# Patient Record
Sex: Female | Born: 1970 | Race: White | Hispanic: No | Marital: Married | Smoking: Never smoker
Health system: Southern US, Community
[De-identification: ages and names within clinical notes are randomized; demographics above are authoritative.]

## PROBLEM LIST (undated history)

## (undated) DIAGNOSIS — J329 Chronic sinusitis, unspecified: Secondary | ICD-10-CM

## (undated) DIAGNOSIS — J342 Deviated nasal septum: Secondary | ICD-10-CM

## (undated) DIAGNOSIS — R51 Headache: Secondary | ICD-10-CM

## (undated) DIAGNOSIS — K219 Gastro-esophageal reflux disease without esophagitis: Secondary | ICD-10-CM

## (undated) DIAGNOSIS — E785 Hyperlipidemia, unspecified: Secondary | ICD-10-CM

## (undated) DIAGNOSIS — T753XXA Motion sickness, initial encounter: Secondary | ICD-10-CM

## (undated) DIAGNOSIS — R519 Headache, unspecified: Secondary | ICD-10-CM

## (undated) DIAGNOSIS — I10 Essential (primary) hypertension: Secondary | ICD-10-CM

## (undated) DIAGNOSIS — T7840XA Allergy, unspecified, initial encounter: Secondary | ICD-10-CM

## (undated) DIAGNOSIS — E669 Obesity, unspecified: Secondary | ICD-10-CM

## (undated) DIAGNOSIS — J343 Hypertrophy of nasal turbinates: Secondary | ICD-10-CM

## (undated) DIAGNOSIS — G8929 Other chronic pain: Secondary | ICD-10-CM

## (undated) DIAGNOSIS — IMO0001 Reserved for inherently not codable concepts without codable children: Secondary | ICD-10-CM

## (undated) HISTORY — DX: Other chronic pain: G89.29

## (undated) HISTORY — PX: WISDOM TOOTH EXTRACTION: SHX21

## (undated) HISTORY — PX: COLONOSCOPY: SHX174

## (undated) HISTORY — DX: Headache: R51

## (undated) HISTORY — DX: Allergy, unspecified, initial encounter: T78.40XA

## (undated) HISTORY — DX: Gastro-esophageal reflux disease without esophagitis: K21.9

## (undated) HISTORY — DX: Headache, unspecified: R51.9

## (undated) HISTORY — DX: Hyperlipidemia, unspecified: E78.5

---

## 1998-02-13 ENCOUNTER — Other Ambulatory Visit: Admission: RE | Admit: 1998-02-13 | Discharge: 1998-02-13 | Payer: Self-pay | Admitting: Obstetrics and Gynecology

## 1998-04-15 ENCOUNTER — Encounter: Admission: RE | Admit: 1998-04-15 | Discharge: 1998-07-14 | Payer: Self-pay | Admitting: Internal Medicine

## 1999-02-23 ENCOUNTER — Other Ambulatory Visit: Admission: RE | Admit: 1999-02-23 | Discharge: 1999-02-23 | Payer: Self-pay | Admitting: Obstetrics and Gynecology

## 2000-02-01 ENCOUNTER — Other Ambulatory Visit: Admission: RE | Admit: 2000-02-01 | Discharge: 2000-02-01 | Payer: Self-pay | Admitting: Obstetrics and Gynecology

## 2000-08-18 ENCOUNTER — Inpatient Hospital Stay (HOSPITAL_COMMUNITY): Admission: AD | Admit: 2000-08-18 | Discharge: 2000-08-18 | Payer: Self-pay | Admitting: Obstetrics and Gynecology

## 2000-08-23 ENCOUNTER — Inpatient Hospital Stay (HOSPITAL_COMMUNITY): Admission: AD | Admit: 2000-08-23 | Discharge: 2000-08-26 | Payer: Self-pay | Admitting: Obstetrics and Gynecology

## 2000-09-25 ENCOUNTER — Other Ambulatory Visit: Admission: RE | Admit: 2000-09-25 | Discharge: 2000-09-25 | Payer: Self-pay | Admitting: Obstetrics and Gynecology

## 2001-10-29 ENCOUNTER — Other Ambulatory Visit: Admission: RE | Admit: 2001-10-29 | Discharge: 2001-10-29 | Payer: Self-pay | Admitting: Obstetrics and Gynecology

## 2002-11-06 ENCOUNTER — Other Ambulatory Visit: Admission: RE | Admit: 2002-11-06 | Discharge: 2002-11-06 | Payer: Self-pay | Admitting: Obstetrics and Gynecology

## 2003-11-25 ENCOUNTER — Other Ambulatory Visit: Admission: RE | Admit: 2003-11-25 | Discharge: 2003-11-25 | Payer: Self-pay | Admitting: Obstetrics and Gynecology

## 2004-10-26 ENCOUNTER — Ambulatory Visit: Payer: Self-pay | Admitting: Internal Medicine

## 2005-02-23 ENCOUNTER — Other Ambulatory Visit: Admission: RE | Admit: 2005-02-23 | Discharge: 2005-02-23 | Payer: Self-pay | Admitting: Obstetrics and Gynecology

## 2005-11-03 ENCOUNTER — Ambulatory Visit: Payer: Self-pay | Admitting: Family Medicine

## 2005-11-18 ENCOUNTER — Ambulatory Visit: Payer: Self-pay | Admitting: Family Medicine

## 2005-12-20 ENCOUNTER — Ambulatory Visit: Payer: Self-pay | Admitting: Internal Medicine

## 2006-05-12 LAB — CONVERTED CEMR LAB: Pap Smear: NORMAL

## 2006-08-15 ENCOUNTER — Ambulatory Visit: Payer: Self-pay | Admitting: Family Medicine

## 2006-08-15 DIAGNOSIS — F320 Major depressive disorder, single episode, mild: Secondary | ICD-10-CM | POA: Insufficient documentation

## 2006-08-15 DIAGNOSIS — R5381 Other malaise: Secondary | ICD-10-CM | POA: Insufficient documentation

## 2006-08-15 DIAGNOSIS — L659 Nonscarring hair loss, unspecified: Secondary | ICD-10-CM | POA: Insufficient documentation

## 2006-08-15 DIAGNOSIS — R5383 Other fatigue: Secondary | ICD-10-CM | POA: Insufficient documentation

## 2006-08-16 ENCOUNTER — Encounter: Payer: Self-pay | Admitting: Family Medicine

## 2006-08-16 LAB — CONVERTED CEMR LAB: Anti Nuclear Antibody(ANA): NEGATIVE

## 2006-08-22 ENCOUNTER — Ambulatory Visit: Payer: Self-pay | Admitting: Family Medicine

## 2007-05-03 ENCOUNTER — Encounter: Payer: Self-pay | Admitting: Family Medicine

## 2007-05-03 DIAGNOSIS — G43009 Migraine without aura, not intractable, without status migrainosus: Secondary | ICD-10-CM | POA: Insufficient documentation

## 2007-05-03 DIAGNOSIS — J309 Allergic rhinitis, unspecified: Secondary | ICD-10-CM | POA: Insufficient documentation

## 2007-05-11 ENCOUNTER — Ambulatory Visit: Payer: Self-pay | Admitting: Family Medicine

## 2007-05-11 DIAGNOSIS — Z6839 Body mass index (BMI) 39.0-39.9, adult: Secondary | ICD-10-CM | POA: Insufficient documentation

## 2007-05-11 DIAGNOSIS — Z6837 Body mass index (BMI) 37.0-37.9, adult: Secondary | ICD-10-CM | POA: Insufficient documentation

## 2007-05-11 DIAGNOSIS — J069 Acute upper respiratory infection, unspecified: Secondary | ICD-10-CM | POA: Insufficient documentation

## 2007-05-11 DIAGNOSIS — E66812 Obesity, class 2: Secondary | ICD-10-CM | POA: Insufficient documentation

## 2007-05-16 ENCOUNTER — Ambulatory Visit: Payer: Self-pay | Admitting: Family Medicine

## 2007-05-24 LAB — CONVERTED CEMR LAB
AST: 24 units/L (ref 0–37)
Bilirubin, Direct: 0.1 mg/dL (ref 0.0–0.3)
Cholesterol: 252 mg/dL (ref 0–200)
Direct LDL: 172.2 mg/dL
GFR calc Af Amer: 90 mL/min
GFR calc non Af Amer: 74 mL/min
Glucose, Bld: 94 mg/dL (ref 70–99)
HDL: 51.7 mg/dL (ref >39.0)
Sodium: 139 meq/L (ref 135–145)
Total CHOL/HDL Ratio: 4.9
Triglycerides: 114 mg/dL (ref 0–149)

## 2007-07-04 ENCOUNTER — Telehealth: Payer: Self-pay | Admitting: Family Medicine

## 2007-11-27 ENCOUNTER — Telehealth: Payer: Self-pay | Admitting: Family Medicine

## 2008-01-22 ENCOUNTER — Encounter: Payer: Self-pay | Admitting: Family Medicine

## 2008-02-18 ENCOUNTER — Telehealth: Payer: Self-pay | Admitting: Family Medicine

## 2008-04-02 ENCOUNTER — Ambulatory Visit: Payer: Self-pay | Admitting: Family Medicine

## 2008-04-02 DIAGNOSIS — R071 Chest pain on breathing: Secondary | ICD-10-CM | POA: Insufficient documentation

## 2008-07-14 ENCOUNTER — Telehealth: Payer: Self-pay | Admitting: Family Medicine

## 2009-05-12 LAB — CONVERTED CEMR LAB: Pap Smear: NORMAL

## 2009-07-30 ENCOUNTER — Ambulatory Visit: Payer: Self-pay | Admitting: Family Medicine

## 2009-07-30 DIAGNOSIS — K219 Gastro-esophageal reflux disease without esophagitis: Secondary | ICD-10-CM | POA: Insufficient documentation

## 2009-11-23 ENCOUNTER — Ambulatory Visit: Payer: Self-pay | Admitting: Family Medicine

## 2010-02-22 ENCOUNTER — Telehealth: Payer: Self-pay | Admitting: Family Medicine

## 2010-02-23 ENCOUNTER — Encounter: Payer: Self-pay | Admitting: Family Medicine

## 2010-03-09 ENCOUNTER — Ambulatory Visit: Payer: Self-pay | Admitting: Family Medicine

## 2010-03-09 ENCOUNTER — Telehealth: Payer: Self-pay | Admitting: Family Medicine

## 2010-05-13 NOTE — Assessment & Plan Note (Signed)
Summary: acid reflux/dlo 15 min   Vital Signs:  Patient profile:   40 year old female Height:      66.5 inches Weight:      221.8 pounds BMI:     35.39 Temp:     98.6 degrees F oral Pulse rate:   76 / minute Pulse rhythm:   regular BP sitting:   118 / 80  (left arm) Cuff size:   large  Vitals Entered By: Benny Lennert CMA Duncan Dull) (July 30, 2009 9:03 AM)  History of Present Illness: Chief complaint acid reflux    Heartburn      This is a 40 year old woman who presents with Heartburn.  The patient complains of acid reflux, sour taste in mouth, and chest pain, but denies epigastric pain.  The patient denies the following alarm features: melena, dysphagia, hematemesis, and vomiting.  Symptoms are worse with lying down.     ROS: GEN: No acute illnesses, no fevers, chills, sweats, fatigue, weight loss, or URI sx. Pulm: No SOB, cough, wheezing has occ B arm tingling Interactive and getting along well at home.  Otherwise, ROS is as per the HPI.   GEN: Well-developed,well-nourished,in no acute distress; alert,appropriate and cooperative throughout examination HEENT: Normocephalic and atraumatic without obvious abnormalities. No apparent alopecia or balding. Ears, externally no deformities ABD: s, nt, nd, +bs, no rebound PULM: Breathing comfortably in no respiratory distress EXT: No clubbing, cyanosis, or edema PSYCH: Normally interactive. Cooperative during the interview. Pleasant. Friendly and conversant. Not anxious or depressed appearing. Normal, full affect.   Allergies (verified): No Known Drug Allergies   Impression & Recommendations:  Problem # 1:  GERD (ICD-530.81) Zantac first if not working, then prilosec or prevacid reviewed diet and mechanical changes  Diagnostics Reviewed:  Discussed lifestyle modifications, diet, antacids/medications, and preventive measures. Handout provided.   Complete Medication List: 1)  Multivitamins Tabs (Multiple vitamin) ....  (sometimes) 2)  Nasonex 50 Mcg/act Susp (Mometasone furoate) .... Daily 3)  Lo/ovral 0.3-30 Mg-mcg Tabs (Norgestrel-ethinyl estradiol) .... Once daily 4)  Claritin 10 Mg Tabs (Loratadine) .... Daily  Current Allergies (reviewed today): No known allergies

## 2010-05-13 NOTE — Medication Information (Signed)
Summary: Request for Prior Authorization-Omeprazole  Request for Prior Authorization-Omeprazole   Imported By: Maryln Gottron 04/15/2010 12:28:55  _____________________________________________________________________  External Attachment:    Type:   Image     Comment:   External Document

## 2010-05-13 NOTE — Assessment & Plan Note (Signed)
Summary: F/U [facility] ER ON 11/02/09/[facility]   Vital Signs:  Patient profile:   40 year old female Height:      66.5 inches Weight:      226.0 pounds BMI:     36.06 Temp:     98.8 degrees F oral Pulse rate:   76 / minute Pulse rhythm:   regular BP sitting:   120 / 90  (left arm) Cuff size:   large  Vitals Entered By: Benny Lennert CMA Duncan Dull) (November 23, 2009 12:05 PM)  History of Present Illness: Chief complaint follow up [facility] hospital er on 11-02-2009 (chest pain)  Seen in ER on 7/25 for chest pain.  Labs, EKG, CXR nml. LFTs nml for pt. Poor  control on ranitidine and carafate. Significant improvement with GI cocktail.  Continuing to have central chest pain...worst at night. Worse when lying down at night. Woke her up at 3:30 last night. Bloated, epigastric ttp. No exertional chest pain.   Sour taste in throat, burping acid.  No clear trigger..but last night did have [place] cooking.Marland Kitchengreasy.  Lasting 20 min at a time. No clear RUQ pain.   Pepcid AC not helping.   Problems Prior to Update: 1)  Gerd  (ICD-530.81) 2)  Chest Wall Pain, Anterior  (ICD-786.52) 3)  Screening For Lipoid Disorders  (ICD-V77.91) 4)  Uri  (ICD-465.9) 5)  Overweight  (ICD-278.02) 6)  Iron Deficiency, Childhood  (ICD-280.9) 7)  Migraine Headache  (ICD-346.90) 8)  Allergic Rhinitis  (ICD-477.9) 9)  Fatigue, Chronic  (ICD-780.79) 10)  Depressive [place]  (ICD-296.21) 11)  Hair Loss  (ICD-704.00)  Current Medications (verified): 1)  Multivitamins   Tabs (Multiple Vitamin) .... (Sometimes) 2)  Nasonex 50 Mcg/act Susp (Mometasone Furoate) .... Daily 3)  Lo/ovral 0.3-30 Mg-Mcg Tabs (Norgestrel-Ethinyl Estradiol) .... Once Daily 4)  Claritin 10 Mg Tabs (Loratadine) .... Daily 5)  Carafate 1 Gm Tabs (Sucralfate) .... Take One Tab Before Meals 6)  Omeprazole 40 Mg Cpdr (Omeprazole) .Marland Kitchen.. 1 Tab By Mouth Daily .Marland Kitchen.for 4-6 Weeks Then Taper Off.  Allergies (verified): No  Known Drug Allergies  Past History:  Past medical, surgical, family and social histories (including risk factors) reviewed, and no changes noted (except as noted below).  Past Medical History: Reviewed history from 05/03/2007 and no changes required. Allergic rhinitis  Past Surgical History: Reviewed history from 05/11/2007 and no changes required. 2002        C-Section 10/2004     Head CT (-) plantar fascia accidentally torn   Family History: Reviewed history from 05/03/2007 and no changes required. Father: Alive 77, arthritis, Elev. thyroid Mother: Alive 22's, manic depression, bipolar Siblings: 1 sister, 55 years old, ? bipolar DM:  Paternal GM Arthritis:  Father Breast CA:  Maternal GM Depression:  Bipolar (maternal family)  Social History: Reviewed history from 05/03/2007 and no changes required. Former Smoker, teen years Alcohol use-yes, occasionally Drug use-no Regular exercise-yes, occasionally Marital Status: Married x 14-1/2 years, no abuse Children: 75 year old, 38 year old Occupation: Runner, broadcasting/film/video, Kindergarten, Sports coach, BA in Education Diet:  3 meals daily, varied, no FF, social ETOH, no tob. abuse, (+) hx/complex maj.  Review of Systems       No SOB, no cough,  General:  Denies fatigue and fever. ENT:  Denies hoarseness. CV:  Denies swelling of feet. Resp:  Denies shortness of breath, sputum productive, and wheezing. GU:  Denies dysuria.  Physical Exam  General:  overweight appearing female in NAD  Mouth:  Oral mucosa and oropharynx without lesions or exudates.  Teeth in good repair. Neck:  no carotid bruit or thyromegaly no cervical or supraclavicular lymphadenopathy  Lungs:  Normal respiratory effort, chest expands symmetrically. Lungs are clear to auscultation, no crackles or wheezes. Heart:  Normal rate and regular rhythm. S1 and S2 normal without gallop, murmur, click, rub or other extra sounds. Abdomen:  ttp over epigastrum and  substernallyno distention, no masses, no guarding, no rigidity, no rebound tenderness, no abdominal hernia, no inguinal hernia, no hepatomegaly, and no splenomegaly.   Pulses:  R and L posterior tibial pulses are full and equal bilaterally  Extremities:  no edema  Skin:  Intact without suspicious lesions or rashes Psych:  Cognition and judgment appear intact. Alert and cooperative with normal attention span and concentration. No apparent delusions, illusions, hallucinations   Impression & Recommendations:  Problem # 1:  CHEST WALL PAIN, ANTERIOR (ICD-786.52) Most consistent with GERD. But if not improving with below treatment , consider gallbladder source. \Reviewed ER records in detail.Marland Kitchenno clear cardiac or pulm source.   Problem # 2:  GERD (ICD-530.81) Avoid acidic foods..caffeine, chocolate, tomato, citris, peppermint. Decrease greasy foods, fatty foods. Start omeprozole 40 mg daily  for 4-6 weeks then taper off.  If no improvement in 1-2 week make appt for [place] of gallbladder and change in medicine.  Her updated medication list for this problem includes:    Carafate 1 Gm Tabs (Sucralfate) .Marland Kitchen... Take one tab before meals    Omeprazole 40 Mg Cpdr (Omeprazole) .Marland Kitchen... 1 tab by mouth daily .Marland Kitchen.for 4-6 weeks then taper off.  Complete Medication List: 1)  Multivitamins Tabs (Multiple vitamin) .... (sometimes) 2)  Nasonex 50 Mcg/act Susp (Mometasone furoate) .... Daily 3)  Lo/ovral 0.3-30 Mg-mcg Tabs (Norgestrel-ethinyl estradiol) .... Once daily 4)  Claritin 10 Mg Tabs (Loratadine) .... Daily 5)  Carafate 1 Gm Tabs (Sucralfate) .... Take one tab before meals 6)  Omeprazole 40 Mg Cpdr (Omeprazole) .Marland Kitchen.. 1 tab by mouth daily .Marland Kitchen.for 4-6 weeks then taper off.  Patient Instructions: 1)  Avoiding acidic foods..caffeine, chocolate, tomato, citris, peppermint. 2)  Decrease greasy foods, fatty foods. 3)  Start omeprozole 40 mg daily  for 4-6 weeks then taper off.  4)  If no improvement in 1-2 week for  [place] of gallbladder and change in medicine.  5)   Fasting lipids, CMET Dx v77.91  Prescriptions: OMEPRAZOLE 40 MG CPDR (OMEPRAZOLE) 1 tab by mouth daily .Marland Kitchen.for 4-6 weeks then taper off.  #30 x 3   Entered and Authorized by:   Kerby Nora MD   Signed by:   Kerby Nora MD on 11/23/2009   Method used:   Electronically to        [id number]       [address]       [place], [place]  [address]       Ph: [phone]       Fax: [phone]   RxID:   [id number]   Current Allergies (reviewed today): No known allergies   Last PAP:  Normal (05/12/2006 3:26:48 PM) PAP Result Date:  05/12/2009 PAP Result:  normal PAP Next Due:  1 yr Last Mammogram:  Normal (04/12/2003 2:32:17 PM) Mammogram Result Date:  05/12/2009 Mammogram Result:  normal Mammogram Next Due:  1 yr

## 2010-05-13 NOTE — Progress Notes (Signed)
Summary: prior auth needed for omeprazole  Phone Note From Pharmacy   [facility]  [address] [id number]  [facility] Summary of Call: Prior Berkley Harvey is needed for omeprazole, form is on your desk. Initial call taken by: Lowella Petties CMA, AAMA,  February 22, 2010 3:44 PM     Appended Document: prior auth needed for omeprazole Spoke with [facility] rep today, prior Berkley Harvey was given for omeprazole, good through 02/23/11.

## 2010-05-13 NOTE — Progress Notes (Signed)
Summary: Stepped on pin (pt is here now)  Phone Note Call from Patient   Caller: Patient Call For: Kerby Nora MD Summary of Call: At 7am this morning pt put on her boot and an old [employer] uniform pin was in the boot. Pt stepped on pin which was 3/4" long. It went all the way into pt's left foot. Pt said the pin is old but she is not sure if had rust on it or not.Pt said it is sore to bear weight on her foot and is tender if she pushes around where pin went in. Pt is not sure if she needs to be seen and is not sure when her last tetanus shot was. Pt is here now. Please advise.  Initial call taken by: Lewanda Rife LPN,  March 09, 2010 4:46 PM  Follow-up for Phone Call        Tetanus given today. No DM. No prophylactic antibiotics needed.  Have pt wash area warm soapy water daily , apply bandaid/neoporin daily.  Follow up in 3 days for re-eval.  Follow up sooner if any redness or discharge at site.  Follow-up by: Kerby Nora MD,  March 09, 2010 4:54 PM  Additional Follow-up for Phone Call Additional follow up Details #1::        Patient advised as instructed while here in the office.  Tetanus shot given while here today.  Appt made for Friday with Dr. Sharen Hones at 4:15. Additional Follow-up by: Linde Gillis CMA Duncan Dull),  March 09, 2010 5:03 PM      Immunizations Administered:  Tetanus Vaccine:    Vaccine Type: Tdap    Site: left deltoid    Mfr: GlaxoSmithKline    Dose: 0.5 ml    Route: IM    Given by: Linde Gillis CMA (AAMA)    Exp. Date: 01/29/2012    Lot #: [id number]    VIS given: 02/27/08 version given March 09, 2010.

## 2010-08-27 NOTE — Discharge Summary (Signed)
[facility]  Patient:    Jane Li, Jane Li                      MRN: [id number] Adm. Date:  [id number] Disc. Date: [id number] Attending:  Rhina Brackett Dictator:   Danie Chandler, R.N.                           Discharge Summary  ADMITTING DIAGNOSES:            1. Intrauterine pregnancy at term.                                 2. Fetal macrosomia.                                 3. Declined trial of labor.  DISCHARGE DIAGNOSES:            1. Intrauterine pregnancy at term.                                 2. Fetal macrosomia.                                 3. Declined trial of labor.  PROCEDURE:                      On Aug 23, 2000, primary low transverse cesarean section.  REASON FOR ADMISSION:           Please see dictated H&P.  HOSPITAL COURSE:                The patient was taken to the operating room and underwent the above named procedure without complication.  This was productive of a viable female infant with Apgars of 8 at one minute and 9 at five minutes.  Postoperatively on day #1, the patients hemoglobin was 10.1, hematocrit 29.1 and white blood cell count 11.0.  The patient had good return of bowel function on this day and on postoperative day #2 she was tolerating a regular diet and had good pain control and was ambulating well without difficulty.  She was discharged home no postoperative day #3.  DISCHARGE CONDITION:            Good.  DIET:                           Regular as tolerated.  ACTIVITY:                       No heavy lifting, no driving, no vaginal entry.  FOLLOW-UP:                      She is to follow up in the office in one to two weeks for incision check, and she is to call for temperature greater than 100 degrees, persistent nausea and vomiting, heavy vaginal bleeding, and/or redness and drainage from the incision site.  DISCHARGE MEDICATIONS:          1. Prenatal vitamin one p.o. q.d.  2. Tylox as directed by M.D. DD:  09/07/00 TD:  09/07/00 Job: [id number]

## 2010-08-27 NOTE — Assessment & Plan Note (Signed)
[facility]                             [facility] OFFICE NOTE   Jane Li, Jane Li                      MRN:          [id number]  DATE:11/03/2005                            DOB:          [birth year]    CHIEF COMPLAINT:  A 40 year old white female here to establish a new doctor.   HISTORY OF PRESENT ILLNESS:  Jane Li had previously seen Dr. Arlana Pouch in  [place], but was unhappy with his care and would like to transfer care to me.  She states that she has multiple medical issues that she would like to  discuss that are listed below:  1.  Frequent headaches associated with occasional nausea and photophobia.      She takes ibuprofen to help this which it does some.  She states she is      having these headaches every 3 to 4 days.  She has had them for many      years, but they are bothering her significantly now.  She has never had      any diagnosis of migraine or previous migraine treatment.  See review of      systems for further details.  2.  Allergic rhinitis.  3.  Decreased energy:  Jane Li states that for over a year she has had      consistent fatigue.  She states she does not have any desire to do      things that used to be pleasurable for her.  She denies anxiety or      depression.  She does have a family history of bipolar disorder.  She      would like to discuss this in detail.  4.  Irregular bowel habits:  Jane Li stated that for the past few months      she has had alternating diarrhea with constipation.  She denies any      blood in her stool or abdominal pain or fever.   PAST MEDICAL HISTORY:  1.  Childhood iron deficiency.  2.  Allergic rhinitis.   HOSPITALIZATIONS AND SURGERIES:  1.  In 1997, a normal spontaneous vaginal delivery.  No complications.  2.  In 2002, C-section.   ALLERGIES:  NO KNOWN DRUG ALLERGIES.   MEDICATIONS:  1.  Flonase two sprays per nostril daily.  2.  Fexofenadine 180 mg daily.  3.   Lo/Ovral 21 one daily.  4.  Nature's Diet capsules one in the morning and one at lunch plus herbs.  5.  Multivitamin daily.  6.  Vitamin B complex daily.   FAMILY HISTORY:  Father alive at age 16 with arthritis and possible  hyperthyroidism.  Mother alive at age 78 with bipolar disorder.  Parental  grandmother with diabetes.  Maternal grandmother with breast cancer.  No  first-degree family members with breast cancer.  Depression and bipolar  disorder in several people in the maternal side of her family.  She has one  sister who is 64 years old with possible bipolar disorder.   SOCIAL HISTORY:  She works as a Midwife at [employer].  She has a B.A. in education.  She has been married for 14-1/2 years without  any sexual or domestic abuse.  She has two children, one 8 years old and one  78 years old.  She states that it is very stressful for her to work all day  and then come home and have to take care of her children.  Her diet is very  varied and she states that it is healthy with three meals per day.  She has  a history of social alcohol use.  No tobacco use.  Remote history of  marijuana use.  No IV drug use.   REVIEW OF SYSTEMS:  Frequent sinus and tension headaches.  She does not wear  glasses.  No change in hearing.  Occasional dyspnea.  Occasional brief chest  pain.  No cough.  Occasional nausea.  Irregular bowel habits alternating  with diarrhea and constipation.  She is G2 P2.  Dry skin.  Decreased libido.  Flooded temperature.  Joint pain diffusely and neck ache.  Rhinitis.   PHYSICAL EXAMINATION:  VITAL SIGNS:  Weight 199, blood pressure 142/100,  pulse 68, temperature 98.4, height 66-1/2, putting BMI at 32.  GENERAL:  Overweight-appearing female in no apparent distress.  Appropriate  affect.  HEENT:  PERRLA.  No papilledema.  Extraocular muscles intact.  Tympanic  membranes clear.  Oropharynx clear.  Nares patent and clear.  No  lymphadenopathy.  No  thyromegaly.  PULMONARY:  Clear to auscultation bilaterally.  No wheezes, rales or  rhonchi.  CARDIOVASCULAR:  Regular rate and rhythm.  No murmurs, rubs or gallops.  GASTROINTESTINAL:  Soft, nontender, normoactive bowel sounds.  No  hepatosplenomegaly.  MUSCULOSKELETAL:  Strength 5/5 in upper and lower extremities.  Range of  motion within normal limits.  Non-antalgic gait.  NEUROLOGIC:  Cranial nerves II-XII grossly intact.  Sensation intact in  upper and lower extremities.  Reflexes 2+ bicipital and patellar.  Pulses  2+.   ASSESSMENT:  1.  Fatigue and anhedonia:  Possible anxiety and depression.  2.  Frequent headaches:  Likely migraine.  3.  Irregular bowel habits:  Possible irritable bowel syndrome.  4.  Allergic rhinitis.  5.  Elevated blood pressure.   PLAN:  I will obtain Jane Li records from her gynecologist as well as  her previous doctor.  She will be given information on migraines and a  headache diary.  She will continue ibuprofen p.r.n. headache.  I feel that a  lot of her symptoms may be secondary to stress.  At our next visit once I  have reviewed her old records, we will consider a workup for fatigue unless  labs have been done recently.  She does have a family history of bipolar  disorder as well as thyroid  disorder, and these both have to be considered.  She will return in 2 to 3  weeks or earlier depending upon the arrival of her records.                                   Kerby Nora, MD   AB/MedQ  DD:  11/03/2005  DT:  11/03/2005  Job #:  [id number]

## 2010-08-27 NOTE — H&P (Signed)
[facility]  Patient:    Jane Li, Jane Li                        MRN: [id number] Adm. Date:  08/23/00 Attending:  Duke Salvia. Marcelle Overlie, M.D.                         History and Physical  CHIEF COMPLAINT:              Breech/transverse presentation at term, fetal macrosomia.  HISTORY OF PRESENT ILLNESS:   A 40 year old G2, P88.  EDD May 19.  This patient had a difficult first vaginal delivery of pushing three and a half to four hours with epidural, delivering a 9 pound 3 ounce female.  Was seen recently Aug 15, 2000.  Ultrasound showed AFI 95th percentile with EFW 10.2 pounds. Most of the measurements were off the chart.  Noted to be breech/transverse lie.  She declines ECV due to the suspected macrosomia and presents now for primary cesarean section.  This procedure including risks of bleeding, infection, transfusion, adjacent organ injury all reviewed with her and cesarean remains her preference.  PRENATAL LABORATORIES:        Group B strep screen was negative.  Blood type A+.  Rubella titer positive.  One hour GTT 135.  ALLERGIES:                    None.  PAST SURGICAL HISTORY:        None.  PAST OBSTETRICAL HISTORY:     One vaginal delivery in 1997 of a 9 pound 3 ounce female.  History of chlamydia treated in the past.  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  PHYSICAL EXAMINATION  VITAL SIGNS:                  Temperature 98.1, blood pressure 120/80.  HEENT:                        Unremarkable.  NECK:                         Supple without masses.  LUNGS:                        Clear.  CARDIOVASCULAR:               Regular rate and rhythm without murmurs, rubs, or gallops.  BREASTS:                      Not examined.  ABDOMEN:                      A 42 cm fundal height.  Fetal heart rate 140s.  PELVIC:                       Cervix was closed, was breech by ______.  EXTREMITIES:                  Unremarkable.  NEUROLOGIC:                    Unremarkable.  IMPRESSION:                   1. Term intrauterine pregnancy.  2. Breech presentation/transverse lie with fetal                                  macrosomia.  PLAN:                         Primary cesarean section.  Procedure and risks discussed as above. DD:  08/21/00 TD:  08/21/00 Job: [id number]

## 2010-08-27 NOTE — Op Note (Signed)
[facility]  Patient:    Jane Li, Jane Li                      MRN: [id number] Proc. Date: 08/23/00 Adm. Date:  [id number] Attending:  Rhina Brackett                           Operative Report  PREOPERATIVE DIAGNOSIS:       Term intrauterine pregnancy, fetal macrosomia,                               declined trial of labor.  POSTOPERATIVE DIAGNOSIS:      Term intrauterine pregnancy, fetal macrosomia,                               declined trial of labor.  OPERATION:                    Primary low transverse cesarean section.  SURGEON:                      Duke Salvia. Marcelle Overlie, M.D.  ANESTHESIA:                   Spinal.  COMPLICATIONS:                None.  DRAINS:                       Foley catheter.  ESTIMATED BLOOD LOSS:         800 cc.  DESCRIPTION OF PROCEDURE AND FINDINGS:                     The patient had been breech to transverse presentation with an estimated fetal weight of 10.5 pounds.  She had a long second stage with her first vaginal delivery, and was scheduled for primary cesarean section, both for concerns about macrosomia and malpresentation. Ultrasound immediately preoperative demonstrated a vertex presentation, but she again declined a trial of labor due to the estimated fetal weight.  The patient was prepped and draped in the left tilt position.  Foley catheter positioned after spinal anesthetic was obtained.  A transverse Pfannenstiel incision was made two fingerbreadths above the symphysis, and carried down to the fascia which was incised and extended transversely.  The rectus muscles were divided in the midline.  The peritoneum entered superiorly without incident and extended in a vertical manner.  The vesicouterine serosa was then incised, and the bladder was bluntly and sharply dissected off of the lower uterine segment.  The bladder blade was positioned.  A transverse incision was made in the lower segment  and extended with blunt dissection.  Clear fluid was noted.  The patient delivered of a 9 pound and 10 ounces female.  Apgars were 8 and 9.  The infant was suctioned, cord clamped, and passed to the pediatric team for further care.  The placenta delivered manually intact.  The uterus exteriorized and cavity wiped clean with laparotomy packs.  Closure obtained with first layer of 0 chromic in a locked fashion following an imbricating layer of 0 chromic.  This was hemostatic.  The bladder flap was intact and hemostatic.  Tubes and ovaries were normal.  Prior to closure, sponge,  needle, and instrument counts were reported as correct x 2.  The rectus muscles were reapproximated with a 3-0 Dexon interrupted suture.  The fascia was closed laterally to midline on either side with a 0 Dexon running suture.  The subcutaneous fat was hemostatic.  Clips and Steri-Strips were used on the skin.  She tolerated this well and went to the recovery room in good condition. Clear urine noted at the end of the case.  She received Ancef 1 g IV after the cord was clamped and Pitocin IV. DD:  08/23/00 TD:  08/23/00 Job: [id number]

## 2010-10-23 ENCOUNTER — Other Ambulatory Visit: Payer: Self-pay | Admitting: Family Medicine

## 2010-11-01 ENCOUNTER — Encounter: Payer: Self-pay | Admitting: Family Medicine

## 2010-11-02 ENCOUNTER — Ambulatory Visit (INDEPENDENT_AMBULATORY_CARE_PROVIDER_SITE_OTHER): Payer: BC Managed Care – PPO | Admitting: Family Medicine

## 2010-11-02 ENCOUNTER — Telehealth: Payer: Self-pay | Admitting: *Deleted

## 2010-11-02 ENCOUNTER — Encounter: Payer: Self-pay | Admitting: Family Medicine

## 2010-11-02 DIAGNOSIS — R071 Chest pain on breathing: Secondary | ICD-10-CM

## 2010-11-02 DIAGNOSIS — R51 Headache: Secondary | ICD-10-CM

## 2010-11-02 DIAGNOSIS — R519 Headache, unspecified: Secondary | ICD-10-CM | POA: Insufficient documentation

## 2010-11-02 DIAGNOSIS — K219 Gastro-esophageal reflux disease without esophagitis: Secondary | ICD-10-CM

## 2010-11-02 DIAGNOSIS — G43909 Migraine, unspecified, not intractable, without status migrainosus: Secondary | ICD-10-CM

## 2010-11-02 DIAGNOSIS — R42 Dizziness and giddiness: Secondary | ICD-10-CM

## 2010-11-02 MED ORDER — ALPRAZOLAM 0.5 MG PO TABS: ORAL_TABLET | ORAL | Status: DC

## 2010-11-02 MED ORDER — ESOMEPRAZOLE MAGNESIUM 40 MG PO CPDR: 40.0000 mg | DELAYED_RELEASE_CAPSULE | Freq: Every day | ORAL | Status: DC

## 2010-11-02 NOTE — Assessment & Plan Note (Signed)
Possible recurrence of migraine , but given previously migraine free for years on no medication, with gradual onset of worsening headache daily, no severe as well as dizziness, and skin sensitivity, nausea....will eval with MRI brain to rule out secondary cause of headache. Neuro exam today is normal. Also pt overusing OTC med.. Stop these to avoid rebound headache. Pt does have morning HA and snores.. If MRI nml and treatment of migraines not helping.. Consider sleep study eval for sleep apnea.

## 2010-11-02 NOTE — Progress Notes (Signed)
  Subjective:    Patient ID: Jane Li, female    DOB: 09-Nov-1970, 40 y.o.   MRN: [id number]  HPI    Review of Systems  Constitutional: Negative for fever and fatigue.  HENT: Negative for ear pain.   Eyes: Negative for pain.  Respiratory: Negative for chest tightness and shortness of breath.   Cardiovascular: Negative for chest pain, palpitations and leg swelling.  Gastrointestinal: Negative for abdominal pain.  Genitourinary: Negative for dysuria.  Neurological: Negative for dizziness, tremors, syncope, weakness, light-headedness and numbness.       Objective:   Physical Exam  Constitutional: Vital signs are normal. She appears well-developed and well-nourished. She is cooperative.  Non-toxic appearance. She does not appear ill. No distress.  HENT:  Head: Normocephalic.  Right Ear: Hearing, tympanic membrane, external ear and ear canal normal. Tympanic membrane is not erythematous, not retracted and not bulging.  Left Ear: Hearing, tympanic membrane, external ear and ear canal normal. Tympanic membrane is not erythematous, not retracted and not bulging.  Nose: No mucosal edema or rhinorrhea. Right sinus exhibits no maxillary sinus tenderness and no frontal sinus tenderness. Left sinus exhibits no maxillary sinus tenderness and no frontal sinus tenderness.  Mouth/Throat: Uvula is midline, oropharynx is clear and moist and mucous membranes are normal.  Eyes: Conjunctivae, EOM and lids are normal. Pupils are equal, round, and reactive to light. No foreign bodies found.  Neck: Trachea normal and normal range of motion. Neck supple. Carotid bruit is not present. No mass and no thyromegaly present.  Cardiovascular: Normal rate, regular rhythm, S1 normal, S2 normal, normal heart sounds, intact distal pulses and normal pulses.  Exam reveals no gallop and no friction rub.   No murmur heard. Pulmonary/Chest: Effort normal and breath sounds normal. Not tachypneic. No respiratory distress.  She has no decreased breath sounds. She has no wheezes. She has no rhonchi. She has no rales.  Abdominal: Soft. Normal appearance and bowel sounds are normal. There is no tenderness.  Neurological: She is alert. She has normal strength and normal reflexes. She displays no atrophy and no tremor. No cranial nerve deficit or sensory deficit. She exhibits normal muscle tone. She displays a negative Romberg sign. She displays no seizure activity. Coordination and gait normal. GCS eye subscore is 4. GCS verbal subscore is 5. GCS motor subscore is 6.  Skin: Skin is warm, dry and intact. No rash noted.  Psychiatric: Her speech is normal and behavior is normal. Judgment and thought content normal. Her mood appears not anxious. Cognition and memory are normal. She does not exhibit a depressed mood.          Assessment & Plan:

## 2010-11-02 NOTE — Telephone Encounter (Signed)
Patient advised and rx called to pharmacy  

## 2010-11-02 NOTE — Patient Instructions (Signed)
Stop ibuprofen. Use tylenol for headache only. Stop omeprazole, change to nexium 40 mg daily. Work on low acid diet, avoid caffeine, alcohol, citris fruit. Stop at front desk to get referral for MRI brain to evaluate headaches.  We will call you with further recommendations for headaches following results of this study.

## 2010-11-02 NOTE — Assessment & Plan Note (Signed)
Poor control. Likely due to daily ibuprofen she is taking due to headache.  Stop ibuprofen.   Stop omeprazole.. Change to pantoprazole daily. Call if symptoms not improved in 2 weeks.

## 2010-11-02 NOTE — Progress Notes (Signed)
  Subjective:    Patient ID: Jane Li, female    DOB: September 28, 1970, 40 y.o.   MRN: [id number]  HPI  40 year old presents with multiple issues.  She reports that  chect pain from GERD... intially better with PPI.  Past cardiac work up.. EKG, CXR labs.  She cannot taper off omeprazole 40 mg daily despite lifestyle changes. Symtpoms are even worse now.. Burning in chest, pain in RUQ intermitantly after meals.  Migraine headaches are back in past 4 months (hormaonal migraine had gone away previously with continuous OCPs)... More frequent, occuring daily. Using ibuprofen almost daily. Pain in head in different areas each time. Occ nausea, photo,phonophobia.  Wakes up with headache in AM. She does snore at night.. Has never had sleep study, no BP issues Neck stiffness.. Feels golf ball size swelling at base of neck... X-rays nml. Seeing chiropractor, which helps some.  Occ dizziness, no balance issues. She does feel like memory has gotten worse recently. No numbness, no weakness. She does feel like her skin is extra sensitive.  Recent nml eye exam.  Recent ENT visit shows septum deviation, no other issues.  Review of Systems     Objective:   Physical Exam        Assessment & Plan:

## 2010-11-02 NOTE — Telephone Encounter (Signed)
Patient is going for mri of the brain and is very claustrophobic and would like something called to pharmacy for this

## 2010-11-04 ENCOUNTER — Ambulatory Visit
Admission: RE | Admit: 2010-11-04 | Discharge: 2010-11-04 | Disposition: A | Discharge status: Home / Self Care | Payer: BC Managed Care – PPO | Source: Ambulatory Visit | Attending: Family Medicine | Admitting: Family Medicine

## 2010-11-04 ENCOUNTER — Telehealth: Payer: Self-pay | Admitting: Family Medicine

## 2010-11-04 DIAGNOSIS — R519 Headache, unspecified: Secondary | ICD-10-CM

## 2010-11-04 DIAGNOSIS — R42 Dizziness and giddiness: Secondary | ICD-10-CM

## 2010-11-04 MED ORDER — SUMATRIPTAN SUCCINATE 100 MG PO TABS: ORAL_TABLET | ORAL | Status: DC

## 2010-11-04 MED ORDER — TOPIRAMATE 25 MG PO TABS: ORAL_TABLET | ORAL | Status: DC

## 2010-11-04 NOTE — Telephone Encounter (Signed)
Spoke with Pt regarding MRI results.. Nml.  She will proceed with topamax for migraine prevention. Imitrex for serve acute migraine treatment . She has stopped ibuprofen, She is okay with 1 month follow up... Please call her to arrange follow up in 1 month.  If not better at that time, we can consider sleep study.

## 2010-11-05 NOTE — Telephone Encounter (Signed)
Spoke with patient and she would like to call back when she is near her calande

## 2010-11-14 ENCOUNTER — Other Ambulatory Visit: Payer: Self-pay | Admitting: Family Medicine

## 2010-12-03 ENCOUNTER — Encounter: Payer: Self-pay | Admitting: Family Medicine

## 2010-12-03 ENCOUNTER — Ambulatory Visit (INDEPENDENT_AMBULATORY_CARE_PROVIDER_SITE_OTHER): Payer: BC Managed Care – PPO | Admitting: Family Medicine

## 2010-12-03 VITALS — BP 120/72 | HR 76 | Temp 98.4°F | Ht 67.0 in | Wt 226.1 lb

## 2010-12-03 DIAGNOSIS — R51 Headache: Secondary | ICD-10-CM

## 2010-12-03 DIAGNOSIS — G43909 Migraine, unspecified, not intractable, without status migrainosus: Secondary | ICD-10-CM

## 2010-12-03 DIAGNOSIS — R1013 Epigastric pain: Secondary | ICD-10-CM

## 2010-12-03 DIAGNOSIS — R519 Headache, unspecified: Secondary | ICD-10-CM

## 2010-12-03 LAB — COMPREHENSIVE METABOLIC PANEL
BUN: 13 mg/dL (ref 6–23)
CO2: 25 mEq/L (ref 19–32)
Creatinine, Ser: 0.9 mg/dL (ref 0.4–1.2)
GFR: 70.97 mL/min (ref >60.00)
Glucose, Bld: 96 mg/dL (ref 70–99)
Total Bilirubin: 0.3 mg/dL (ref 0.3–1.2)

## 2010-12-03 LAB — LIPASE: Lipase: 31 U/L (ref 11.0–59.0)

## 2010-12-03 MED ORDER — TOPIRAMATE 50 MG PO TABS: 50.0000 mg | ORAL_TABLET | Freq: Every day | ORAL | Status: DC

## 2010-12-03 NOTE — Patient Instructions (Addendum)
Increase topamax to 50 mg at bedtime for migraine. Use imitrex for acute severe headache. Stop by lab on your way out. Okay to change back to omeprazole 40 mg daily if cheaper option and working as well as nexium.

## 2010-12-03 NOTE — Assessment & Plan Note (Signed)
Continue trigger avoidance...increase topamax to 50 mg daily. Follow up in 1 month.

## 2010-12-03 NOTE — Progress Notes (Signed)
  Subjective:    Patient ID: Jane Li, female    DOB: Dec 30, 1970, 40 y.o.   MRN: [id number]  HPI  Migraine: Nml MRI last month. We discussed holding ibuprofen as they may be causing GERD and stomach irritation as well as rebound headache. She has started back  topamax 25 mg daily about 20 days. NO SE associated, taking at night. She reports headaches since then occuring less frequently. Having breaks of a week between headaches. Using imitrex for acute headache, made headache more manageable, second dose helped it resolve. HAs increased exercsie.  GERD.Marland Kitchen She has not noted any  improved symptoms off ibuprofen and on Nexium x 1 month. Omeprazole 40 did not help either.  Nexium was costly.. Would like different alternative. Continued burning in chest, sour taste in mouth, occ feels like food moving difficulty through chest. Has been trying to avoid triggers.  Still having some epigastric pain...constantly present.      Review of Systems  Constitutional: Negative for fever and fatigue.  HENT: Negative for ear pain.   Eyes: Negative for pain.  Respiratory: Negative for chest tightness and shortness of breath.   Cardiovascular: Negative for chest pain, palpitations and leg swelling.  Gastrointestinal: Positive for nausea. Negative for abdominal pain, diarrhea, constipation, blood in stool and abdominal distention.  Genitourinary: Negative for dysuria.  Neurological: Positive for headaches. Negative for tremors, seizures, syncope, speech difficulty and weakness.       Objective:   Physical Exam  Constitutional: Vital signs are normal. She appears well-developed and well-nourished. She is cooperative.  Non-toxic appearance. She does not appear ill. No distress.  HENT:  Head: Normocephalic.  Right Ear: Hearing, tympanic membrane, external ear and ear canal normal. Tympanic membrane is not erythematous, not retracted and not bulging.  Left Ear: Hearing, tympanic membrane, external  ear and ear canal normal. Tympanic membrane is not erythematous, not retracted and not bulging.  Nose: No mucosal edema or rhinorrhea. Right sinus exhibits no maxillary sinus tenderness and no frontal sinus tenderness. Left sinus exhibits no maxillary sinus tenderness and no frontal sinus tenderness.  Mouth/Throat: Uvula is midline, oropharynx is clear and moist and mucous membranes are normal.  Eyes: Conjunctivae, EOM and lids are normal. Pupils are equal, round, and reactive to light. No foreign bodies found.  Neck: Trachea normal and normal range of motion. Neck supple. Carotid bruit is not present. No mass and no thyromegaly present.  Cardiovascular: Normal rate, regular rhythm, S1 normal, S2 normal, normal heart sounds, intact distal pulses and normal pulses.  Exam reveals no gallop and no friction rub.   No murmur heard. Pulmonary/Chest: Effort normal and breath sounds normal. Not tachypneic. No respiratory distress. She has no decreased breath sounds. She has no wheezes. She has no rhonchi. She has no rales.  Abdominal: Soft. Normal appearance and bowel sounds are normal. There is tenderness in the epigastric area.       No tenderness over McBurney's point.  Neurological: She is alert. She has normal strength and normal reflexes. No cranial nerve deficit or sensory deficit.  Skin: Skin is warm, dry and intact. No rash noted.  Psychiatric: Her speech is normal and behavior is normal. Judgment and thought content normal. Her mood appears not anxious. Cognition and memory are normal. She does not exhibit a depressed mood.          Assessment & Plan:

## 2010-12-03 NOTE — Assessment & Plan Note (Addendum)
Will eval for liver issues, pancreas issue.. Although gastritis, esophagitits or PUD more likely. Will check stool for Hpylori. Given no improvement with two separate trials of omeprazole and nexium... If tests neg will refer to GI for likely ENDO. Does not seem typical of gallbladder disease.

## 2010-12-03 NOTE — Assessment & Plan Note (Signed)
IMproved see notes in migraine headache.

## 2010-12-08 ENCOUNTER — Other Ambulatory Visit: Payer: Self-pay | Admitting: Family Medicine

## 2010-12-08 DIAGNOSIS — R1013 Epigastric pain: Secondary | ICD-10-CM

## 2010-12-09 LAB — HELICOBACTER PYLORI  SPECIAL ANTIGEN: H. PYLORI Antigen: NEGATIVE

## 2010-12-10 ENCOUNTER — Telehealth: Payer: Self-pay | Admitting: *Deleted

## 2010-12-10 ENCOUNTER — Encounter: Payer: Self-pay | Admitting: Gastroenterology

## 2010-12-10 NOTE — Progress Notes (Signed)
Addended byKerby Nora E on: 12/10/2010 01:01 PM   Modules accepted: Orders

## 2010-12-10 NOTE — Telephone Encounter (Signed)
Please clarify issue... Sounds like prescriptions were call in?  If not please do so.

## 2010-12-10 NOTE — Telephone Encounter (Signed)
Spoke with pharmacy and the omeprazole is ready for pick up and the imitrex requires prior authorization and they will fax over now. Patient advised via message on machine

## 2010-12-10 NOTE — Telephone Encounter (Signed)
Patient called stating that Rx's for Imitrex and Omeprazole were suppose to be sent to [facility].  Rx for Omeprazole were sent electronically on 12/08/2010 and Imitrex was sent on 11/14/2010.  Please advise.

## 2011-01-04 ENCOUNTER — Encounter: Payer: Self-pay | Admitting: Gastroenterology

## 2011-01-04 ENCOUNTER — Ambulatory Visit (INDEPENDENT_AMBULATORY_CARE_PROVIDER_SITE_OTHER): Payer: BC Managed Care – PPO | Admitting: Gastroenterology

## 2011-01-04 ENCOUNTER — Other Ambulatory Visit (INDEPENDENT_AMBULATORY_CARE_PROVIDER_SITE_OTHER): Payer: BC Managed Care – PPO

## 2011-01-04 VITALS — BP 136/82 | HR 76 | Ht 67.0 in | Wt 223.4 lb

## 2011-01-04 DIAGNOSIS — R1310 Dysphagia, unspecified: Secondary | ICD-10-CM

## 2011-01-04 DIAGNOSIS — R1013 Epigastric pain: Secondary | ICD-10-CM

## 2011-01-04 DIAGNOSIS — K3189 Other diseases of stomach and duodenum: Secondary | ICD-10-CM

## 2011-01-04 LAB — CBC WITH DIFFERENTIAL/PLATELET
Basophils Relative: 0.6 % (ref 0.0–3.0)
Eosinophils Absolute: 0.1 10*3/uL (ref 0.0–0.7)
Eosinophils Relative: 1.4 % (ref 0.0–5.0)
Hemoglobin: 14.2 g/dL (ref 12.0–15.0)
Lymphocytes Relative: 38.1 % (ref 12.0–46.0)
MCHC: 33.6 g/dL (ref 30.0–36.0)
MCV: 95.4 fl (ref 78.0–100.0)
Monocytes Absolute: 0.3 10*3/uL (ref 0.1–1.0)
Neutro Abs: 3.3 10*3/uL (ref 1.4–7.7)
RBC: 4.42 Mil/uL (ref 3.87–5.11)

## 2011-01-04 NOTE — Patient Instructions (Signed)
You will be set up for an upper endoscopy. Samples of PPI given, take one pill once daily in place of the omeprazole.  This is best taken 20-30 minutes Prior to a meal. A copy of this information will be made available to Dr. Ermalene Searing.

## 2011-01-04 NOTE — Progress Notes (Signed)
HPI: This is a   very pleasant 40 year old woman  Epigastric pains, tightness, gurgling, bloating.  Has been going on intermittently for at least 2 years.  Had GI cocktail in ER 2-3 years ago with good relief of symptoms.  She has been on omeprazole one pill daily.  Was taking it at bedtime, but switched to AM dosing before BF (sometimes does not eat breakfast).  At first the PPI helped.    She feels bloated, gassy. She belches.  Eating may/may not help reliably.  NO nausea or vomiting.  She does have intermittent dysphagia to solids.  Overall gained wieght, 20 pounds in 2 years.  Was taking ibuprofen 3-4 pills per day for headaches. None in 3 months.   No overt GI bleeding.  Complete metabolic profile and lipase level last month were all normal      Review of systems: Pertinent positive and negative review of systems were noted in the above HPI section.  All other review of systems was otherwise negative.   Past Medical History  Diagnosis Date  . Allergy   . Chronic headaches   . GERD (gastroesophageal reflux disease)   . Hyperlipidemia     Past Surgical History  Procedure Date  . Cesarean section      reports that she has never smoked. She has never used smokeless tobacco. She reports that she does not drink alcohol or use illicit drugs.  family history includes Arthritis in her father; Breast cancer in her maternal grandmother; Depression in her mother; Diabetes in her paternal grandmother; Mental illness in her mother and sister; and Thyroid disease in her father.    Current Medications, Allergies were all reviewed with the patient via [facility] electronic medical record system.    Physical Exam: BP 136/82  Pulse 76  Ht 5\' 7"  (1.702 m)  Wt 223 lb 6.4 oz (101.334 kg)  BMI 34.99 kg/m2 Constitutional: generally well-appearing Psychiatric: alert and oriented x3 Eyes: extraocular movements intact Mouth: oral pharynx moist, no lesions Neck: supple no  lymphadenopathy Cardiovascular: heart regular rate and rhythm Lungs: clear to auscultation bilaterally Abdomen: soft, nontender, nondistended, no obvious ascites, no peritoneal signs, normal bowel sounds Extremities: no lower extremity edema bilaterally Skin: no lesions on visible extremities    Assessment and plan: 40 y.o. female with chronic dyspepsia, GERD-like  She has also had intermittent solid food dysphagia. I suspect most of her symptoms are acid related. Her obesity can contribute. She was on NSAIDs previously which could contribute but she has not really taken any in about 3 months. She is going to change her proton pump inhibitor 2 samples that we will give her. She'll take this 20-30 minutes prior to a meal. We will also proceed with EGD at her soonest convenience. She needs a CBC.

## 2011-01-18 ENCOUNTER — Other Ambulatory Visit: Payer: BC Managed Care – PPO | Admitting: Gastroenterology

## 2011-01-25 ENCOUNTER — Ambulatory Visit (INDEPENDENT_AMBULATORY_CARE_PROVIDER_SITE_OTHER): Payer: BC Managed Care – PPO | Admitting: Family Medicine

## 2011-01-25 ENCOUNTER — Encounter: Payer: Self-pay | Admitting: Family Medicine

## 2011-01-25 ENCOUNTER — Telehealth: Payer: Self-pay | Admitting: *Deleted

## 2011-01-25 VITALS — BP 120/60 | HR 82 | Temp 98.3°F | Ht 67.0 in | Wt 221.8 lb

## 2011-01-25 DIAGNOSIS — H10029 Other mucopurulent conjunctivitis, unspecified eye: Secondary | ICD-10-CM

## 2011-01-25 MED ORDER — POLYMYXIN B-TRIMETHOPRIM 10000-0.1 UNIT/ML-% OP SOLN: 1.0000 [drp] | OPHTHALMIC | Status: DC

## 2011-01-25 NOTE — Telephone Encounter (Signed)
Pt just called stating that she has what she know is pink eye.  Asks that an antibiotic be called in.  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 Offered appt tomorrow morning but she says she has an 8:00 class.  I told her the only other option would be urgent care, she said that's probably what she will have to do.

## 2011-01-25 NOTE — Telephone Encounter (Signed)
Pt called back, said she is not going to her conference and asks to be seen.  Appt scheduled with Dr. Patsy Lager for 4:15.  Advised her that if she is late he will not be able to see her.

## 2011-01-25 NOTE — Progress Notes (Signed)
  Subjective:    Patient ID: Jane Li, female    DOB: [birth year], 40 y.o.   MRN: [id number]  HPI  R > L irritated conjunctiva -- about 2 weeks or so, improved, but now gotten worse in the last 2 days. Rare material only, no trauma or known injury. No change in sight.  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  GEN: WDWN, NAD, Non-toxic, A & O x 3 HEENT: Atraumatic, Normocephalic. Neck supple. No masses, No LAD. Eyes: Full ROM, no surrounding redness. PERRLA. EOMI. Conjunctiva injected, R lateral most, R > L Ears and Nose: No external deformity. EXTR: No c/c/e NEURO Normal gait.  PSYCH: Normally interactive. Conversant. Not depressed or anxious appearing.  Calm demeanor.        Assessment & Plan:   1. Pink eye  trimethoprim-polymyxin b (POLYTRIM) ophthalmic solution    I think most likely pink eye -- if worsens in the next few days, opth eval.  Will treat as such

## 2011-01-31 ENCOUNTER — Ambulatory Visit: Payer: Self-pay | Admitting: Family Medicine

## 2011-01-31 ENCOUNTER — Telehealth: Payer: Self-pay | Admitting: *Deleted

## 2011-01-31 ENCOUNTER — Ambulatory Visit (INDEPENDENT_AMBULATORY_CARE_PROVIDER_SITE_OTHER): Payer: BC Managed Care – PPO | Admitting: Family Medicine

## 2011-01-31 ENCOUNTER — Encounter: Payer: Self-pay | Admitting: Family Medicine

## 2011-01-31 VITALS — BP 120/70 | HR 107 | Temp 98.9°F | Ht 67.0 in | Wt 220.4 lb

## 2011-01-31 DIAGNOSIS — M79604 Pain in right leg: Secondary | ICD-10-CM

## 2011-01-31 DIAGNOSIS — M79609 Pain in unspecified limb: Secondary | ICD-10-CM

## 2011-01-31 NOTE — Telephone Encounter (Signed)
Patient right leg doppler was negative for dvt patient advised okay to go home

## 2011-01-31 NOTE — Patient Instructions (Addendum)
Forward Walking: Go light 2 mins, easy about 2-3 mph Sideways Left: 2 mins, 0.6 - 0.8 mph Sideways Right: 2 mins, 0.6 - 0.8 mph Backwards, 2 mins, 1.8 - 2.2 mph Repeat, several cycles Goal is 30 minute  Straight leg raises: 1.Toes up  2. lying on side. 3. Foot turned out  3 sets of 30  REFERRAL: GO THE THE FRONT ROOM AT THE ENTRANCE OF OUR CLINIC, NEAR CHECK IN. ASK FOR MARION. SHE WILL HELP YOU SET UP YOUR REFERRAL. DATE: TIME:

## 2011-01-31 NOTE — Progress Notes (Signed)
  Subjective:    Patient ID: Jane Li, female    DOB: Mar 02, 1971, 40 y.o.   MRN: [id number]  HPI  Jane Li, a 40 y.o. female presents today in the office for the following:    Right leg and posterior calf pain.  Right leg is hurting --- for about ten days, has felt a little tight and felt a little bit pike it is pulling. Today, it is is feeling hot and faving some radiating heat. In increments of time, knee will feel like it is prickly like going to sleep. Sometimes will ge tthe circulation is off and on. Also down and radiating.   Some down on the bottom of foot.   The PMH, PSH, Social History, Family History, Medications, and allergies have been reviewed in [facility], and have been updated if relevant.   Review of Systems REVIEW OF SYSTEMS  GEN: No fevers, chills. Nontoxic. Primarily MSK c/o today. MSK: Detailed in the HPI GI: tolerating PO intake without difficulty Neuro: No numbness, parasthesias, or tingling associated. Otherwise the pertinent positives of the ROS are noted above.      Objective:   Physical Exam   Physical Exam  Blood pressure 120/70, pulse 107, temperature 98.9 F (37.2 C), temperature source Oral, height 5\' 7"  (1.702 m), weight 220 lb 6.4 oz (99.973 kg), SpO2 98.00%.  GEN: WDWN, NAD, Non-toxic, A & O x 3 HEENT: Atraumatic, Normocephalic. Neck supple. No masses, No LAD. Ears and Nose: No external deformity. EXTR: No c/c/e NEURO Normal gait.  PSYCH: Normally interactive. Conversant. Not depressed or anxious appearing.  Calm demeanor.   Right leg: Pain in the popliteal fossa. Full range of motion at the knee without any mechanical symptoms. Negative McMurray's. Stable to varus and the stress without effusion. There is pain with compression of the calf. Strength testing is 5/5 and produces no pain. Negative straight leg raise. Good hip range of motion which is normal. Nontender at the trochanteric bursa.      Assessment & Plan:   1. Leg pain,  right  US Venous Img Lower Unilateral Left, Lower Extremity Venous Reflux Right    Diet for potential DVT. At this time the ultrasound is back, and the patient does not have any evidence of venous thrombosis.  >25 minutes spent in face to face time with patient, >50% spent in counselling or coordination of care  I reviewed a rehabilitation program with the patient for calf and lower extremity strengthening.

## 2011-01-31 NOTE — Telephone Encounter (Signed)
noted 

## 2011-02-01 ENCOUNTER — Encounter: Payer: Self-pay | Admitting: Family Medicine

## 2011-03-17 ENCOUNTER — Encounter: Payer: Self-pay | Admitting: Family Medicine

## 2011-03-17 ENCOUNTER — Ambulatory Visit (INDEPENDENT_AMBULATORY_CARE_PROVIDER_SITE_OTHER): Payer: BC Managed Care – PPO | Admitting: Family Medicine

## 2011-03-17 VITALS — BP 124/82 | HR 84 | Temp 98.5°F | Ht 67.0 in | Wt 215.5 lb

## 2011-03-17 DIAGNOSIS — K219 Gastro-esophageal reflux disease without esophagitis: Secondary | ICD-10-CM

## 2011-03-17 DIAGNOSIS — R0789 Other chest pain: Secondary | ICD-10-CM | POA: Insufficient documentation

## 2011-03-17 DIAGNOSIS — J069 Acute upper respiratory infection, unspecified: Secondary | ICD-10-CM

## 2011-03-17 MED ORDER — LANSOPRAZOLE 30 MG PO CPDR: 30.0000 mg | DELAYED_RELEASE_CAPSULE | Freq: Every day | ORAL | Status: DC

## 2011-03-17 NOTE — Patient Instructions (Signed)
Continue acidic food, reflux trigger avoidance. Change prilosec to prevacid (lansoprazole).. Call if not working in next few weeks. Keep appt for GI endo eval as scheduled.   For viral infection: use mucinex, nasal saline irrigation... Call if not turning corner in 5-7 days.

## 2011-03-17 NOTE — Progress Notes (Signed)
  Subjective:    Patient ID: Jane Li, female    DOB: 09/25/1970, 40 y.o.   MRN: [id number]  HPI  40 year old female with history of GERD presents with poorly controlled heartburn.. Very painful in  Few weeks.  Better this week in last 4-5 days. HAs noted improvement with stress. Had noted food coming back into mouth, chest pain, consytant (burning, dullness, occ sharp).. Worse with bending over.  No exertional component, but she is worried about cardiac source.  Raw throat, occ cough. Occ dizziness and tingling... She has been somewhat anxious and stress.    Has been keeping food diary.. Not many changes with diet. Taking omeprazole daily 40 mg.  nexium was very expensive and did not help much more in past.  Has planned ENDO in 04/2011 with GI MD.      Review of Systems  Constitutional: Negative for fever and fatigue.  HENT: Negative for ear pain.   Eyes: Negative for pain.  Respiratory: Negative for chest tightness and shortness of breath.   Cardiovascular: Positive for chest pain. Negative for palpitations and leg swelling.  Gastrointestinal: Negative for abdominal pain.  Genitourinary: Negative for dysuria.       Objective:   Physical Exam  Constitutional: Vital signs are normal. She appears well-developed and well-nourished. She is cooperative.  Non-toxic appearance. She does not appear ill. No distress.  HENT:  Head: Normocephalic.  Right Ear: Hearing, tympanic membrane, external ear and ear canal normal. Tympanic membrane is not erythematous, not retracted and not bulging.  Left Ear: Hearing, tympanic membrane, external ear and ear canal normal. Tympanic membrane is not erythematous, not retracted and not bulging.  Nose: No mucosal edema or rhinorrhea. Right sinus exhibits no maxillary sinus tenderness and no frontal sinus tenderness. Left sinus exhibits no maxillary sinus tenderness and no frontal sinus tenderness.  Mouth/Throat: Uvula is midline, oropharynx is  clear and moist and mucous membranes are normal.  Eyes: Conjunctivae, EOM and lids are normal. Pupils are equal, round, and reactive to light. No foreign bodies found.  Neck: Trachea normal and normal range of motion. Neck supple. Carotid bruit is not present. No mass and no thyromegaly present.  Cardiovascular: Normal rate, regular rhythm, S1 normal, S2 normal, normal heart sounds, intact distal pulses and normal pulses.  Exam reveals no gallop and no friction rub.   No murmur heard. Pulmonary/Chest: Effort normal and breath sounds normal. Not tachypneic. No respiratory distress. She has no decreased breath sounds. She has no wheezes. She has no rhonchi. She has no rales.  Abdominal: Soft. Normal appearance and bowel sounds are normal. There is no tenderness.  Neurological: She is alert.  Skin: Skin is warm, dry and intact. No rash noted.  Psychiatric: Her speech is normal and behavior is normal. Judgment and thought content normal. Her mood appears not anxious. Cognition and memory are normal. She does not exhibit a depressed mood.          Assessment & Plan:

## 2011-03-17 NOTE — Assessment & Plan Note (Addendum)
EKG shows: NSR, occ PACs. Symptoms most consistent with GERD.. change to prevacid. Call if not improving as expected.

## 2011-04-18 ENCOUNTER — Ambulatory Visit (AMBULATORY_SURGERY_CENTER): Payer: BC Managed Care – PPO | Admitting: Gastroenterology

## 2011-04-18 ENCOUNTER — Encounter: Payer: Self-pay | Admitting: Gastroenterology

## 2011-04-18 DIAGNOSIS — R1310 Dysphagia, unspecified: Secondary | ICD-10-CM

## 2011-04-18 DIAGNOSIS — K3189 Other diseases of stomach and duodenum: Secondary | ICD-10-CM

## 2011-04-18 DIAGNOSIS — R1013 Epigastric pain: Secondary | ICD-10-CM

## 2011-04-18 MED ORDER — SODIUM CHLORIDE 0.9 % IV SOLN: 500.0000 mL | INTRAVENOUS | Status: DC

## 2011-04-18 NOTE — Progress Notes (Signed)
Patient did not have preoperative order for IV antibiotic SSI prophylaxis. (G8918)  Patient did not experience any of the following events: a burn prior to discharge; a fall within the facility; wrong site/side/patient/procedure/implant event; or a hospital transfer or hospital admission upon discharge from the facility. (G8907)  

## 2011-04-18 NOTE — Op Note (Signed)
[facility] [address] [place], [place]  [address]  ENDOSCOPY PROCEDURE REPORT  PATIENT:  Jane Li, Jane Li  MR#:  [id number] BIRTHDATE:  September 24, 1970, 40 yrs. old  GENDER:  female ENDOSCOPIST:  Rachael Fee, MD Referred by:  Excell Seltzer, M.D. PROCEDURE DATE:  04/18/2011 PROCEDURE:  EGD, diagnostic 43235 ASA CLASS:  Class II INDICATIONS:  dyspepsia MEDICATIONS:   Fentanyl 50 mcg IV, These medications were titrated to patient response per physician's verbal order, Versed 8 mg IV TOPICAL ANESTHETIC:  Cetacaine Spray  DESCRIPTION OF PROCEDURE:   After the risks benefits and alternatives of the procedure were thoroughly explained, informed consent was obtained.  The LB GIF-H180 [id number] endoscope was introduced through the mouth and advanced to the second portion of the duodenum, without limitations.  The instrument was slowly withdrawn as the mucosa was fully examined. <<PROCEDUREIMAGES>> The upper, middle, and distal third of the esophagus were carefully inspected and no abnormalities were noted. The z-line was well seen at the GEJ. The endoscope was pushed into the fundus which was normal including a retroflexed view. The antrum,gastric body, first and second part of the duodenum were unremarkable (see image1, image2, image3, image4, and image6).    Retroflexed views revealed no abnormalities.    The scope was then withdrawn from the patient and the procedure completed. COMPLICATIONS:  None  ENDOSCOPIC IMPRESSION: 1) Normal EGD  RECOMMENDATIONS: Please start 1-2 gas-ex pills with every meal. Call Dr. Christella Hartigan' office to report on your symptoms in 3 weeks.  ______________________________ Rachael Fee, MD  n. eSIGNED:   Rachael Fee at 04/18/2011 10:48 AM  Maretta Los, [phone]

## 2011-04-18 NOTE — Patient Instructions (Signed)
Dr. Christella Hartigan would like for you to take anti gas medicine- 2 with every meal.  Please call the office in 3 weeks with an update.   You may resume your routine medications today.  If you have any questions, please call [phone].  Thank-you.

## 2011-04-19 ENCOUNTER — Telehealth: Payer: Self-pay | Admitting: *Deleted

## 2011-04-19 NOTE — Telephone Encounter (Signed)
No answer, message left

## 2011-11-11 ENCOUNTER — Encounter: Payer: Self-pay | Admitting: Family Medicine

## 2011-11-11 ENCOUNTER — Ambulatory Visit (INDEPENDENT_AMBULATORY_CARE_PROVIDER_SITE_OTHER): Payer: BC Managed Care – PPO | Admitting: Family Medicine

## 2011-11-11 VITALS — BP 124/82 | HR 68 | Temp 97.8°F | Wt 214.0 lb

## 2011-11-11 DIAGNOSIS — R0981 Nasal congestion: Secondary | ICD-10-CM | POA: Insufficient documentation

## 2011-11-11 DIAGNOSIS — J3489 Other specified disorders of nose and nasal sinuses: Secondary | ICD-10-CM

## 2011-11-11 MED ORDER — AMOXICILLIN-POT CLAVULANATE 875-125 MG PO TABS: 1.0000 | ORAL_TABLET | Freq: Two times a day (BID) | ORAL | Status: AC

## 2011-11-11 NOTE — Progress Notes (Signed)
  Subjective:    Patient ID: Jane Li, female    DOB: 1970-10-27, 41 y.o.   MRN: [id number]  HPI CC: sinus congestion  sxs ongoing for last several months.  sinus congestion.  H/o seasonal allergies, but now even in the summer having trouble with clearing sinuses, worse in am when awakens.  Head feels clogged, pressure headahces.  Feels PNDrainage throughout day.  ST as well.  Today things got worse - bad HA, sinus congestion, feeling some dizzy described as lightheaded sensation.  + facial pressure.  Ear itching as well that is really bothersome.  Frontal and maxillary facial pain.  Not really RN, itchy eyes, sneezing  Taking nasonex but not daily, pseudophed, but nothing resolving sxs.  Not currently using claritin.  Doesn't use nasal saline regularly.  No fevers/chills, abd pain, n/v, diarrhea, ear pain, chest pain, SOB.  No cough.  Overall clear sputum when blowing nose.  No sick contacts at home.  No smokers at home.  No h/o asthma.  On topamax for migraines.  Past Medical History  Diagnosis Date  . Allergy   . Chronic headaches   . GERD (gastroesophageal reflux disease)   . Hyperlipidemia      Review of Systems Per HPI    Objective:   Physical Exam  Nursing note and vitals reviewed. Constitutional: She appears well-developed and well-nourished. No distress.  HENT:  Head: Normocephalic and atraumatic.  Right Ear: Hearing, tympanic membrane, external ear and ear canal normal.  Left Ear: Hearing, tympanic membrane, external ear and ear canal normal.  Nose: Mucosal edema present. No rhinorrhea. Right sinus exhibits maxillary sinus tenderness and frontal sinus tenderness. Left sinus exhibits maxillary sinus tenderness and frontal sinus tenderness.  Mouth/Throat: Uvula is midline, oropharynx is clear and moist and mucous membranes are normal. No oropharyngeal exudate, posterior oropharyngeal edema, posterior oropharyngeal erythema or tonsillar abscesses.       Some dry  cerumen deep near ear canal R>L Boggy turbinates  Eyes: Conjunctivae and EOM are normal. Pupils are equal, round, and reactive to light. No scleral icterus.  Neck: Normal range of motion. Neck supple.  Cardiovascular: Normal rate, regular rhythm, normal heart sounds and intact distal pulses.   No murmur heard. Pulmonary/Chest: Effort normal and breath sounds normal. No respiratory distress. She has no wheezes. She has no rales.  Lymphadenopathy:    She has no cervical adenopathy.  Skin: Skin is warm and dry. No rash noted.       Assessment & Plan:

## 2011-11-11 NOTE — Assessment & Plan Note (Signed)
Will treat any infectious component with 10 d course of augmentin given duration of sxs. Anticipate more due to perennial allergic rhinitis. rec daily INS, nasal saline irrigation ,and antihistamine (to change brand). If not improving, consider leukotriene receptor antagonist.

## 2011-11-11 NOTE — Patient Instructions (Addendum)
Possible sinus infection - take augmentin for 10 days. I wonder how much this is allergies becoming yearlong. start daily nasonex and antihistamine, daily nasal saline irrigation. If not improving, let us know, may need to see Dr. Ermalene Searing again. For ears- try dilute hydrogen peroxide as discussed.

## 2011-12-11 ENCOUNTER — Other Ambulatory Visit: Payer: Self-pay | Admitting: Family Medicine

## 2012-04-05 ENCOUNTER — Other Ambulatory Visit: Payer: Self-pay | Admitting: Family Medicine

## 2012-04-13 ENCOUNTER — Encounter: Payer: Self-pay | Admitting: Family Medicine

## 2012-04-13 ENCOUNTER — Ambulatory Visit (INDEPENDENT_AMBULATORY_CARE_PROVIDER_SITE_OTHER): Payer: BC Managed Care – PPO | Admitting: Family Medicine

## 2012-04-13 ENCOUNTER — Telehealth: Payer: Self-pay | Admitting: Family Medicine

## 2012-04-13 VITALS — BP 140/92 | HR 88 | Temp 98.2°F | Ht 67.0 in | Wt 217.0 lb

## 2012-04-13 DIAGNOSIS — I10 Essential (primary) hypertension: Secondary | ICD-10-CM | POA: Insufficient documentation

## 2012-04-13 DIAGNOSIS — R0789 Other chest pain: Secondary | ICD-10-CM

## 2012-04-13 DIAGNOSIS — R0981 Nasal congestion: Secondary | ICD-10-CM

## 2012-04-13 DIAGNOSIS — R030 Elevated blood-pressure reading, without diagnosis of hypertension: Secondary | ICD-10-CM

## 2012-04-13 DIAGNOSIS — R079 Chest pain, unspecified: Secondary | ICD-10-CM

## 2012-04-13 DIAGNOSIS — J3489 Other specified disorders of nose and nasal sinuses: Secondary | ICD-10-CM

## 2012-04-13 NOTE — Telephone Encounter (Signed)
Dr Ermalene Searing said to tell pt to come to office now and she will see pt. Pt advised.

## 2012-04-13 NOTE — Telephone Encounter (Signed)
We have no appt available what do i tell her

## 2012-04-13 NOTE — Assessment & Plan Note (Signed)
Lilkly due to pseudoephedrine.  Stop med and follow BP.  Return in 1 week for recheck.. If remains elevated will need to begin HTN eval. Call sooner if BP very elevated to start medication.  Overdue for CPX.

## 2012-04-13 NOTE — Telephone Encounter (Signed)
Patient Information:  Caller Name: Annali  Phone: [phone]  Patient: Jane Li  Gender: Female  DOB: June 12, 1970  Age: 42 Years  PCP: Kerby Nora (Family Practice)  Pregnant: No  Office Follow Up:  Does the office need to follow up with this patient?: Yes  Instructions For The Office: See Within 4 Hours; info to office for staff management of appt need krs/can  RN Note:  Per protocol, advised appt within 4 hours; no appts available in Epic.  Info to office for staff management of appt need.  May reach patient at [phone].  krs/can  Symptoms  Reason For Call & Symptoms: blood pressure check during family games, and noted BP was 196/115.  Rechecked 20 minutes later; 156/101.  States AM 04/13/12 she went to [facility] and noted it was 144/99.  Reviewed Health History In EMR: Yes  Reviewed Medications In EMR: Yes  Reviewed Allergies In EMR: Yes  Reviewed Surgeries / Procedures: Yes  Date of Onset of Symptoms: 04/12/2012 OB / GYN:  LMP: 03/30/2012  Guideline(s) Used:  High Blood Pressure  Disposition Per Guideline:   See Today in Office  Reason For Disposition Reached:   BP > 180/110  Advice Given:  N/A

## 2012-04-13 NOTE — Assessment & Plan Note (Signed)
Likely due to gas/GERD history. No sign of cardiac source. Has had heart eval in last year. If CP continues consider returning to see Dr. Demetrius Charity.

## 2012-04-13 NOTE — Assessment & Plan Note (Signed)
INcrease nasonex to 2 sprays per nostril.

## 2012-04-13 NOTE — Progress Notes (Signed)
Subjective:    Patient ID: Jane Li, female    DOB: [birth year], 42 y.o.   MRN: [id number]  HPI  42 year old female  presents with recent elevated BPs.   She has been having BPs elevated in 144-99 to 190/100s in last 24 hours. Took BP just for fun, felt okay at the time. She has been taking pseudoephedrine for nasal congestion 4-5 days a week, but has not taken it in a while.   Also having some chest pain, bubble in chest from Reflux. Has ben having this for a while. Burping up acid a lot. Not taking any reflux med. Has recently seen GI upper endoscopy that was normal. Gas X has helped.  No SOB. No new neuro changes but has had occ episodes of not remembering things. No exercise. No headache at the time of very elevated BP ODes have  sinus headache from congestion and migraine.. Did start back on topamax in last month for migraine headaches.  No past history of HTN.  Some recent stress.  Planning ablation procedure for menorrhagia.  Wt Readings from Last 3 Encounters:  04/13/12 217 lb (98.431 kg)  11/11/11 214 lb (97.07 kg)  04/18/11 215 lb (97.523 kg)   Had nuclear stress test: was low risk except one place not seen, Dr. Welton Flakes recommended possible cath. Second opinions Dr.  Darrold Junker said not to do cath. Work on lifestyle changes Which she is doing.   Review of Systems  Constitutional: Negative for fever and fatigue.  HENT: Negative for ear pain.   Eyes: Negative for pain.  Respiratory: Negative for chest tightness and shortness of breath.   Cardiovascular: Positive for chest pain. Negative for palpitations and leg swelling.  Gastrointestinal: Negative for abdominal pain.  Genitourinary: Negative for dysuria.  Psychiatric/Behavioral: Negative for dysphoric mood.       Objective:   Physical Exam  Constitutional: Vital signs are normal. She appears well-developed and well-nourished. She is cooperative.  Non-toxic appearance. She does not appear ill. No distress.   obese  HENT:  Head: Normocephalic.  Right Ear: Hearing, tympanic membrane, external ear and ear canal normal. Tympanic membrane is not erythematous, not retracted and not bulging.  Left Ear: Hearing, tympanic membrane, external ear and ear canal normal. Tympanic membrane is not erythematous, not retracted and not bulging.  Nose: Mucosal edema present. No rhinorrhea. Right sinus exhibits no maxillary sinus tenderness and no frontal sinus tenderness. Left sinus exhibits no maxillary sinus tenderness and no frontal sinus tenderness.  Mouth/Throat: Uvula is midline, oropharynx is clear and moist and mucous membranes are normal.  Eyes: Conjunctivae normal, EOM and lids are normal. Pupils are equal, round, and reactive to light. No foreign bodies found.  Neck: Trachea normal and normal range of motion. Neck supple. Carotid bruit is not present. No mass and no thyromegaly present.  Cardiovascular: Normal rate, regular rhythm, S1 normal, S2 normal, normal heart sounds, intact distal pulses and normal pulses.  Exam reveals no gallop and no friction rub.   No murmur heard. Pulmonary/Chest: Effort normal and breath sounds normal. Not tachypneic. No respiratory distress. She has no decreased breath sounds. She has no wheezes. She has no rhonchi. She has no rales.  Abdominal: Soft. Normal appearance and bowel sounds are normal. There is no tenderness.  Neurological: She is alert.  Skin: Skin is warm, dry and intact. No rash noted.  Psychiatric: Her speech is normal and behavior is normal. Judgment and thought content normal. Her mood appears not anxious.  Cognition and memory are normal. She does not exhibit a depressed mood.          Assessment & Plan:

## 2012-04-13 NOTE — Patient Instructions (Addendum)
Stop pseudoephedrine. Follow BP at home, call if they remain very high 160/100 or more. Follow up in 1-2 week.  Increase nasonex to 2 sprays per nostril daily. Nasal saline 2-3 times a  Day. Also schedule CPX  In 1-2 months with fasting labs prior.

## 2012-04-13 NOTE — Telephone Encounter (Signed)
Pt calls back; pt has migraine so has dull h/a, dizziness on and off for one week; not dizzy now. Constipation but no N or V. No chest pain or SOB but feels full in chest like a bubble is there; pt taking Gas X with some relief. Pt has to return to work on Mon and wants to be seen today.Please advise.

## 2012-05-29 ENCOUNTER — Telehealth: Payer: Self-pay | Admitting: Family Medicine

## 2012-05-29 DIAGNOSIS — Z13220 Encounter for screening for lipoid disorders: Secondary | ICD-10-CM

## 2012-05-29 NOTE — Telephone Encounter (Signed)
Message copied by Excell Seltzer on Tue May 29, 2012  1:58 PM ------      Message from: Alvina Chou      Created: Tue May 22, 2012  4:41 PM      Regarding: Lab orders for Wednesday, 2.19.14       Patient is scheduled for CPX labs, please order future labs, Thanks , Jane Li       ------

## 2012-05-31 ENCOUNTER — Other Ambulatory Visit (INDEPENDENT_AMBULATORY_CARE_PROVIDER_SITE_OTHER): Payer: BC Managed Care – PPO

## 2012-05-31 DIAGNOSIS — Z13220 Encounter for screening for lipoid disorders: Secondary | ICD-10-CM

## 2012-05-31 LAB — COMPREHENSIVE METABOLIC PANEL
ALT: 29 U/L (ref 0–35)
AST: 54 U/L — ABNORMAL HIGH (ref 0–37)
Albumin: 3.7 g/dL (ref 3.5–5.2)
Calcium: 9.2 mg/dL (ref 8.4–10.5)
Chloride: 109 mEq/L (ref 96–112)
Potassium: 4.3 mEq/L (ref 3.5–5.1)

## 2012-05-31 LAB — LIPID PANEL
HDL: 54.8 mg/dL (ref >39.00)
Total CHOL/HDL Ratio: 4

## 2012-06-07 ENCOUNTER — Ambulatory Visit (INDEPENDENT_AMBULATORY_CARE_PROVIDER_SITE_OTHER): Payer: BC Managed Care – PPO | Admitting: Family Medicine

## 2012-06-07 ENCOUNTER — Encounter: Payer: Self-pay | Admitting: Family Medicine

## 2012-06-07 VITALS — BP 120/86 | HR 72 | Temp 98.8°F | Ht 67.0 in | Wt 221.5 lb

## 2012-06-07 DIAGNOSIS — R7989 Other specified abnormal findings of blood chemistry: Secondary | ICD-10-CM

## 2012-06-07 DIAGNOSIS — R0789 Other chest pain: Secondary | ICD-10-CM

## 2012-06-07 DIAGNOSIS — R030 Elevated blood-pressure reading, without diagnosis of hypertension: Secondary | ICD-10-CM

## 2012-06-07 DIAGNOSIS — R109 Unspecified abdominal pain: Secondary | ICD-10-CM

## 2012-06-07 MED ORDER — MOMETASONE FUROATE 50 MCG/ACT NA SUSP: 2.0000 | Freq: Every day | NASAL | Status: DC

## 2012-06-07 NOTE — Patient Instructions (Addendum)
Avoid all alcohol, OTC supplements and tylenol. Return for a liver check in 1-2 weeks. Stop at front desk to set up [place].

## 2012-06-07 NOTE — Assessment & Plan Note (Signed)
No ETOH, no OTC meds regularly.? Fatty liver.  Eval with repeat labs, hepatitis panel and US liver.

## 2012-06-07 NOTE — Assessment & Plan Note (Signed)
Improved off decongestatnt, but still few days high... Follow over time. Encouraged exercise, weight loss, healthy eating habits.

## 2012-06-07 NOTE — Progress Notes (Signed)
Subjective:    Patient ID: Jane Li, female    DOB: 1971-01-12, 42 y.o.   MRN: [id number]  HPI  42 year old female presents for chronic health maintenance. She see GYN for annual physical.  She continues to have chest heaviness, bubble in chest from Reflux. Has been having this for a while.  Worse after eating. Burping up acid a lot. Not taking any reflux med. Has recently seen GI upper endoscopy that was normal. Gas X has helped. Tried prilosec and nexium but this did not help. Has had cardiology eval. Low risk nuclear stress test 1 year ago... Slight abnormality... Recommended catheterization but thought it wwas most likely a shadow. She got a second opinion from another cardiologist and he flet her symptoms may e weight related. She feels that this issue improved with exercise an weight loss in last year.    Elevated BP without dx of HTN: at goal today.  Improved after she came off psuedophedrine.  Using medication without problems or lightheadedness:  None Chest pain with exertion:None Edema:None Short of breath:None Average home BPs: Nml  117/78 except a couple of days elevation early in 05/2012 Other issues:   Labs reviewed in detail. Lab Results  Component Value Date   CHOL 199 05/31/2012   HDL 54.80 05/31/2012   LDLCALC 127* 05/31/2012   LDLDIRECT 172.2 05/16/2007   TRIG 87.0 05/31/2012   CHOLHDL 4 05/31/2012   Allergic rhinitis: symptoms recurring... nasonex helps. Needs refill.  Elevated transaminase: Limited ETOH use, once a week. No transfusion history. No meds that could cause. Rare tylenol use.  Review of Systems  Constitutional: Negative for fever, fatigue and unexpected weight change.  HENT: Negative for ear pain, congestion, sore throat, sneezing, trouble swallowing and sinus pressure.   Eyes: Negative for pain and itching.  Respiratory: Negative for cough, shortness of breath and wheezing.   Cardiovascular: Positive for chest pain. Negative for  palpitations and leg swelling.  Gastrointestinal: Positive for nausea and abdominal pain. Negative for diarrhea, constipation and blood in stool.  Genitourinary: Negative for dysuria, hematuria, vaginal discharge, difficulty urinating and menstrual problem.  Skin: Negative for rash.  Neurological: Negative for syncope, weakness, light-headedness, numbness and headaches.  Psychiatric/Behavioral: Negative for confusion and dysphoric mood. The patient is not nervous/anxious.        Objective:   Physical Exam  Constitutional: Vital signs are normal. She appears well-developed and well-nourished. She is cooperative.  Non-toxic appearance. She does not appear ill. No distress.  HENT:  Head: Normocephalic.  Right Ear: Hearing, tympanic membrane, external ear and ear canal normal.  Left Ear: Hearing, tympanic membrane, external ear and ear canal normal.  Nose: Nose normal.  Eyes: Conjunctivae, EOM and lids are normal. Pupils are equal, round, and reactive to light. No foreign bodies found.  Neck: Trachea normal and normal range of motion. Neck supple. Carotid bruit is not present. No mass and no thyromegaly present.  Cardiovascular: Normal rate, regular rhythm, S1 normal, S2 normal, normal heart sounds and intact distal pulses.  Exam reveals no gallop.   No murmur heard. Pulmonary/Chest: Effort normal and breath sounds normal. No respiratory distress. She has no wheezes. She has no rhonchi. She has no rales.  Abdominal: Soft. Normal appearance and bowel sounds are normal. She exhibits no distension, no fluid wave, no abdominal bruit and no mass. There is no hepatosplenomegaly. There is tenderness in the right upper quadrant and epigastric area. There is no rebound, no guarding and no CVA tenderness.  No hernia.    Lymphadenopathy:    She has no cervical adenopathy.    She has no axillary adenopathy.  Neurological: She is alert. She has normal strength. No cranial nerve deficit or sensory deficit.    Skin: Skin is warm, dry and intact. No rash noted.  Psychiatric: Her speech is normal and behavior is normal. Judgment normal. Her mood appears not anxious. Cognition and memory are normal. She does not exhibit a depressed mood.          Assessment & Plan:  The patient's preventative maintenance and recommended screening tests for an annual wellness exam were reviewed in full today. Brought up to date unless services declined.  Counselled on the importance of diet, exercise, and its role in overall health and mortality. The patient's FH and SH was reviewed, including their home life, tobacco status, and drug and alcohol status.   Vaccine: uptodate with Td, did not get flu vaccine in 2013 PAP/Mammo: at GYN, has been normla. No early family history of colon cancer or breast cancer. Nonsmoker

## 2012-06-07 NOTE — Assessment & Plan Note (Signed)
Neg endoscopy, neg cards work up.  related to meals.. ? Gallbladder issue.  Will send for [place] of RUQ.

## 2012-06-08 ENCOUNTER — Other Ambulatory Visit (INDEPENDENT_AMBULATORY_CARE_PROVIDER_SITE_OTHER): Payer: BC Managed Care – PPO

## 2012-06-08 LAB — HEPATIC FUNCTION PANEL
AST: 17 U/L (ref 0–37)
Alkaline Phosphatase: 49 U/L (ref 39–117)
Total Bilirubin: 0.4 mg/dL (ref 0.3–1.2)

## 2012-06-11 LAB — HEPATITIS PANEL, ACUTE
HCV Ab: NEGATIVE
Hepatitis B Surface Ag: NEGATIVE

## 2012-06-20 ENCOUNTER — Telehealth: Payer: Self-pay

## 2012-06-20 NOTE — Telephone Encounter (Signed)
Pt scheduled for lab test 06/21/12 at [facility] [place] to repeat liver test. Pt said was repeated on 06/08/12 and was told liver test was normal. Pt not sure if still needs to have lab work done 06/21/12. Pt will wait to hear if needs blood test done before coming to lab  on 06/21/12.Please advise.

## 2012-06-21 ENCOUNTER — Telehealth: Payer: Self-pay | Admitting: Family Medicine

## 2012-06-21 ENCOUNTER — Other Ambulatory Visit: Payer: BC Managed Care – PPO

## 2012-06-21 NOTE — Telephone Encounter (Signed)
No further labs need to be done. Cancel today's lab appt. Let pt know.

## 2012-06-21 NOTE — Telephone Encounter (Signed)
Patient advised.

## 2012-06-21 NOTE — Telephone Encounter (Signed)
Caller: Violeta/Patient; Phone: [phone]; Reason for Call: Patient reports she called yesterday but has not received a reply at this time concerning her lab work.  Pt states she was scheduled to have lab work done today but had physical and lab work done 06/08/12 and all was normal.  Pt would like top know if she DOES need to come today or reschedule for later date.  PLEASE F/U WITH PT ASAP TO ADVISE.  THANK YOU.

## 2012-06-22 ENCOUNTER — Ambulatory Visit
Admission: RE | Admit: 2012-06-22 | Discharge: 2012-06-22 | Disposition: A | Discharge status: Home / Self Care | Payer: BC Managed Care – PPO | Source: Ambulatory Visit | Attending: Family Medicine | Admitting: Family Medicine

## 2012-06-22 DIAGNOSIS — R109 Unspecified abdominal pain: Secondary | ICD-10-CM

## 2012-06-22 DIAGNOSIS — R7989 Other specified abnormal findings of blood chemistry: Secondary | ICD-10-CM

## 2012-10-01 ENCOUNTER — Emergency Department: Payer: Self-pay | Admitting: Emergency Medicine

## 2013-01-18 ENCOUNTER — Ambulatory Visit (INDEPENDENT_AMBULATORY_CARE_PROVIDER_SITE_OTHER): Payer: BC Managed Care – PPO | Admitting: Family Medicine

## 2013-01-18 ENCOUNTER — Encounter: Payer: Self-pay | Admitting: Family Medicine

## 2013-01-18 ENCOUNTER — Telehealth: Payer: Self-pay | Admitting: Family Medicine

## 2013-01-18 VITALS — BP 146/90 | HR 78 | Temp 98.1°F | Ht 67.0 in | Wt 235.5 lb

## 2013-01-18 DIAGNOSIS — I10 Essential (primary) hypertension: Secondary | ICD-10-CM

## 2013-01-18 LAB — POCT URINALYSIS DIPSTICK
Bilirubin, UA: NEGATIVE
Blood, UA: NEGATIVE
Glucose, UA: NEGATIVE
Leukocytes, UA: NEGATIVE
Nitrite, UA: NEGATIVE
Protein, UA: NEGATIVE
Urobilinogen, UA: 0.2

## 2013-01-18 MED ORDER — LISINOPRIL-HYDROCHLOROTHIAZIDE 20-12.5 MG PO TABS: 1.0000 | ORAL_TABLET | Freq: Every day | ORAL | Status: DC

## 2013-01-18 NOTE — Telephone Encounter (Signed)
Patient Information:  Caller Name: Kaelin  Phone: [phone]  Patient: Jane Li  Gender: Female  DOB: 04-26-1970  Age: 42 Years  PCP: Kerby Nora (Family Practice)  Pregnant: No  Office Follow Up:  Does the office need to follow up with this patient?: No  Instructions For The Office: N/A  RN Note:  Pt has had elevated BP for last 3 days, ranging from 136/92 to 156/112, w/ dull Headache.  Pt is not on any meds. Pt denies vision changes, nose bleeds, chest pain or SOB. Pt has appt at 1400 on 10-10 prior to call.  Advised Pt to monitor sxs and call back or call 911 if Chest pain, SOB, vision changes or headache worsen.  Pt verbalized understanding.  Symptoms  Reason For Call & Symptoms: Elevated BP 144/112.  Pt states BP has been elevated for last 3 days.  Reviewed Health History In EMR: Yes  Reviewed Medications In EMR: Yes  Reviewed Allergies In EMR: Yes  Reviewed Surgeries / Procedures: Yes  Date of Onset of Symptoms: 01/15/2013 OB / GYN:  LMP: Unknown  Guideline(s) Used:  High Blood Pressure  Disposition Per Guideline:   See Today in Office  Reason For Disposition Reached:   BP > 180/110  Advice Given:  N/A  Patient Will Follow Care Advice:  YES

## 2013-01-18 NOTE — Patient Instructions (Signed)
Discuss use of OCP with gynecologist at next OV.   Return for fasting labs early next week.  If kidney functio  Normal start blood pressure medicine.  Follow BP at home... Bring to next app.  Follow up in 2 weeks BP check.

## 2013-01-18 NOTE — Telephone Encounter (Signed)
Agree 

## 2013-01-18 NOTE — Assessment & Plan Note (Addendum)
New diagnosis. Likely secondary to fam hx, weight gain and OCPs.  Per pt she cannot stop OCPs, will discuss with GYN. Encouraged exercise, weight loss, healthy eating habits.  Eval for secondary cause, end organ damage and risk factors. EKG today: neg UA:no protein Send for labs. Start on lisinopril HCTZ.

## 2013-01-18 NOTE — Progress Notes (Signed)
  Subjective:    Patient ID: Jane Li, female    DOB: March 25, 1971, 42 y.o.   MRN: [id number]  HPI  42 year old female presents with elevated BP measurements at home in past few days.  130-156/92-112. She has fatigue , mild dizziness, feels flushed. No chest pain but mild ache in lungs. No SOB.  Last 2 nights she has been feeling flushed and palpitations.  She has no diagnosis of HTN.  She has had some elevated BPs in past on sudafed.   She has started essential oils. Has used an oil to try to decreased appetitie. Wt Readings from Last 3 Encounters:  01/18/13 235 lb 8 oz (106.822 kg)  06/07/12 221 lb 8 oz (100.472 kg)  04/13/12 217 lb (98.431 kg)  No ibuprofen use. She has been exercising. Trying to eat healthy, decreasing portion size.  Family hx of HTN in mother.  Cholesterol at goal in 05/2012  Lab Results  Component Value Date   CHOL 199 05/31/2012   HDL 54.80 05/31/2012   LDLCALC 127* 05/31/2012   LDLDIRECT 172.2 05/16/2007   TRIG 87.0 05/31/2012   CHOLHDL 4 05/31/2012    Unable to come off OCPs given menses issues and migraines. Started back on OCP in 06/2012   Review of Systems  Constitutional: Positive for fatigue. Negative for fever.  HENT: Negative for ear pain.   Eyes: Negative for pain.  Cardiovascular: Positive for palpitations and leg swelling. Negative for chest pain.  Gastrointestinal: Negative for abdominal pain.       Objective:   Physical Exam  Constitutional: Vital signs are normal. She appears well-developed and well-nourished. She is cooperative.  Non-toxic appearance. She does not appear ill. No distress.  HENT:  Head: Normocephalic.  Right Ear: Hearing, tympanic membrane, external ear and ear canal normal. Tympanic membrane is not erythematous, not retracted and not bulging.  Left Ear: Hearing, tympanic membrane, external ear and ear canal normal. Tympanic membrane is not erythematous, not retracted and not bulging.  Nose: No mucosal edema or  rhinorrhea. Right sinus exhibits no maxillary sinus tenderness and no frontal sinus tenderness. Left sinus exhibits no maxillary sinus tenderness and no frontal sinus tenderness.  Mouth/Throat: Uvula is midline, oropharynx is clear and moist and mucous membranes are normal.  Eyes: Conjunctivae, EOM and lids are normal. Pupils are equal, round, and reactive to light. Lids are everted and swept, no foreign bodies found.  Neck: Trachea normal and normal range of motion. Neck supple. Carotid bruit is not present. No mass and no thyromegaly present.  Cardiovascular: Normal rate, regular rhythm, S1 normal, S2 normal, normal heart sounds, intact distal pulses and normal pulses.  Exam reveals no gallop and no friction rub.   No murmur heard. 1 plus pitting edema B  Pulmonary/Chest: Effort normal and breath sounds normal. Not tachypneic. No respiratory distress. She has no decreased breath sounds. She has no wheezes. She has no rhonchi. She has no rales.  Abdominal: Soft. Normal appearance and bowel sounds are normal. There is no tenderness.  Neurological: She is alert.  Skin: Skin is warm, dry and intact. No rash noted.  Psychiatric: Her speech is normal and behavior is normal. Judgment and thought content normal. Her mood appears not anxious. Cognition and memory are normal. She does not exhibit a depressed mood.   No papilledema  Or retinal vessel changes on eye exam       Assessment & Plan:

## 2013-01-23 ENCOUNTER — Other Ambulatory Visit: Payer: BC Managed Care – PPO

## 2013-01-24 ENCOUNTER — Other Ambulatory Visit (INDEPENDENT_AMBULATORY_CARE_PROVIDER_SITE_OTHER): Payer: BC Managed Care – PPO

## 2013-01-24 DIAGNOSIS — R5381 Other malaise: Secondary | ICD-10-CM

## 2013-01-24 DIAGNOSIS — E663 Overweight: Secondary | ICD-10-CM

## 2013-01-24 DIAGNOSIS — R5383 Other fatigue: Secondary | ICD-10-CM

## 2013-01-24 DIAGNOSIS — R7989 Other specified abnormal findings of blood chemistry: Secondary | ICD-10-CM

## 2013-01-24 DIAGNOSIS — I10 Essential (primary) hypertension: Secondary | ICD-10-CM

## 2013-01-24 LAB — COMPREHENSIVE METABOLIC PANEL
ALT: 19 U/L (ref 0–35)
AST: 21 U/L (ref 0–37)
Albumin: 3.7 g/dL (ref 3.5–5.2)
Calcium: 9.2 mg/dL (ref 8.4–10.5)
Chloride: 101 mEq/L (ref 96–112)
Potassium: 4.3 mEq/L (ref 3.5–5.1)
Sodium: 137 mEq/L (ref 135–145)
Total Bilirubin: 0.4 mg/dL (ref 0.3–1.2)
Total Protein: 7.4 g/dL (ref 6.0–8.3)

## 2013-01-24 LAB — CBC WITH DIFFERENTIAL/PLATELET
Eosinophils Relative: 1.7 % (ref 0.0–5.0)
HCT: 42.6 % (ref 36.0–46.0)
Hemoglobin: 14.4 g/dL (ref 12.0–15.0)
Lymphocytes Relative: 39.1 % (ref 12.0–46.0)
Lymphs Abs: 2.5 10*3/uL (ref 0.7–4.0)
Monocytes Relative: 6.3 % (ref 3.0–12.0)
Neutro Abs: 3.3 10*3/uL (ref 1.4–7.7)
Platelets: 256 10*3/uL (ref 150.0–400.0)
WBC: 6.4 10*3/uL (ref 4.5–10.5)

## 2013-01-24 LAB — LIPID PANEL: Total CHOL/HDL Ratio: 4

## 2013-01-24 LAB — LDL CHOLESTEROL, DIRECT: Direct LDL: 148.8 mg/dL

## 2013-02-01 ENCOUNTER — Encounter: Payer: Self-pay | Admitting: Family Medicine

## 2013-02-01 ENCOUNTER — Ambulatory Visit (INDEPENDENT_AMBULATORY_CARE_PROVIDER_SITE_OTHER): Payer: BC Managed Care – PPO | Admitting: Family Medicine

## 2013-02-01 VITALS — BP 116/82 | HR 67 | Temp 98.1°F | Ht 67.0 in | Wt 229.5 lb

## 2013-02-01 DIAGNOSIS — R0789 Other chest pain: Secondary | ICD-10-CM

## 2013-02-01 DIAGNOSIS — E7800 Pure hypercholesterolemia, unspecified: Secondary | ICD-10-CM | POA: Insufficient documentation

## 2013-02-01 DIAGNOSIS — I10 Essential (primary) hypertension: Secondary | ICD-10-CM

## 2013-02-01 LAB — BASIC METABOLIC PANEL
BUN: 11 mg/dL (ref 6–23)
Calcium: 9.5 mg/dL (ref 8.4–10.5)
Creat: 0.85 mg/dL (ref 0.50–1.10)
Glucose, Bld: 78 mg/dL (ref 70–99)
Sodium: 137 mEq/L (ref 135–145)

## 2013-02-01 NOTE — Progress Notes (Signed)
  Subjective:    Patient ID: Jane Li, female    DOB: January 18, 1971, 42 y.o.   MRN: [id number]  HPI Hypertension: Improved control on new lisinopril HCTZ. No SE to medicaiton  NO sign of secondary cause or end organ damage with recent work up. Using medication without problems or lightheadedness: none Chest pain with exertion:None Edema:None Short of breath:None Average home BPs:120/70s Other issues:  New dx of high cholesterol, no exercise, poor diet, fast food. Lab Results  Component Value Date   CHOL 216* 01/24/2013   HDL 53.70 01/24/2013   LDLCALC 127* 05/31/2012   LDLDIRECT 148.8 01/24/2013   TRIG 142.0 01/24/2013   CHOLHDL 4 01/24/2013      Review of Systems  Constitutional: Negative for fever and fatigue.  HENT: Negative for ear pain.   Eyes: Negative for pain.  Respiratory: Negative for chest tightness and shortness of breath.   Cardiovascular: Negative for chest pain, palpitations and leg swelling.  Gastrointestinal: Negative for abdominal pain.  Genitourinary: Negative for dysuria.       Objective:   Physical Exam  Constitutional: Vital signs are normal. She appears well-developed and well-nourished. She is cooperative.  Non-toxic appearance. She does not appear ill. No distress.  overweight  HENT:  Head: Normocephalic.  Right Ear: Hearing, tympanic membrane, external ear and ear canal normal. Tympanic membrane is not erythematous, not retracted and not bulging.  Left Ear: Hearing, tympanic membrane, external ear and ear canal normal. Tympanic membrane is not erythematous, not retracted and not bulging.  Nose: No mucosal edema or rhinorrhea. Right sinus exhibits no maxillary sinus tenderness and no frontal sinus tenderness. Left sinus exhibits no maxillary sinus tenderness and no frontal sinus tenderness.  Mouth/Throat: Uvula is midline, oropharynx is clear and moist and mucous membranes are normal.  Eyes: Conjunctivae, EOM and lids are normal. Pupils are  equal, round, and reactive to light. Lids are everted and swept, no foreign bodies found.  Neck: Trachea normal and normal range of motion. Neck supple. Carotid bruit is not present. No mass and no thyromegaly present.  Cardiovascular: Normal rate, regular rhythm, S1 normal, S2 normal, normal heart sounds, intact distal pulses and normal pulses.  Exam reveals no gallop and no friction rub.   No murmur heard. Pulmonary/Chest: Effort normal and breath sounds normal. Not tachypneic. No respiratory distress. She has no decreased breath sounds. She has no wheezes. She has no rhonchi. She has no rales.  Abdominal: Soft. Normal appearance and bowel sounds are normal. There is no tenderness.  Neurological: She is alert.  Skin: Skin is warm, dry and intact. No rash noted.  Psychiatric: Her speech is normal and behavior is normal. Judgment and thought content normal. Her mood appears not anxious. Cognition and memory are normal. She does not exhibit a depressed mood.          Assessment & Plan:

## 2013-02-01 NOTE — Patient Instructions (Addendum)
Work on weight loss, low fat, low cholesterol diet. Start red yeast rice 600 mg 2 tabs twice daily.  Follow up in 3-4  Months for HTN, chol check with fasting labs prior.  Stop at lab on way out.

## 2013-02-04 ENCOUNTER — Encounter: Payer: Self-pay | Admitting: *Deleted

## 2013-02-04 NOTE — Assessment & Plan Note (Addendum)
Encouraged exercise, weight loss, healthy eating habits. Info given.  Start on red yeast rice.  Recheck in 3-4 months

## 2013-02-04 NOTE — Assessment & Plan Note (Signed)
Improved control on new med. Due for creatinine check after 7-10 days of ACEI. No SE. Continue work on lifestyle change.

## 2013-05-03 ENCOUNTER — Other Ambulatory Visit: Payer: BC Managed Care – PPO

## 2013-05-06 ENCOUNTER — Telehealth: Payer: Self-pay | Admitting: Family Medicine

## 2013-05-06 ENCOUNTER — Other Ambulatory Visit (INDEPENDENT_AMBULATORY_CARE_PROVIDER_SITE_OTHER): Payer: BC Managed Care – PPO

## 2013-05-06 DIAGNOSIS — E7800 Pure hypercholesterolemia, unspecified: Secondary | ICD-10-CM

## 2013-05-06 LAB — COMPREHENSIVE METABOLIC PANEL
ALBUMIN: 3.7 g/dL (ref 3.5–5.2)
ALK PHOS: 50 U/L (ref 39–117)
ALT: 20 U/L (ref 0–35)
AST: 16 U/L (ref 0–37)
BUN: 17 mg/dL (ref 6–23)
CO2: 26 mEq/L (ref 19–32)
Calcium: 9.3 mg/dL (ref 8.4–10.5)
Chloride: 107 mEq/L (ref 96–112)
Creatinine, Ser: 0.9 mg/dL (ref 0.4–1.2)
GFR: 76.88 mL/min (ref >60.00)
GLUCOSE: 86 mg/dL (ref 70–99)
POTASSIUM: 4.4 meq/L (ref 3.5–5.1)
SODIUM: 140 meq/L (ref 135–145)
TOTAL PROTEIN: 7.2 g/dL (ref 6.0–8.3)
Total Bilirubin: 0.5 mg/dL (ref 0.3–1.2)

## 2013-05-06 LAB — LIPID PANEL
CHOL/HDL RATIO: 4
Cholesterol: 212 mg/dL — ABNORMAL HIGH (ref 0–200)
HDL: 50.9 mg/dL (ref >39.00)
Triglycerides: 128 mg/dL (ref 0.0–149.0)
VLDL: 25.6 mg/dL (ref 0.0–40.0)

## 2013-05-06 LAB — LDL CHOLESTEROL, DIRECT: Direct LDL: 146.6 mg/dL

## 2013-05-06 NOTE — Telephone Encounter (Signed)
Message copied by Excell SeltzerBEDSOLE, Slater Mcmanaman E on Mon May 06, 2013  8:07 AM ------      Message from: Alvina ChouWALSH, TERRI J      Created: Mon Apr 29, 2013 12:27 PM      Regarding: Lab orders for Monday,1.26.15       Lab orders for a 3 month f/u ------

## 2013-05-10 ENCOUNTER — Encounter: Payer: Self-pay | Admitting: Family Medicine

## 2013-05-10 ENCOUNTER — Ambulatory Visit (INDEPENDENT_AMBULATORY_CARE_PROVIDER_SITE_OTHER): Payer: BC Managed Care – PPO | Admitting: Family Medicine

## 2013-05-10 VITALS — BP 112/84 | HR 70 | Temp 98.2°F | Ht 67.0 in | Wt 233.0 lb

## 2013-05-10 DIAGNOSIS — I10 Essential (primary) hypertension: Secondary | ICD-10-CM

## 2013-05-10 DIAGNOSIS — E7800 Pure hypercholesterolemia, unspecified: Secondary | ICD-10-CM

## 2013-05-10 NOTE — Assessment & Plan Note (Signed)
Well controlled. Continue current medication.  

## 2013-05-10 NOTE — Progress Notes (Signed)
HPI  3 month follow up  Hypertension: Improved control on new lisinopril HCTZ. No SE to medicaiton  NO sign of secondary cause or end organ damage with recent work up.  Using medication without problems or lightheadedness: none  Chest pain with exertion:None  Edema:None  Short of breath:None  Average home BPs:120/70s  Other issues:   BP Readings from Last 3 Encounters:  05/10/13 112/84  02/01/13 116/82  01/18/13 146/90    New dx of high cholesterol, still  no exercise, minimal improvement in diet. Has not started red yeast rice.  Lab Results  Component Value Date   CHOL 212* 05/06/2013   HDL 50.90 05/06/2013   LDLCALC 127* 05/31/2012   LDLDIRECT 146.6 05/06/2013   TRIG 128.0 05/06/2013   CHOLHDL 4 05/06/2013    Review of Systems  Constitutional: Negative for fever and fatigue.  HENT: Negative for ear pain.  Eyes: Negative for pain.  Respiratory: Negative for chest tightness and shortness of breath.  Cardiovascular: Negative for chest pain, palpitations and leg swelling.  Gastrointestinal: Negative for abdominal pain.  Genitourinary: Negative for dysuria.  Objective:   Physical Exam  Constitutional: Vital signs are normal. She appears well-developed and well-nourished. She is cooperative. Non-toxic appearance. She does not appear ill. No distress.  overweight  HENT:  Head: Normocephalic.  Right Ear: Hearing, tympanic membrane, external ear and ear canal normal. Tympanic membrane is not erythematous, not retracted and not bulging.  Left Ear: Hearing, tympanic membrane, external ear and ear canal normal. Tympanic membrane is not erythematous, not retracted and not bulging.  Nose: No mucosal edema or rhinorrhea. Right sinus exhibits no maxillary sinus tenderness and no frontal sinus tenderness. Left sinus exhibits no maxillary sinus tenderness and no frontal sinus tenderness.  Mouth/Throat: Uvula is midline, oropharynx is clear and moist and mucous membranes are normal.  Eyes:  Conjunctivae, EOM and lids are normal. Pupils are equal, round, and reactive to light. Lids are everted and swept, no foreign bodies found.  Neck: Trachea normal and normal range of motion. Neck supple. Carotid bruit is not present. No mass and no thyromegaly present.  Cardiovascular: Normal rate, regular rhythm, S1 normal, S2 normal, normal heart sounds, intact distal pulses and normal pulses. Exam reveals no gallop and no friction rub.  No murmur heard.  Pulmonary/Chest: Effort normal and breath sounds normal. Not tachypneic. No respiratory distress. She has no decreased breath sounds. She has no wheezes. She has no rhonchi. She has no rales.  Abdominal: Soft. Normal appearance and bowel sounds are normal. There is no tenderness.  Neurological: She is alert.  Skin: Skin is warm, dry and intact. No rash noted.  Psychiatric: Her speech is normal and behavior is normal. Judgment and thought content normal. Her mood appears not anxious. Cognition and memory are normal. She does not exhibit a depressed mood.

## 2013-05-10 NOTE — Progress Notes (Signed)
Pre-visit discussion using our clinic review tool. No additional management support is needed unless otherwise documented below in the visit note.  

## 2013-05-10 NOTE — Patient Instructions (Addendum)
Start regular exercise. Work on weight loss and low cholesterol low animal fat diet. Consider red yeast rice 600 mg two capsules twice daily. Return in 6 months for fasting labs to recheck lipids only. Follow up with Dr. B in 1 year.  Hypercholesterolemia High Blood Cholesterol Cholesterol is a white, waxy, fat-like protein needed by your body in small amounts. The liver makes all the cholesterol you need. It is carried from the liver by the blood through the blood vessels. Deposits (plaque) may build up on blood vessel walls. This makes the arteries narrower and stiffer. Plaque increases the risk for heart attack and stroke. You cannot feel your cholesterol level even if it is very high. The only way to know is by a blood test to check your lipid (fats) levels. Once you know your cholesterol levels, you should keep a record of the test results. Work with your caregiver to to keep your levels in the desired range. WHAT THE RESULTS MEAN:  Total cholesterol is a rough measure of all the cholesterol in your blood.  LDL is the so-called bad cholesterol. This is the type that deposits cholesterol in the walls of the arteries. You want this level to be low.  HDL is the good cholesterol because it cleans the arteries and carries the LDL away. You want this level to be high.  Triglycerides are fat that the body can either burn for energy or store. High levels are closely linked to heart disease. DESIRED LEVELS:  Total cholesterol below 200.  LDL below 100 for people at risk, below 70 for very high risk.  HDL above 50 is good, above 60 is best.  Triglycerides below 150. HOW TO LOWER YOUR CHOLESTEROL:  Diet.  Choose fish or white meat chicken and [place], roasted or baked. Limit fatty cuts of red meat, fried foods, and processed meats, such as sausage and lunch meat.  Eat lots of fresh fruits and vegetables. Choose whole grains, beans, pasta, potatoes and cereals.  Use only small amounts of  olive, corn or canola oils. Avoid butter, mayonnaise, shortening or palm kernel oils. Avoid foods with trans-fats.  Use skim/nonfat milk and low-fat/nonfat yogurt and cheeses. Avoid whole milk, cream, ice cream, egg yolks and cheeses. Healthy desserts include angel food cake, gingersnaps, animal crackers, hard candy, popsicles, and low-fat/nonfat frozen yogurt. Avoid pastries, cakes, pies and cookies.  Exercise.  A regular program helps decrease LDL and raises HDL.  Helps with weight control.  Do things that increase your activity level like gardening, walking, or taking the stairs.  Medication.  May be prescribed by your caregiver to help lowering cholesterol and the risk for heart disease.  You may need medicine even if your levels are normal if you have several risk factors. HOME CARE INSTRUCTIONS   Follow your diet and exercise programs as suggested by your caregiver.  Take medications as directed.  Have blood work done when your caregiver feels it is necessary. MAKE SURE YOU:   Understand these instructions.  Will watch your condition.  Will get help right away if you are not doing well or get worse. Document Released: 03/28/2005 Document Revised: 06/20/2011 Document Reviewed: 09/13/2006 [facility] Patient Information 2014 [employer] [place].

## 2013-05-10 NOTE — Assessment & Plan Note (Signed)
Poor control. Encouraged exercise, weight loss, healthy eating habits. Never started red yeast rice.. Will consider.

## 2013-08-21 ENCOUNTER — Other Ambulatory Visit: Payer: Self-pay | Admitting: Family Medicine

## 2013-10-01 ENCOUNTER — Encounter: Payer: Self-pay | Admitting: Family Medicine

## 2013-10-01 ENCOUNTER — Ambulatory Visit (INDEPENDENT_AMBULATORY_CARE_PROVIDER_SITE_OTHER): Payer: BC Managed Care – PPO | Admitting: Family Medicine

## 2013-10-01 VITALS — BP 122/90 | HR 85 | Temp 98.5°F | Ht 67.0 in | Wt 235.8 lb

## 2013-10-01 DIAGNOSIS — M79605 Pain in left leg: Secondary | ICD-10-CM

## 2013-10-01 DIAGNOSIS — R079 Chest pain, unspecified: Secondary | ICD-10-CM

## 2013-10-01 DIAGNOSIS — M79609 Pain in unspecified limb: Secondary | ICD-10-CM

## 2013-10-01 DIAGNOSIS — K219 Gastro-esophageal reflux disease without esophagitis: Secondary | ICD-10-CM

## 2013-10-01 MED ORDER — PANTOPRAZOLE SODIUM 40 MG PO TBEC: 40.0000 mg | DELAYED_RELEASE_TABLET | Freq: Every day | ORAL | Status: DC

## 2013-10-01 MED ORDER — SUCRALFATE 1 GM/10ML PO SUSP: 1.0000 g | Freq: Three times a day (TID) | ORAL | Status: DC

## 2013-10-01 NOTE — Assessment & Plan Note (Signed)
EKG stable. Does not sound cardiac or respiratory. Pt agreeable.  Will treat GERd as likely cause as below.

## 2013-10-01 NOTE — Patient Instructions (Addendum)
   EKG stable, no concerns. Avoid ibuprofen, aleve. Start pantpoprazole daily and use carafate as needed four times a day. Avoid caffeine, alcohol, tomatos, spicy, citris, soda. Follow up in 2 weeks.

## 2013-10-01 NOTE — Assessment & Plan Note (Signed)
MSK strain? No sign of DVT.  If persistent consider US to eval for Baker cyst.

## 2013-10-01 NOTE — Progress Notes (Signed)
Pre visit review using our clinic review tool, if applicable. No additional management support is needed unless otherwise documented below in the visit note. 

## 2013-10-01 NOTE — Assessment & Plan Note (Signed)
Trigger avoidance.  Start pantoprazole and carafate. Follow up in 2 weeks.

## 2013-10-01 NOTE — Progress Notes (Signed)
Subjective:    Patient ID: Jane Li, female    DOB: 05/14/1970, 43 y.o.   MRN: [id number]  Leg Pain   Chest Pain  Associated symptoms include leg pain. Pertinent negatives include no abdominal pain, fever, palpitations or shortness of breath.    43 year old female with history of HTN, high cholesterol presents with new onset  chest pain central, ache and shortness of breath in last month. Symptoms are constant throughout the day.    Chest heaviness worse at night. If she sits up at night in recliner she feels better. She feels nauseous after eating anything.  No issues during exercise, only at rest.  She has been exercising lately to lose weight.   She feels that it may be due to GERD, she has had similar issues in past.  She has been of omeprazole  in last 3 months.  She has started back  Omeprazole 20 mg for 5 days, no relief. Also took 6 days of nexium? 40 mg .. No relief.  She reports body ache at night. Legs achy. Sore and tender in left posterior leg. New bruise on right antero-lateral calf.  No new meds, no NSAIDs. She was treated few weeks ago with amox and mucinex D for sinus infection.  BP Readings from Last 3 Encounters:  10/01/13 122/90  05/10/13 112/84  02/01/13 116/82      Review of Systems  Constitutional: Negative for fever and fatigue.  HENT: Negative for ear pain.   Eyes: Negative for pain.  Respiratory: Negative for chest tightness and shortness of breath.   Cardiovascular: Positive for chest pain. Negative for palpitations and leg swelling.  Gastrointestinal: Negative for abdominal pain.  Genitourinary: Negative for dysuria.  Musculoskeletal: Positive for myalgias.       Objective:   Physical Exam  Constitutional: Vital signs are normal. She appears well-developed and well-nourished. She is cooperative.  Non-toxic appearance. She does not appear ill. No distress.  Overweight   HENT:  Head: Normocephalic.  Right Ear: Hearing, tympanic  membrane, external ear and ear canal normal. Tympanic membrane is not erythematous, not retracted and not bulging.  Left Ear: Hearing, tympanic membrane, external ear and ear canal normal. Tympanic membrane is not erythematous, not retracted and not bulging.  Nose: No mucosal edema or rhinorrhea. Right sinus exhibits no maxillary sinus tenderness and no frontal sinus tenderness. Left sinus exhibits no maxillary sinus tenderness and no frontal sinus tenderness.  Mouth/Throat: Uvula is midline, oropharynx is clear and moist and mucous membranes are normal.  Eyes: Conjunctivae, EOM and lids are normal. Pupils are equal, round, and reactive to light. Lids are everted and swept, no foreign bodies found.  Neck: Trachea normal and normal range of motion. Neck supple. Carotid bruit is not present. No mass and no thyromegaly present.  Cardiovascular: Normal rate, regular rhythm, S1 normal, S2 normal, normal heart sounds, intact distal pulses and normal pulses.  Exam reveals no gallop and no friction rub.   No murmur heard. Pulmonary/Chest: Effort normal and breath sounds normal. Not tachypneic. No respiratory distress. She has no decreased breath sounds. She has no wheezes. She has no rhonchi. She has no rales.  Abdominal: Soft. Normal appearance and bowel sounds are normal. There is no tenderness.  Musculoskeletal:       Left knee: Normal. She exhibits normal range of motion and no swelling. No tenderness found. No medial joint line, no lateral joint line, no MCL, no LCL and no patellar tendon tenderness  noted.  ttp posterior to left knee, no clear swelling Bruise on right anterior leg  No peripheral edema B      Neurological: She is alert.  Skin: Skin is warm, dry and intact. No rash noted.  Psychiatric: Her speech is normal and behavior is normal. Judgment and thought content normal. Her mood appears not anxious. Cognition and memory are normal. She does not exhibit a depressed mood.            Assessment & Plan:

## 2013-10-15 ENCOUNTER — Telehealth: Payer: Self-pay | Admitting: Family Medicine

## 2013-10-15 ENCOUNTER — Ambulatory Visit (INDEPENDENT_AMBULATORY_CARE_PROVIDER_SITE_OTHER): Payer: BC Managed Care – PPO | Admitting: Family Medicine

## 2013-10-15 ENCOUNTER — Encounter: Payer: Self-pay | Admitting: Family Medicine

## 2013-10-15 VITALS — BP 116/84 | HR 84 | Temp 98.3°F | Ht 67.0 in | Wt 236.0 lb

## 2013-10-15 DIAGNOSIS — R0609 Other forms of dyspnea: Secondary | ICD-10-CM

## 2013-10-15 DIAGNOSIS — R7989 Other specified abnormal findings of blood chemistry: Secondary | ICD-10-CM

## 2013-10-15 DIAGNOSIS — K219 Gastro-esophageal reflux disease without esophagitis: Secondary | ICD-10-CM | POA: Diagnosis not present

## 2013-10-15 DIAGNOSIS — E7800 Pure hypercholesterolemia, unspecified: Secondary | ICD-10-CM

## 2013-10-15 DIAGNOSIS — M79605 Pain in left leg: Secondary | ICD-10-CM

## 2013-10-15 DIAGNOSIS — I10 Essential (primary) hypertension: Secondary | ICD-10-CM | POA: Diagnosis not present

## 2013-10-15 DIAGNOSIS — R5383 Other fatigue: Secondary | ICD-10-CM

## 2013-10-15 DIAGNOSIS — R0602 Shortness of breath: Secondary | ICD-10-CM | POA: Insufficient documentation

## 2013-10-15 DIAGNOSIS — M79609 Pain in unspecified limb: Secondary | ICD-10-CM | POA: Diagnosis not present

## 2013-10-15 DIAGNOSIS — R5381 Other malaise: Secondary | ICD-10-CM

## 2013-10-15 DIAGNOSIS — R1013 Epigastric pain: Secondary | ICD-10-CM | POA: Insufficient documentation

## 2013-10-15 DIAGNOSIS — R945 Abnormal results of liver function studies: Secondary | ICD-10-CM

## 2013-10-15 DIAGNOSIS — R0989 Other specified symptoms and signs involving the circulatory and respiratory systems: Secondary | ICD-10-CM

## 2013-10-15 LAB — COMPREHENSIVE METABOLIC PANEL
ALT: 20 U/L (ref 0–35)
AST: 20 U/L (ref 0–37)
Albumin: 3.7 g/dL (ref 3.5–5.2)
Alkaline Phosphatase: 54 U/L (ref 39–117)
BUN: 10 mg/dL (ref 6–23)
CALCIUM: 9.5 mg/dL (ref 8.4–10.5)
CO2: 25 meq/L (ref 19–32)
CREATININE: 0.9 mg/dL (ref 0.4–1.2)
Chloride: 104 mEq/L (ref 96–112)
GFR: 74.7 mL/min (ref >60.00)
Glucose, Bld: 93 mg/dL (ref 70–99)
Potassium: 4.1 mEq/L (ref 3.5–5.1)
Sodium: 138 mEq/L (ref 135–145)
Total Bilirubin: 0.3 mg/dL (ref 0.2–1.2)
Total Protein: 7.4 g/dL (ref 6.0–8.3)

## 2013-10-15 LAB — CBC WITH DIFFERENTIAL/PLATELET
BASOS ABS: 0 10*3/uL (ref 0.0–0.1)
Basophils Relative: 0.6 % (ref 0.0–3.0)
EOS ABS: 0.2 10*3/uL (ref 0.0–0.7)
Eosinophils Relative: 2.5 % (ref 0.0–5.0)
HCT: 41.6 % (ref 36.0–46.0)
Hemoglobin: 14.1 g/dL (ref 12.0–15.0)
LYMPHS PCT: 34.3 % (ref 12.0–46.0)
Lymphs Abs: 2.8 10*3/uL (ref 0.7–4.0)
MCHC: 33.8 g/dL (ref 30.0–36.0)
MCV: 94.1 fl (ref 78.0–100.0)
Monocytes Absolute: 0.6 10*3/uL (ref 0.1–1.0)
Monocytes Relative: 7.1 % (ref 3.0–12.0)
NEUTROS PCT: 55.5 % (ref 43.0–77.0)
Neutro Abs: 4.5 10*3/uL (ref 1.4–7.7)
PLATELETS: 267 10*3/uL (ref 150.0–400.0)
RBC: 4.42 Mil/uL (ref 3.87–5.11)
RDW: 13.6 % (ref 11.5–15.5)
WBC: 8 10*3/uL (ref 4.0–10.5)

## 2013-10-15 LAB — LIPASE: Lipase: 31 U/L (ref 11.0–59.0)

## 2013-10-15 NOTE — Progress Notes (Signed)
Pre visit review using our clinic review tool, if applicable. No additional management support is needed unless otherwise documented below in the visit note. 

## 2013-10-15 NOTE — Addendum Note (Signed)
Addended by: Baldomero LamyHAVERS, NATASHA C on: 10/15/2013 04:37 PM   Modules accepted: Orders

## 2013-10-15 NOTE — Patient Instructions (Addendum)
Stop at lab on way out. We will call with resutls.  Continue pantoprazole and carafate. Gentle stretching exercises of left leg.

## 2013-10-15 NOTE — Assessment & Plan Note (Signed)
MSK strain.  Treat with elevationa nd gentle stretching.  No clear Baker cyst on exam and pain has moved.

## 2013-10-15 NOTE — Assessment & Plan Note (Signed)
Spirometry nml.  Most likely secondary to GI symptoms and deconditioning.  No clear cardiac symptoms, improves the longer she exercsies and iss associated after eating with GI symptoms.

## 2013-10-15 NOTE — Assessment & Plan Note (Signed)
Improved with pantoprazole and carafate.  Will eval with further labs including Hpylori given continuing. Consider referral back to GI if labs nml.

## 2013-10-15 NOTE — Progress Notes (Signed)
   Subjective:    Patient ID: Jane Li, female    DOB: 11/10/1970, 43 y.o.   MRN: [id number]  HPI 43 year old female presents for follow up atypical chest pain felt to be secondary to GERD.  Seen on 6/23 and was started on pantoprazole and carafate.  She reports she is 50% better, now with less chest pain and reflux.  She does not have constant pain but still gas bubbles and burping and chest pressure after eating.  Recent loose BMs, no blood.  She still notes shortness of breath with exertion. She has noted it worse since starting new med. Breaths normally but occ has to take deep breath to catch up. She has been exercising more regularly lately ( no chest pain associated) Can walk for 30-40 min.   US 06/2012 : no gallstones, just fatty liver.  2013 upper endo: nml per Dr. Larae GroomsJacob's  Pain in left leg, she still has some pain in leg but now has moved to lower calf and upper thigh.  Not as severe. No swelling in ankle.       Review of Systems  Constitutional: Negative for fever and fatigue.  HENT: Negative for ear pain.   Eyes: Negative for pain.  Respiratory: Negative for chest tightness and shortness of breath.   Cardiovascular: Negative for chest pain, palpitations and leg swelling.  Gastrointestinal: Negative for abdominal pain.  Genitourinary: Negative for dysuria.       Objective:   Physical Exam  Constitutional: Vital signs are normal. She appears well-developed and well-nourished. She is cooperative.  Non-toxic appearance. She does not appear ill. No distress.  HENT:  Head: Normocephalic.  Right Ear: Hearing, tympanic membrane, external ear and ear canal normal. Tympanic membrane is not erythematous, not retracted and not bulging.  Left Ear: Hearing, tympanic membrane, external ear and ear canal normal. Tympanic membrane is not erythematous, not retracted and not bulging.  Nose: No mucosal edema or rhinorrhea. Right sinus exhibits no maxillary sinus tenderness  and no frontal sinus tenderness. Left sinus exhibits no maxillary sinus tenderness and no frontal sinus tenderness.  Mouth/Throat: Uvula is midline, oropharynx is clear and moist and mucous membranes are normal.  Eyes: Conjunctivae, EOM and lids are normal. Pupils are equal, round, and reactive to light. Lids are everted and swept, no foreign bodies found.  Neck: Trachea normal and normal range of motion. Neck supple. Carotid bruit is not present. No mass and no thyromegaly present.  Cardiovascular: Normal rate, regular rhythm, S1 normal, S2 normal, normal heart sounds, intact distal pulses and normal pulses.  Exam reveals no gallop and no friction rub.   No murmur heard. Pulmonary/Chest: Effort normal and breath sounds normal. Not tachypneic. No respiratory distress. She has no decreased breath sounds. She has no wheezes. She has no rhonchi. She has no rales.  Abdominal: Soft. Normal appearance and bowel sounds are normal. There is no hepatosplenomegaly. There is tenderness in the right upper quadrant and epigastric area. There is no rebound, no guarding and no CVA tenderness.  Neurological: She is alert.  Skin: Skin is warm, dry and intact. No rash noted.  Psychiatric: Her speech is normal and behavior is normal. Judgment and thought content normal. Her mood appears not anxious. Cognition and memory are normal. She does not exhibit a depressed mood.          Assessment & Plan:

## 2013-10-15 NOTE — Telephone Encounter (Signed)
Relevant patient education assigned to patient using Emmi. ° °

## 2013-10-16 NOTE — Addendum Note (Signed)
Addended by: Liane ComberHAVERS, Gregorey Nabor C on: 10/16/2013 11:54 AM   Modules accepted: Orders

## 2013-10-17 LAB — HELICOBACTER PYLORI  SPECIAL ANTIGEN: H. PYLORI ANTIGEN STOOL: POSITIVE

## 2013-10-18 ENCOUNTER — Telehealth: Payer: Self-pay | Admitting: *Deleted

## 2013-10-18 DIAGNOSIS — M79605 Pain in left leg: Secondary | ICD-10-CM

## 2013-10-18 DIAGNOSIS — R0609 Other forms of dyspnea: Secondary | ICD-10-CM

## 2013-10-18 DIAGNOSIS — K219 Gastro-esophageal reflux disease without esophagitis: Secondary | ICD-10-CM

## 2013-10-18 MED ORDER — CLARITHROMYCIN 500 MG PO TABS: 500.0000 mg | ORAL_TABLET | Freq: Two times a day (BID) | ORAL | Status: DC

## 2013-10-18 MED ORDER — AMOXICILLIN 500 MG PO TABS: 1000.0000 mg | ORAL_TABLET | Freq: Two times a day (BID) | ORAL | Status: DC

## 2013-10-18 MED ORDER — PANTOPRAZOLE SODIUM 40 MG PO TBEC: 40.0000 mg | DELAYED_RELEASE_TABLET | Freq: Two times a day (BID) | ORAL | Status: DC

## 2013-10-18 NOTE — Telephone Encounter (Signed)
Message copied by Damita LackLORING, DONNA S on Fri Oct 18, 2013  3:06 PM ------      Message from: FeltonBEDSOLE, VirginiaMY E      Created: Fri Oct 18, 2013  2:10 PM       Notify pt that she was H pylori positive which is a bacteria that causes gastritis and ulcers ( liekly causing her symptoms). Recommend triple therapy. Increase pantoprazole to 40 mg twice daily and add 14 days of amox 1000 mg BID and  Clarithromycin 500 mg BID . Please send in rx's        Have her make appt for follow up if not already schedules in 3 weeks. ------

## 2013-10-18 NOTE — Telephone Encounter (Signed)
Jane Li notified her H Pylori test was positive.  Will treat with Amoxicillin, Clarithromycin & Pantoprazole.  Medication instructions given in detail. [facility] [address]  Follow up appointment scheduled for 11/12/2013 @ 8:45 am.

## 2013-11-06 ENCOUNTER — Telehealth: Payer: Self-pay | Admitting: *Deleted

## 2013-11-06 NOTE — Telephone Encounter (Signed)
Pt has finished taking the abx and now taking the protonix bid and wondering if she should continue taking it bid or qd? Please advise

## 2013-11-10 NOTE — Telephone Encounter (Signed)
Continue BID until appt on 8/4

## 2013-11-11 NOTE — Telephone Encounter (Signed)
Patient notified as instructed by telephone. 

## 2013-11-12 ENCOUNTER — Encounter: Payer: Self-pay | Admitting: Family Medicine

## 2013-11-12 ENCOUNTER — Ambulatory Visit (INDEPENDENT_AMBULATORY_CARE_PROVIDER_SITE_OTHER): Payer: BC Managed Care – PPO | Admitting: Family Medicine

## 2013-11-12 VITALS — BP 118/76 | HR 74 | Temp 98.3°F | Ht 67.0 in | Wt 236.5 lb

## 2013-11-12 DIAGNOSIS — K2970 Gastritis, unspecified, without bleeding: Secondary | ICD-10-CM

## 2013-11-12 DIAGNOSIS — M79609 Pain in unspecified limb: Secondary | ICD-10-CM

## 2013-11-12 DIAGNOSIS — B9681 Helicobacter pylori [H. pylori] as the cause of diseases classified elsewhere: Secondary | ICD-10-CM | POA: Insufficient documentation

## 2013-11-12 DIAGNOSIS — K2940 Chronic atrophic gastritis without bleeding: Secondary | ICD-10-CM

## 2013-11-12 DIAGNOSIS — B373 Candidiasis of vulva and vagina: Secondary | ICD-10-CM

## 2013-11-12 DIAGNOSIS — K219 Gastro-esophageal reflux disease without esophagitis: Secondary | ICD-10-CM

## 2013-11-12 DIAGNOSIS — R1013 Epigastric pain: Secondary | ICD-10-CM

## 2013-11-12 DIAGNOSIS — M79605 Pain in left leg: Secondary | ICD-10-CM

## 2013-11-12 DIAGNOSIS — A048 Other specified bacterial intestinal infections: Secondary | ICD-10-CM

## 2013-11-12 DIAGNOSIS — B3731 Acute candidiasis of vulva and vagina: Secondary | ICD-10-CM | POA: Insufficient documentation

## 2013-11-12 MED ORDER — FLUCONAZOLE 150 MG PO TABS: 150.0000 mg | ORAL_TABLET | Freq: Once | ORAL | Status: DC

## 2013-11-12 MED ORDER — PANTOPRAZOLE SODIUM 40 MG PO TBEC: 40.0000 mg | DELAYED_RELEASE_TABLET | Freq: Two times a day (BID) | ORAL | Status: DC

## 2013-11-12 NOTE — Assessment & Plan Note (Signed)
Resolved

## 2013-11-12 NOTE — Patient Instructions (Addendum)
Continue 2 more weeks of pantoprazole 2 times a week then taper down to 1 tab daily.  Call if symptoms not continuing to resolve.

## 2013-11-12 NOTE — Progress Notes (Signed)
   Subjective:    Patient ID: Jane DraftsAngela B Kostelecky, female    DOB: 10/27/1970, 43 y.o.   MRN: [id number]  HPI  43 year old female presents for follow up on H pylori gatritis s/p triple therapy with pantoprazole, clarithromycin  and  Amoxicillin.  She has completed the antibiotics and continues pantoprazole twice daily.  Today she reports she has noted a significant improvement in symptoms 90% improvement. She has noted some continued bloating, no pain.  No N/V. No fever.  No blood in stool.  Since antibiotics she has had vaginal itching and discharge.   Review of Systems  Constitutional: Negative for fever and fatigue.  HENT: Negative for ear pain.   Eyes: Negative for pain.  Respiratory: Negative for chest tightness and shortness of breath.   Cardiovascular: Negative for chest pain, palpitations and leg swelling.  Gastrointestinal: Negative for abdominal pain.  Genitourinary: Positive for vaginal discharge. Negative for dysuria.       Objective:   Physical Exam  Constitutional: Vital signs are normal. She appears well-developed and well-nourished. She is cooperative.  Non-toxic appearance. She does not appear ill. No distress.  HENT:  Head: Normocephalic.  Right Ear: Hearing, tympanic membrane, external ear and ear canal normal. Tympanic membrane is not erythematous, not retracted and not bulging.  Left Ear: Hearing, tympanic membrane, external ear and ear canal normal. Tympanic membrane is not erythematous, not retracted and not bulging.  Nose: No mucosal edema or rhinorrhea. Right sinus exhibits no maxillary sinus tenderness and no frontal sinus tenderness. Left sinus exhibits no maxillary sinus tenderness and no frontal sinus tenderness.  Mouth/Throat: Uvula is midline, oropharynx is clear and moist and mucous membranes are normal.  Eyes: Conjunctivae, EOM and lids are normal. Pupils are equal, round, and reactive to light. Lids are everted and swept, no foreign bodies found.    Neck: Trachea normal and normal range of motion. Neck supple. Carotid bruit is not present. No mass and no thyromegaly present.  Cardiovascular: Normal rate, regular rhythm, S1 normal, S2 normal, normal heart sounds, intact distal pulses and normal pulses.  Exam reveals no gallop and no friction rub.   No murmur heard. Pulmonary/Chest: Effort normal and breath sounds normal. Not tachypneic. No respiratory distress. She has no decreased breath sounds. She has no wheezes. She has no rhonchi. She has no rales.  Abdominal: Soft. Normal appearance and bowel sounds are normal. There is no tenderness.  Neurological: She is alert.  Skin: Skin is warm, dry and intact. No rash noted.  Psychiatric: Her speech is normal and behavior is normal. Judgment and thought content normal. Her mood appears not anxious. Cognition and memory are normal. She does not exhibit a depressed mood.          Assessment & Plan:

## 2013-11-12 NOTE — Assessment & Plan Note (Signed)
Improved after triple therapy. Continue BID PPI  for 2 weeks then taper back to 1 tab po daily.

## 2013-11-12 NOTE — Assessment & Plan Note (Signed)
S/P antibitoics. Treat with antifungal.

## 2013-11-12 NOTE — Progress Notes (Signed)
Pre visit review using our clinic review tool, if applicable. No additional management support is needed unless otherwise documented below in the visit note. 

## 2013-11-22 ENCOUNTER — Telehealth: Payer: Self-pay | Admitting: Family Medicine

## 2013-11-22 NOTE — Telephone Encounter (Signed)
Recommend return to her GI given treatment unsuccessful.

## 2013-11-22 NOTE — Telephone Encounter (Signed)
Pt finished abx for H pylori gastritis about 2-3 weeks now. Symptoms of chest pain, burning, acid reflux, pain in stomach, and bloating are coming back. Please advise.

## 2013-11-22 NOTE — Telephone Encounter (Signed)
Patient advised.

## 2013-12-10 ENCOUNTER — Telehealth: Payer: Self-pay | Admitting: *Deleted

## 2013-12-10 DIAGNOSIS — M79605 Pain in left leg: Secondary | ICD-10-CM

## 2013-12-10 DIAGNOSIS — K219 Gastro-esophageal reflux disease without esophagitis: Secondary | ICD-10-CM

## 2013-12-10 MED ORDER — PANTOPRAZOLE SODIUM 40 MG PO TBEC: 40.0000 mg | DELAYED_RELEASE_TABLET | Freq: Every day | ORAL | Status: DC

## 2013-12-10 NOTE — Telephone Encounter (Signed)
Received fax from [facility] requesting PA for Protonix 40 mg, one tablet two times a day.  Per Dr. Daphine Deutscher last office note on 11/12/2013, patient was to continue the Protonix two times a day for two more weeks and then go back to taking it one time a day.  The two times a day dosing should be completed at this point.  [facility] 40 mg with one time a day dosing instructions.

## 2013-12-10 NOTE — Telephone Encounter (Signed)
Agreed -

## 2013-12-19 NOTE — Telephone Encounter (Signed)
[facility] pharmacist called on status of PA for pantoprazole; advised pharmacist as instructed from 12/10/13 note; [facility] 40 mg # 30 x 2 with instructions take 1 tab by mouth daily. Gave this rx over phone.

## 2013-12-23 ENCOUNTER — Telehealth: Payer: Self-pay | Admitting: *Deleted

## 2013-12-23 NOTE — Telephone Encounter (Signed)
Received fax from [facility] [address] on 12/19/2013 requesting PA for Pantoprazole 40 mg.  Completed PA on CoverMyMeds but never received a decision.  So on 12/23/2013, I called the [facility] Help Desk Phone [phone] and was told that this PA had been approved from 11/19/2013 thru 12/19/2014.  UJ#[id number].  [facility]

## 2014-02-04 ENCOUNTER — Ambulatory Visit (INDEPENDENT_AMBULATORY_CARE_PROVIDER_SITE_OTHER): Payer: BC Managed Care – PPO | Admitting: Family Medicine

## 2014-02-04 ENCOUNTER — Encounter: Payer: Self-pay | Admitting: Family Medicine

## 2014-02-04 VITALS — BP 110/66 | HR 86 | Temp 98.4°F | Ht 67.0 in | Wt 236.0 lb

## 2014-02-04 DIAGNOSIS — J302 Other seasonal allergic rhinitis: Secondary | ICD-10-CM

## 2014-02-04 DIAGNOSIS — J019 Acute sinusitis, unspecified: Secondary | ICD-10-CM | POA: Insufficient documentation

## 2014-02-04 DIAGNOSIS — K219 Gastro-esophageal reflux disease without esophagitis: Secondary | ICD-10-CM

## 2014-02-04 DIAGNOSIS — I10 Essential (primary) hypertension: Secondary | ICD-10-CM

## 2014-02-04 DIAGNOSIS — J0100 Acute maxillary sinusitis, unspecified: Secondary | ICD-10-CM

## 2014-02-04 MED ORDER — AMOXICILLIN 500 MG PO CAPS: 1000.0000 mg | ORAL_CAPSULE | Freq: Two times a day (BID) | ORAL | Status: DC

## 2014-02-04 NOTE — Progress Notes (Signed)
Pre visit review using our clinic review tool, if applicable. No additional management support is needed unless otherwise documented below in the visit note. 

## 2014-02-04 NOTE — Progress Notes (Signed)
   Subjective:    Patient ID: Jane Li, female    DOB: 01/04/1971, 43 y.o.   MRN: [id number]  Hypertension  Sinusitis   43 year old female presents for elevated BP measurements and acute sinus symptoms.  Hypertension:   BP here well controlled on lisinopril HCTZ  BP Readings from Last 3 Encounters:  02/04/14 110/66  11/12/13 118/76  10/15/13 116/84  Using medication without problems or lightheadedness: yes, dizzy Chest pain with exertion: chest pain, rest associated with dizziness 2 nights in a row. Still having GERD with meals Edema:None Short of breath:None Average home BPs: at home and at [facility] 120-136/80-93 Other issues: Wt Readings from Last 3 Encounters:  02/04/14 236 lb (107.049 kg)  11/12/13 236 lb 8 oz (107.276 kg)  10/15/13 236 lb (107.049 kg)   Next appt until 05/2013 Dr. Larae GroomsJacob's.    On protonix daily. Was better initially after treating Hpylori then symptoms came back. Using probiotics.   She reports increase congestion in September, worsening in last 7 days, increase nosebleeds, sinus pressure, headache, B ear pain. Low grade fever initially.  Using mucinex, nasonex (1 per night) and loratidine. Using saline mist. No decongestants.    Review of Systems  All other systems reviewed and are negative.      Objective:   Physical Exam  Constitutional: Vital signs are normal. She appears well-developed and well-nourished. She is cooperative.  Non-toxic appearance. She does not appear ill. No distress.  HENT:  Head: Normocephalic.  Right Ear: Hearing, external ear and ear canal normal. Tympanic membrane is not erythematous, not retracted and not bulging. A middle ear effusion is present.  Left Ear: Hearing, external ear and ear canal normal. Tympanic membrane is not erythematous, not retracted and not bulging. A middle ear effusion is present.  Nose: Mucosal edema and rhinorrhea present. Right sinus exhibits maxillary sinus tenderness. Right sinus  exhibits no frontal sinus tenderness. Left sinus exhibits maxillary sinus tenderness. Left sinus exhibits no frontal sinus tenderness.  Mouth/Throat: Uvula is midline, oropharynx is clear and moist and mucous membranes are normal. No oropharyngeal exudate, posterior oropharyngeal edema, posterior oropharyngeal erythema or tonsillar abscesses.  Eyes: Conjunctivae, EOM and lids are normal. Pupils are equal, round, and reactive to light. Lids are everted and swept, no foreign bodies found.  Neck: Trachea normal and normal range of motion. Neck supple. Carotid bruit is not present. No mass and no thyromegaly present.  Cardiovascular: Normal rate, regular rhythm, S1 normal, S2 normal, normal heart sounds, intact distal pulses and normal pulses.  Exam reveals no gallop and no friction rub.   No murmur heard. Pulmonary/Chest: Effort normal and breath sounds normal. Not tachypneic. No respiratory distress. She has no decreased breath sounds. She has no wheezes. She has no rhonchi. She has no rales.  Abdominal: Soft. Normal appearance and bowel sounds are normal. There is no tenderness.  Neurological: She is alert.  Skin: Skin is warm, dry and intact. No rash noted.  Psychiatric: Her speech is normal and behavior is normal. Judgment and thought content normal. Her mood appears not anxious. Cognition and memory are normal. She does not exhibit a depressed mood.          Assessment & Plan:

## 2014-02-04 NOTE — Assessment & Plan Note (Signed)
Poor control. Awaiting appt for endo with GI.  WIll have her try adding ranitidine to PPI.  If not improving we can retest for Hpylori.

## 2014-02-04 NOTE — Patient Instructions (Addendum)
Continue protonix. Add ranitidine OTC  twice daily prn reflux. Follow BP occasionally at home.  Call if persitantly > 150/90. Increase nasal steroid spray to 2 sprays per nostril. Continue nasal saline Complete antibiotics.

## 2014-02-04 NOTE — Assessment & Plan Note (Signed)
Well controlled here. ? Elevated due to sinus infection. Follow. Encouraged exercise, weight loss, healthy eating habits.

## 2014-02-04 NOTE — Assessment & Plan Note (Signed)
Treat with amox x 10 days given low grade fever maillary pain and > 7 days of symptoms

## 2014-02-04 NOTE — Assessment & Plan Note (Signed)
Increase nasal steroid to treatment dose. Nasal saline irrigation, humidifier.

## 2014-02-21 ENCOUNTER — Telehealth: Payer: Self-pay

## 2014-02-21 MED ORDER — FLUCONAZOLE 150 MG PO TABS
150.0000 mg | ORAL_TABLET | Freq: Once | ORAL | Status: DC
Start: 2014-02-21 — End: 2014-03-14

## 2014-02-21 MED ORDER — FLUCONAZOLE 150 MG PO TABS: 150.0000 mg | ORAL_TABLET | Freq: Once | ORAL | Status: DC

## 2014-02-21 NOTE — Telephone Encounter (Signed)
Pt left v/m; pt recently finished antibiotic and now pt has yeast infection and request [facility]. Please advise.

## 2014-02-21 NOTE — Addendum Note (Signed)
Addended by: Kerby NoraBEDSOLE, AMY E on: 02/21/2014 05:07 PM   Modules accepted: Orders

## 2014-02-28 ENCOUNTER — Other Ambulatory Visit: Payer: Self-pay | Admitting: Family Medicine

## 2014-03-10 ENCOUNTER — Telehealth: Payer: Self-pay | Admitting: Gastroenterology

## 2014-03-11 NOTE — Telephone Encounter (Signed)
We had a cancellation for 03/14/14 pt was added and is aware

## 2014-03-14 ENCOUNTER — Ambulatory Visit (INDEPENDENT_AMBULATORY_CARE_PROVIDER_SITE_OTHER): Payer: BC Managed Care – PPO | Admitting: Gastroenterology

## 2014-03-14 ENCOUNTER — Encounter: Payer: Self-pay | Admitting: Gastroenterology

## 2014-03-14 VITALS — BP 124/82 | HR 72 | Ht 67.0 in | Wt 231.6 lb

## 2014-03-14 DIAGNOSIS — R194 Change in bowel habit: Secondary | ICD-10-CM

## 2014-03-14 DIAGNOSIS — R1013 Epigastric pain: Secondary | ICD-10-CM

## 2014-03-14 MED ORDER — PEG-KCL-NACL-NASULF-NA ASC-C 100 G PO SOLR: 1.0000 | Freq: Once | ORAL | Status: DC

## 2014-03-14 NOTE — Patient Instructions (Addendum)
You should change the way you are taking your antiacid medicine (protonix) so that you are taking it 20-30 minutes prior to a decent meal as that is the way the pill is designed to work most effectively. Take ranitidine with lunch and at bedtime. You will be set up for an upper endoscopy for dyspepsia, dysphagia. You will be set up for a colonoscopy for change in bowels, intermittent anal pain.

## 2014-03-14 NOTE — Progress Notes (Signed)
Review of pertinent gastrointestinal problems: 1. Dyspepsia; EGD Dr. Christella HartiganJacobs 04/2011 was normal.  10/2013 blood testing + for H. Pylori, treated with appropriate antibiotics by PCP;    She weighed 223 pounds 3 years ago here in GI.  On the same scale today she weighs 232 (up about 9 pounds in 3 years). US 06/2012 showed normal GB, normal bile ducts (done for slightly elevated AST, abd pain)   HPI: This is a   very pleasant 43 year old woman whom I last saw about 3 years ago  She's been having nighttime discomfort, constantly feels air bubble, pressure in chest. Feels nausea after eating.  Has dysphagia with solids at times.  Feels 'blah' inside.  Takes protonix daily, usually before BF.  Takes H2 blocker twice daily, including.  Takes ibuprofen, not daily, about once weekly at most.  She has also had a change in her bowels over the past several months, more frequent bowel movements, twice awoken at night with excruciating area anal pain lasting for about 1 hour. No overt bleeding however  Past Medical History  Diagnosis Date  . Allergy   . Chronic headaches   . GERD (gastroesophageal reflux disease)   . Hyperlipidemia     Past Surgical History  Procedure Laterality Date  . Cesarean section      Current Outpatient Prescriptions  Medication Sig Dispense Refill  . ibuprofen (ADVIL,MOTRIN) 200 MG tablet Take 600 mg by mouth as needed for pain.    Colleen Can. JUNEL FE 1/20 1-20 MG-MCG tablet     . lisinopril-hydrochlorothiazide (PRINZIDE,ZESTORETIC) 20-12.5 MG per tablet TAKE 1 TABLET BY MOUTH DAILY. 30 tablet 5  . loratadine (CLARITIN) 10 MG tablet Take 10 mg by mouth daily.    . pantoprazole (PROTONIX) 40 MG tablet Take 1 tablet (40 mg total) by mouth daily. 30 tablet 2  . triamcinolone (NASACORT ALLERGY 24HR) 55 MCG/ACT AERO nasal inhaler Place 1 spray into the nose daily.     No current facility-administered medications for this visit.    Allergies as of 03/14/2014 - Review Complete  03/14/2014  Allergen Reaction Noted  . Codeine  01/04/2011    Family History  Problem Relation Age of Onset  . Depression Mother   . Mental illness Mother   . Hypertension Mother   . Arthritis Father   . Thyroid disease Father   . Mental illness Sister   . Breast cancer Maternal Grandmother   . Diabetes Paternal Grandmother     History   Social History  . Marital Status: Married    Spouse Name: N/A    Number of Children: 2  . Years of Education: N/A   Occupational History  . Teacher/Librarian    Social History Main Topics  . Smoking status: Never Smoker   . Smokeless tobacco: Never Used  . Alcohol Use: 0.0 oz/week    0 Not specified per week     Comment: rare  . Drug Use: No  . Sexual Activity: Not on file   Other Topics Concern  . Not on file   Social History Narrative   Regular exercise-yes occasionally   Diet: 3 meals daily, varied, no ff, social etoh,      Physical Exam: BP 124/82 mmHg  Pulse 72  Ht 5\' 7"  (1.702 m)  Wt 231 lb 9.6 oz (105.053 kg)  BMI 36.27 kg/m2 Constitutional: generally well-appearing Psychiatric: alert and oriented x3 Abdomen: soft, nontender, nondistended, no obvious ascites, no peritoneal signs, normal bowel sounds     Assessment  and plan: 43 y.o. female with dyspepsia, intermittent dysphasia, epigastric discomfort  She is on proton pump inhibitor once daily but does not taken at the correct time and relation to meals and so she will change that. She will alter her H2 blockers as well. She does have intermittent dysphagia, dyspepsia. These symptoms improved when she was treated with antibiotics for H. pylori recently. I recommended we repeat EGD at her soonest convenience to check for peptic stricturing, recurrent H pylori.  She has had abnormal change in her bowels as well as 2 episodes of anal discomfort. No overt rectal bleeding but with her change in bowels are like to proceed with colonoscopy at her soonest convenience as  well. Given our scheduling issues at this time of year this will likely be done at a separate time. She is aware of that.

## 2014-03-18 ENCOUNTER — Telehealth: Payer: Self-pay

## 2014-03-18 ENCOUNTER — Ambulatory Visit (AMBULATORY_SURGERY_CENTER): Payer: BC Managed Care – PPO | Admitting: Gastroenterology

## 2014-03-18 ENCOUNTER — Encounter: Payer: Self-pay | Admitting: Gastroenterology

## 2014-03-18 VITALS — BP 128/96 | HR 76 | Temp 98.7°F | Resp 15 | Ht 67.0 in | Wt 231.0 lb

## 2014-03-18 DIAGNOSIS — K2970 Gastritis, unspecified, without bleeding: Secondary | ICD-10-CM

## 2014-03-18 DIAGNOSIS — K2990 Gastroduodenitis, unspecified, without bleeding: Secondary | ICD-10-CM

## 2014-03-18 DIAGNOSIS — K2950 Unspecified chronic gastritis without bleeding: Secondary | ICD-10-CM

## 2014-03-18 DIAGNOSIS — R1013 Epigastric pain: Secondary | ICD-10-CM

## 2014-03-18 HISTORY — PX: UPPER GASTROINTESTINAL ENDOSCOPY: SHX188

## 2014-03-18 MED ORDER — SODIUM CHLORIDE 0.9 % IV SOLN: 500.0000 mL | INTRAVENOUS | Status: DC

## 2014-03-18 NOTE — Patient Instructions (Signed)
YOU HAD AN ENDOSCOPIC PROCEDURE TODAY AT THE [facility]: Refer to the procedure report that was given to you for any specific questions about what was found during the examination.  If the procedure report does not answer your questions, please call your gastroenterologist to clarify.  If you requested that your care partner not be given the details of your procedure findings, then the procedure report has been included in a sealed envelope for you to review at your convenience later.  YOU SHOULD EXPECT: Some feelings of bloating in the abdomen. Passage of more gas than usual.  Walking can help get rid of the air that was put into your GI tract during the procedure and reduce the bloating. If you had a lower endoscopy (such as a colonoscopy or flexible sigmoidoscopy) you may notice spotting of blood in your stool or on the toilet paper. If you underwent a bowel prep for your procedure, then you may not have a normal bowel movement for a few days.  DIET: Your first meal following the procedure should be a light meal and then it is ok to progress to your normal diet.  A half-sandwich or bowl of soup is an example of a good first meal.  Heavy or fried foods are harder to digest and may make you feel nauseous or bloated.  Likewise meals heavy in dairy and vegetables can cause extra gas to form and this can also increase the bloating.  Drink plenty of fluids but you should avoid alcoholic beverages for 24 hours.  ACTIVITY: Your care partner should take you home directly after the procedure.  You should plan to take it easy, moving slowly for the rest of the day.  You can resume normal activity the day after the procedure however you should NOT DRIVE or use heavy machinery for 24 hours (because of the sedation medicines used during the test).    SYMPTOMS TO REPORT IMMEDIATELY: A gastroenterologist can be reached at any hour.  During normal business hours, 8:30 AM to 5:00 PM Monday through Friday,  call [phone].  After hours and on weekends, please call the GI answering service at [phone] who will take a message and have the physician on call contact you.   Following upper endoscopy (EGD)  Vomiting of blood or coffee ground material  New chest pain or pain under the shoulder blades  Painful or persistently difficult swallowing  New shortness of breath  Fever of 100F or higher  Black, tarry-looking stools  FOLLOW UP: If any biopsies were taken you will be contacted by phone or by letter within the next 1-3 weeks.  Call your gastroenterologist if you have not heard about the biopsies in 3 weeks.  Our staff will call the home number listed on your records the next business day following your procedure to check on you and address any questions or concerns that you may have at that time regarding the information given to you following your procedure. This is a courtesy call and so if there is no answer at the home number and we have not heard from you through the emergency physician on call, we will assume that you have returned to your regular daily activities without incident.  SIGNATURES/CONFIDENTIALITY: You and/or your care partner have signed paperwork which will be entered into your electronic medical record.  These signatures attest to the fact that that the information above on your After Visit Summary has been reviewed and is understood.  Full responsibility   of the confidentiality of this discharge information lies with you and/or your care-partner.  Recommendations Await pathology results. Gastritis handout provided to patient/care partner.  

## 2014-03-18 NOTE — Progress Notes (Signed)
Procedure ends, to recovery, report given and VSS. 

## 2014-03-18 NOTE — Progress Notes (Signed)
Called to room to assist during endoscopic procedure.  Patient ID and intended procedure confirmed with present staff. Received instructions for my participation in the procedure from the performing physician.  

## 2014-03-18 NOTE — Telephone Encounter (Signed)
Would like to see a new PCP call 03/19/14 to set up with the pt

## 2014-03-19 ENCOUNTER — Telehealth: Payer: Self-pay

## 2014-03-19 NOTE — Telephone Encounter (Signed)
Left a message at [phone] for the pt to call us back if any questions or concerns. maw

## 2014-03-19 NOTE — Op Note (Signed)
[facility] [address] [place], [id number]   ENDOSCOPY PROCEDURE REPORT  PATIENT: Jane DraftsKutzer, Tanya B  MR#: [id number] BIRTHDATE: 10-Jan-1971 , 43  yrs. old GENDER: female ENDOSCOPIST: Rachael Feeaniel P Hollyanne Schloesser, MD PROCEDURE DATE:  03/18/2014 PROCEDURE:  EGD w/ biopsy ASA CLASS:     Class II INDICATIONS:  dyspepsia, dysphagia; recent H.  pylori serology +, improved with treatment however symptoms recurred. MEDICATIONS: Monitored anesthesia care and Propofol 200 mg IV TOPICAL ANESTHETIC: none  DESCRIPTION OF PROCEDURE: After the risks benefits and alternatives of the procedure were thoroughly explained, informed consent was obtained.  The LB [id number] endoscope was introduced through the mouth and advanced to the second portion of the duodenum , Without limitations.  The instrument was slowly withdrawn as the mucosa was fully examined.    There was mild, non-specific distal gastritis.  This was biopsied and sent to pathology.  The examination was otherwise normal, including normal GE junction.  Retroflexed views revealed no abnormalities.     The scope was then withdrawn from the patient and the procedure completed.  COMPLICATIONS: There were no immediate complications.  ENDOSCOPIC IMPRESSION: There was mild, non-specific distal gastritis.  This was biopsied and sent to pathology.  The examination was otherwise normal, including normal GE junction  RECOMMENDATIONS: Await biopsy results For now, continue AM protonix and twice daily zantac/pepcid   eSigned:  Rachael Feeaniel P Angy Swearengin, MD 03/18/2014 1:26 PM

## 2014-03-20 NOTE — Telephone Encounter (Signed)
Mobile number has a voice mail that has not been set up. Message left at home number

## 2014-03-24 NOTE — Telephone Encounter (Signed)
Unable to reach pt on mobile number, message left on home number.  I will await communication from the pt

## 2014-03-25 ENCOUNTER — Encounter: Payer: Self-pay | Admitting: Gastroenterology

## 2014-04-01 ENCOUNTER — Ambulatory Visit (AMBULATORY_SURGERY_CENTER): Payer: Self-pay | Admitting: *Deleted

## 2014-04-01 ENCOUNTER — Other Ambulatory Visit: Payer: Self-pay | Admitting: Obstetrics and Gynecology

## 2014-04-01 VITALS — Ht 67.0 in | Wt 233.6 lb

## 2014-04-01 DIAGNOSIS — R194 Change in bowel habit: Secondary | ICD-10-CM

## 2014-04-01 MED ORDER — MOVIPREP 100 G PO SOLR: 1.0000 | Freq: Once | ORAL | Status: DC

## 2014-04-01 NOTE — Progress Notes (Signed)
No egg or soy allergy. ewm Pt had propofol with egd 12-8 with no issue. ewm No diet pills. ewm No home 02 use. ewm No issues with past sedation. ewm Pt declined emmi video. ewm

## 2014-04-02 LAB — CYTOLOGY - PAP

## 2014-04-08 ENCOUNTER — Ambulatory Visit: Payer: BC Managed Care – PPO | Admitting: Gastroenterology

## 2014-04-21 ENCOUNTER — Other Ambulatory Visit: Payer: Self-pay | Admitting: Family Medicine

## 2014-04-23 ENCOUNTER — Encounter: Payer: Self-pay | Admitting: Gastroenterology

## 2014-05-16 ENCOUNTER — Encounter: Payer: BC Managed Care – PPO | Admitting: Gastroenterology

## 2014-05-27 ENCOUNTER — Ambulatory Visit (AMBULATORY_SURGERY_CENTER): Payer: BC Managed Care – PPO | Admitting: Gastroenterology

## 2014-05-27 ENCOUNTER — Encounter: Payer: Self-pay | Admitting: Gastroenterology

## 2014-05-27 VITALS — BP 125/88 | HR 71 | Temp 98.5°F | Resp 59 | Ht 67.0 in | Wt 233.0 lb

## 2014-05-27 DIAGNOSIS — R194 Change in bowel habit: Secondary | ICD-10-CM

## 2014-05-27 MED ORDER — SODIUM CHLORIDE 0.9 % IV SOLN: 500.0000 mL | INTRAVENOUS | Status: DC

## 2014-05-27 NOTE — Progress Notes (Signed)
Patient awakening,vss,report to rn 

## 2014-05-27 NOTE — Patient Instructions (Signed)

## 2014-05-27 NOTE — Op Note (Addendum)
[facility] [address] [place], [id number]   COLONOSCOPY PROCEDURE REPORT  PATIENT: Jane Li, Jane Li  MR#: [id number] BIRTHDATE: 05/14/1970 , 44  yrs. old GENDER: female ENDOSCOPIST: Rachael Feeaniel P Rica Heather, MD PROCEDURE DATE:  05/27/2014 PROCEDURE:   Colonoscopy with biopsy First Screening Colonoscopy - Avg.  risk and is 50 yrs.  old or older - No.  Prior Negative Screening - Now for repeat screening. N/A  History of Adenoma - Now for follow-up colonoscopy & has been > or = to 3 yrs.  N/A  Polyps Removed Today? No.  Recommend repeat exam, <10 yrs? No. ASA CLASS:   Class II INDICATIONS:Change in bowels, looser than usual. MEDICATIONS: Monitored anesthesia care and Propofol 200 mg IV  DESCRIPTION OF PROCEDURE:   After the risks benefits and alternatives of the procedure were thoroughly explained, informed consent was obtained.  The digital rectal exam revealed no abnormalities of the rectum.   The LB [id number]  endoscope was introduced through the anus and advanced to the terminal ileum which was intubated for a short distance. No adverse events experienced.   The quality of the prep was excellent.  The instrument was then slowly withdrawn as the colon was fully examined.   COLON FINDINGS: The terminal ileum was normal.  The colon was normal, biopsies were taken randomly and sent to pathology. Retroflexed views revealed no abnormalities. The time to cecum=2 minutes 06 seconds.  Withdrawal time=6 minutes 28 seconds.  The scope was withdrawn and the procedure completed. COMPLICATIONS: There were no immediate complications.  ENDOSCOPIC IMPRESSION: The terminal ileum was normal. The colon was normal, biopsies were taken randomly and sent to pathology  RECOMMENDATIONS: 1. Await final pathology from random colon biopsies.  If these are unhelpful, will likely arange HIDA scan of your GB to estimate GB function (for pain after eating). 2. You should continue  to follow colorectal cancer screening guidelines for "routine risk" patients with a repeat colonoscopy in 10 years.  There is no need for FOBT (stool) testing for at least 5 years.  eSigned:  Rachael Feeaniel P Melah Ebling, MD 05/27/2014 10:16 AM Revised: 05/27/2014 10:16 AM  cc: Kerby NoraAmy Bedsole, MD

## 2014-05-27 NOTE — Progress Notes (Signed)
Called to room to assist during endoscopic procedure.  Patient ID and intended procedure confirmed with present staff. Received instructions for my participation in the procedure from the performing physician.  

## 2014-05-28 ENCOUNTER — Telehealth: Payer: Self-pay | Admitting: *Deleted

## 2014-05-28 NOTE — Telephone Encounter (Signed)
No answer. No identifier. Message left to call if any questions or concerns. 

## 2014-06-13 ENCOUNTER — Telehealth: Payer: Self-pay

## 2014-06-13 DIAGNOSIS — R109 Unspecified abdominal pain: Secondary | ICD-10-CM

## 2014-06-13 NOTE — Telephone Encounter (Signed)
Pt called back for path results, she had received a [place] from [place]. Pt is aware of results. Pt needs HIDA scan scheduled.

## 2014-06-16 NOTE — Telephone Encounter (Signed)
[facility]ty pt needs HIDA scan scheduled.

## 2014-06-16 NOTE — Telephone Encounter (Signed)
Left message on machine to call back  

## 2014-06-17 NOTE — Telephone Encounter (Addendum)
Pt has been instructed  You have been scheduled for a HIDA scan at [facility] (1st floor) on 07/09/14. Please arrive 15 minutes prior to your scheduled appointment at 730 am . Make certain not to have anything to eat or drink at least 6 hours prior to your test. Should this appointment date or time not work well for you, please call radiology scheduling at [phone].  _____________________________________________________________________ hepatobiliary (HIDA) scan is an imaging procedure used to diagnose problems in the liver, gallbladder and bile ducts. In the HIDA scan, a radioactive chemical or tracer is injected into a vein in your arm. The tracer is handled by the liver like bile. Bile is a fluid produced and excreted by your liver that helps your digestive system break down fats in the foods you eat. Bile is stored in your gallbladder and the gallbladder releases the bile when you eat a meal. A special nuclear medicine scanner (gamma camera) tracks the flow of the tracer from your liver into your gallbladder and small intestine.  During your HIDA scan  You'll be asked to change into a hospital gown before your HIDA scan begins. Your health care team will position you on a table, usually on your back. The radioactive tracer is then injected into a vein in your arm.The tracer travels through your bloodstream to your liver, where it's taken up by the bile-producing cells. The radioactive tracer travels with the bile from your liver into your gallbladder and through your bile ducts to your small intestine.You may feel some pressure while the radioactive tracer is injected into your vein. As you lie on the table, a special gamma camera is positioned over your abdomen taking pictures of the tracer as it moves through your body. The gamma camera takes pictures continually for about an hour. You'll need to keep still during the HIDA scan. This can become uncomfortable, but you may find that you can  lessen the discomfort by taking deep breaths and thinking about other things. Tell your health care team if you're uncomfortable. The radiologist will watch on a computer the progress of the radioactive tracer through your body. The HIDA scan may be stopped when the radioactive tracer is seen in the gallbladder and enters your small intestine. This typically takes about an hour. In some cases extra imaging will be performed if original images aren't satisfactory, if morphine is given to help visualize the gallbladder or if the medication CCK is given to look at the contraction of the gallbladder. This test typically takes 2 hours to complete. ________________________________________________________________________

## 2014-06-17 NOTE — Addendum Note (Signed)
Addended by: Donata DuffLEWIS, Arnoldo Hildreth L on: 06/17/2014 11:37 AM   Modules accepted: Orders

## 2014-07-09 ENCOUNTER — Ambulatory Visit (HOSPITAL_COMMUNITY)
Admission: RE | Admit: 2014-07-09 | Discharge: 2014-07-09 | Disposition: A | Discharge status: Home / Self Care | Payer: BC Managed Care – PPO | Source: Ambulatory Visit | Attending: Gastroenterology | Admitting: Gastroenterology

## 2014-07-09 DIAGNOSIS — R109 Unspecified abdominal pain: Secondary | ICD-10-CM

## 2014-07-09 DIAGNOSIS — R110 Nausea: Secondary | ICD-10-CM | POA: Diagnosis not present

## 2014-07-09 DIAGNOSIS — K219 Gastro-esophageal reflux disease without esophagitis: Secondary | ICD-10-CM | POA: Insufficient documentation

## 2014-07-09 MED ORDER — SINCALIDE 5 MCG IJ SOLR
2.1100 ug | Freq: Once | INTRAMUSCULAR | Status: AC
  Administered 2014-07-09: 2.11 ug via INTRAVENOUS

## 2014-07-09 MED ORDER — TECHNETIUM TC 99M MEBROFENIN IV KIT
5.0000 | PACK | Freq: Once | INTRAVENOUS | Status: AC | PRN
  Administered 2014-07-09: 5.1 via INTRAVENOUS

## 2014-08-25 ENCOUNTER — Other Ambulatory Visit: Payer: Self-pay | Admitting: Family Medicine

## 2014-08-25 NOTE — Telephone Encounter (Signed)
Last office visit 02/04/2014.  No future appointments scheduled.  Ok to refill?

## 2014-09-05 ENCOUNTER — Encounter: Payer: Self-pay | Admitting: Family Medicine

## 2014-09-05 ENCOUNTER — Ambulatory Visit (INDEPENDENT_AMBULATORY_CARE_PROVIDER_SITE_OTHER): Payer: BC Managed Care – PPO | Admitting: Family Medicine

## 2014-09-05 VITALS — BP 110/72 | HR 80 | Temp 98.4°F | Ht 67.0 in | Wt 232.2 lb

## 2014-09-05 DIAGNOSIS — M542 Cervicalgia: Secondary | ICD-10-CM

## 2014-09-05 MED ORDER — DICLOFENAC SODIUM 75 MG PO TBEC: 75.0000 mg | DELAYED_RELEASE_TABLET | Freq: Two times a day (BID) | ORAL | Status: DC

## 2014-09-05 MED ORDER — CYCLOBENZAPRINE HCL 10 MG PO TABS: 5.0000 mg | ORAL_TABLET | Freq: Every evening | ORAL | Status: DC | PRN

## 2014-09-05 NOTE — Progress Notes (Signed)
Subjective:     Patient ID: Lavonia DraftsAngela B Ostrom, female   DOB: 08/22/1970, 44 y.o.   MRN: [id number]  HPI Ms. Jamse BelfastKutzer is a 44 y/o F with hx of migraines presenting with neck pain for the past month. She thinks the back of her neck is swollen as well. Pain comes and goes. Described as shooting pain that radiates around the side of her head into her temples. Gets waves of nausea when the pain occurs. No vomiting. Usually feels shooting pain only on the right side of neck/head. No photophobia, phonophobia. Has had a lot of sinus drainage. No known triggers. Has taken ibuprofen, helps some. Has also gone to the chiropractor with some relief. Says that this does not feel like a migraine.  Also gets pain above her collarbones on both sides at times. Also complaining of sore, achy knees. On and off tinglng in her left arm. No other numbness or tingling. Endorses occasional dizziness, night sweats, SOB. Occasional headaches, no changes in frequency lately. Also endorses feeling more fatigued than usual lately.   Review of Systems  Constitutional: Positive for fatigue. Negative for fever and chills.  HENT: Positive for sinus pressure.   Eyes: Negative for photophobia and visual disturbance.  Respiratory: Positive for shortness of breath.   Cardiovascular: Positive for chest pain.  Musculoskeletal: Positive for neck pain.  Neurological: Positive for weakness and light-headedness. Negative for syncope and headaches.       Objective:   Physical Exam  Constitutional: She is oriented to person, place, and time. She appears well-developed and well-nourished.  HENT:  Head: Normocephalic and atraumatic.  Right Ear: Tympanic membrane and external ear normal.  Left Ear: Tympanic membrane and external ear normal.  Mouth/Throat: Oropharynx is clear and moist. No oropharyngeal exudate.  Neck: Normal range of motion. Muscular tenderness present. Carotid bruit is not present.  Cardiovascular: Normal rate, regular  rhythm and normal heart sounds.   No murmur heard. Pulmonary/Chest: Effort normal and breath sounds normal. She has no wheezes.  Musculoskeletal:  Tender to palpation over trapezius muscle on R side  Lymphadenopathy:    She has no cervical adenopathy.  Neurological: She is alert and oriented to person, place, and time. She has normal strength.  Neg spurling's       Assessment:     1. Neck pain:  No red flags for neurologic cause. Likely due to muscle tension/muslce strain Could be due to degenerative joint disease in cervical spine. Possible atypicla migraine.    Plan:     1. Neck pain: Trial of anti-inflammatory, muscle relaxant to use at night. Encouraged stretching and heat.  Info given.     Ruthe MannanAlexis Lasha Echeverria served as Neurosurgeonscribe for Dr. Ermalene SearingBedsole 09/05/14 3:45 pm  Patient seen and examined. Med student acted as Neurosurgeonscribe only.  HPI/ROS/PE and assessment/plan created  By MD and scribed by med student.  Kerby NoraAmy Bedsole MD

## 2014-09-05 NOTE — Progress Notes (Signed)
Pre visit review using our clinic review tool, if applicable. No additional management support is needed unless otherwise documented below in the visit note. 

## 2014-09-05 NOTE — Addendum Note (Signed)
Addended by: Kerby NoraBEDSOLE, Kiani Wurtzel E on: 09/05/2014 04:58 PM   Modules accepted: Orders

## 2014-09-05 NOTE — Patient Instructions (Addendum)
Start taking the anti-inflammatory  Can use the muscle relaxant at bedtime as needed Gentle stretching and heat to relax muscles Call if no improvement in next 3-4 weeks

## 2014-09-19 ENCOUNTER — Ambulatory Visit: Payer: BC Managed Care – PPO | Admitting: Gastroenterology

## 2014-11-25 ENCOUNTER — Ambulatory Visit: Payer: BC Managed Care – PPO | Admitting: Gastroenterology

## 2014-12-04 ENCOUNTER — Other Ambulatory Visit (INDEPENDENT_AMBULATORY_CARE_PROVIDER_SITE_OTHER): Payer: BC Managed Care – PPO

## 2014-12-04 ENCOUNTER — Telehealth: Payer: Self-pay | Admitting: Family Medicine

## 2014-12-04 DIAGNOSIS — E7800 Pure hypercholesterolemia, unspecified: Secondary | ICD-10-CM

## 2014-12-04 LAB — COMPREHENSIVE METABOLIC PANEL
ALK PHOS: 46 U/L (ref 39–117)
ALT: 18 U/L (ref 0–35)
AST: 15 U/L (ref 0–37)
Albumin: 3.9 g/dL (ref 3.5–5.2)
BILIRUBIN TOTAL: 0.3 mg/dL (ref 0.2–1.2)
BUN: 14 mg/dL (ref 6–23)
CALCIUM: 9.5 mg/dL (ref 8.4–10.5)
CO2: 29 mEq/L (ref 19–32)
CREATININE: 0.8 mg/dL (ref 0.40–1.20)
Chloride: 106 mEq/L (ref 96–112)
GFR: 81.88 mL/min (ref >60.00)
Glucose, Bld: 94 mg/dL (ref 70–99)
Potassium: 4.1 mEq/L (ref 3.5–5.1)
Sodium: 141 mEq/L (ref 135–145)
Total Protein: 6.9 g/dL (ref 6.0–8.3)

## 2014-12-04 LAB — LIPID PANEL
Cholesterol: 199 mg/dL (ref 0–200)
HDL: 52.1 mg/dL (ref >39.00)
LDL Cholesterol: 121 mg/dL — ABNORMAL HIGH (ref 0–99)
NONHDL: 146.58
Total CHOL/HDL Ratio: 4
Triglycerides: 127 mg/dL (ref 0.0–149.0)
VLDL: 25.4 mg/dL (ref 0.0–40.0)

## 2014-12-04 NOTE — Telephone Encounter (Signed)
-----   Message from Alvina Chou sent at 11/26/2014 12:01 PM EDT ----- Regarding: Lab orders for Thursdday, 8.25.16 Patient is scheduled for CPX labs, please order future labs, Thanks , Camelia Eng

## 2014-12-19 ENCOUNTER — Ambulatory Visit (INDEPENDENT_AMBULATORY_CARE_PROVIDER_SITE_OTHER)
Admission: RE | Admit: 2014-12-19 | Discharge: 2014-12-19 | Disposition: A | Discharge status: Home / Self Care | Payer: BC Managed Care – PPO | Source: Ambulatory Visit | Attending: Family Medicine | Admitting: Family Medicine

## 2014-12-19 ENCOUNTER — Encounter: Payer: Self-pay | Admitting: Family Medicine

## 2014-12-19 ENCOUNTER — Ambulatory Visit (INDEPENDENT_AMBULATORY_CARE_PROVIDER_SITE_OTHER): Payer: BC Managed Care – PPO | Admitting: Family Medicine

## 2014-12-19 VITALS — BP 100/74 | HR 100 | Temp 98.5°F | Ht 65.5 in | Wt 231.0 lb

## 2014-12-19 DIAGNOSIS — M542 Cervicalgia: Secondary | ICD-10-CM

## 2014-12-19 DIAGNOSIS — R5383 Other fatigue: Secondary | ICD-10-CM | POA: Diagnosis not present

## 2014-12-19 DIAGNOSIS — G43009 Migraine without aura, not intractable, without status migrainosus: Secondary | ICD-10-CM

## 2014-12-19 DIAGNOSIS — Z0000 Encounter for general adult medical examination without abnormal findings: Secondary | ICD-10-CM

## 2014-12-19 DIAGNOSIS — R6882 Decreased libido: Secondary | ICD-10-CM | POA: Diagnosis not present

## 2014-12-19 DIAGNOSIS — S0990XS Unspecified injury of head, sequela: Secondary | ICD-10-CM | POA: Insufficient documentation

## 2014-12-19 LAB — CBC WITH DIFFERENTIAL/PLATELET
BASOS ABS: 0 10*3/uL (ref 0.0–0.1)
Basophils Relative: 0.4 % (ref 0.0–3.0)
EOS PCT: 1 % (ref 0.0–5.0)
Eosinophils Absolute: 0.1 10*3/uL (ref 0.0–0.7)
HCT: 43.8 % (ref 36.0–46.0)
HEMOGLOBIN: 14.7 g/dL (ref 12.0–15.0)
LYMPHS ABS: 3.3 10*3/uL (ref 0.7–4.0)
Lymphocytes Relative: 42 % (ref 12.0–46.0)
MCHC: 33.7 g/dL (ref 30.0–36.0)
MCV: 93.9 fl (ref 78.0–100.0)
Monocytes Absolute: 0.6 10*3/uL (ref 0.1–1.0)
Monocytes Relative: 7.2 % (ref 3.0–12.0)
NEUTROS PCT: 49.4 % (ref 43.0–77.0)
Neutro Abs: 3.9 10*3/uL (ref 1.4–7.7)
Platelets: 298 10*3/uL (ref 150.0–400.0)
RBC: 4.66 Mil/uL (ref 3.87–5.11)
RDW: 13.5 % (ref 11.5–15.5)
WBC: 7.9 10*3/uL (ref 4.0–10.5)

## 2014-12-19 LAB — VITAMIN D 25 HYDROXY (VIT D DEFICIENCY, FRACTURES): VITD: 22.95 ng/mL — ABNORMAL LOW (ref 30.00–100.00)

## 2014-12-19 LAB — VITAMIN B12: VITAMIN B 12: 226 pg/mL (ref 211–911)

## 2014-12-19 LAB — TSH: TSH: 1.71 u[IU]/mL (ref 0.35–4.50)

## 2014-12-19 MED ORDER — TOPIRAMATE 25 MG PO TABS: 25.0000 mg | ORAL_TABLET | Freq: Two times a day (BID) | ORAL | Status: DC

## 2014-12-19 NOTE — Assessment & Plan Note (Signed)
Most likely muscle strain and secondary to poorly controlled migraine. Eval with X-ray given head injury.

## 2014-12-19 NOTE — Patient Instructions (Addendum)
We call with X-ray results.  Start topamax 25 mg daily bedtime.  Can use diclofenac and flexeril as needed. Limit diclofenac and ibuprofen to avoid rebound headache.  Keep migraine diary. Get back on track with regular exercise.  Stop at lab on way out.

## 2014-12-19 NOTE — Assessment & Plan Note (Signed)
Eval with labs. 

## 2014-12-19 NOTE — Assessment & Plan Note (Signed)
Discuss with GYn. Possible lisinopril SE.

## 2014-12-19 NOTE — Progress Notes (Signed)
Subjective:    Patient ID: Jane Li, female    DOB: Sep 13, 1970, 44 y.o.   MRN: [id number]  HPI The patient is here for annual wellness exam and preventative care.   She also has several additional issues.   She continues to report neck pain, but better now. Never got better from end of year last year. Few weeks after seen in spring, she hit head on doorframe. Felt lightheaded.  She continues to have headaches over summer off and on. Also soreness in area where hit head.  HX of migraines, had been well controlled in past few years until now. MRI brain 2012. Sensitive to bright light, no nausea. No specfic triggers except working , stress back to school. Headaches 20 days out of 30 days per month.  Most mild, severe 4 per month.  Has flexeril and diclofenac  or  800 mg ibuprofen helped when used it but she is wary about medication. Has been seeing massage therapist as well, which helps some.  No numbness, or weakness in arms or legs.  In past has used topamax.  She has noted decreased libido.. in last year. She is on OCPs for menstrual migraine. No menses.   Hypertension:   Well controlled on  Lisinopril HCTZ.   BP Readings from Last 3 Encounters:  12/19/14 100/74  09/05/14 110/72  05/27/14 125/88  Using medication without problems or lightheadedness:  none Chest pain with exertion:None Edema:None Short of breath:None Average home BPs: 120-130/80-90 Other issues:   Reviewed labs in detail. LDL at goal < 130. Exercise: None Diet:Good Lab Results  Component Value Date   CHOL 199 12/04/2014   HDL 52.10 12/04/2014   LDLCALC 121* 12/04/2014   LDLDIRECT 146.6 05/06/2013   TRIG 127.0 12/04/2014   CHOLHDL 4 12/04/2014      Review of Systems  Constitutional: Negative for fever and fatigue.  HENT: Negative for ear pain.   Eyes: Negative for pain.  Respiratory: Negative for chest tightness and shortness of breath.   Cardiovascular: Negative for chest pain,  palpitations and leg swelling.  Gastrointestinal: Negative for abdominal pain, diarrhea and constipation.  Genitourinary: Negative for dysuria.  Neurological: Negative for weakness.  Psychiatric/Behavioral: Negative for dysphoric mood. The patient is not nervous/anxious.        Objective:   Physical Exam  Constitutional: She is oriented to person, place, and time. Vital signs are normal. She appears well-developed and well-nourished. She is cooperative.  Non-toxic appearance. She does not appear ill. No distress.  HENT:  Head: Normocephalic.  Right Ear: Hearing, tympanic membrane, external ear and ear canal normal. Tympanic membrane is not erythematous, not retracted and not bulging.  Left Ear: Hearing, tympanic membrane, external ear and ear canal normal. Tympanic membrane is not erythematous, not retracted and not bulging.  Nose: No mucosal edema or rhinorrhea. Right sinus exhibits no maxillary sinus tenderness and no frontal sinus tenderness. Left sinus exhibits no maxillary sinus tenderness and no frontal sinus tenderness.  Mouth/Throat: Uvula is midline, oropharynx is clear and moist and mucous membranes are normal.  Eyes: Conjunctivae, EOM and lids are normal. Pupils are equal, round, and reactive to light. Lids are everted and swept, no foreign bodies found.  Neck: Trachea normal and normal range of motion. Neck supple. Carotid bruit is not present. No thyroid mass and no thyromegaly present.  Cardiovascular: Normal rate, regular rhythm, S1 normal, S2 normal, normal heart sounds, intact distal pulses and normal pulses.  Exam reveals no gallop and no  friction rub.   No murmur heard. Pulmonary/Chest: Effort normal and breath sounds normal. No tachypnea. No respiratory distress. She has no decreased breath sounds. She has no wheezes. She has no rhonchi. She has no rales.  Abdominal: Soft. Normal appearance and bowel sounds are normal. There is no tenderness.  Musculoskeletal:  NEG  SPURLING, NO VERTEBRAL TTP, ttp over right upper trapezius insertion and occiput.  Neurological: She is alert and oriented to person, place, and time. She has normal strength and normal reflexes. No cranial nerve deficit or sensory deficit. She exhibits normal muscle tone. She displays a negative Romberg sign. Coordination and gait normal. GCS eye subscore is 4. GCS verbal subscore is 5. GCS motor subscore is 6.  Nml cerebellar exam   No papilledema  Skin: Skin is warm, dry and intact. No rash noted.  Psychiatric: She has a normal mood and affect. Her speech is normal and behavior is normal. Judgment and thought content normal. Her mood appears not anxious. Cognition and memory are normal. Cognition and memory are not impaired. She does not exhibit a depressed mood. She exhibits normal recent memory and normal remote memory.          Assessment & Plan:  The patient's preventative maintenance and recommended screening tests for an annual wellness exam were reviewed in full today. Brought up to date unless services declined.  Counselled on the importance of diet, exercise, and its role in overall health and mortality. The patient's FH and SH was reviewed, including their home life, tobacco status, and drug and alcohol status.   Vaccine:refused flu, uptodate with  Td  Nonsmoker  Colon: 05/2014: Dr. Larae Grooms, nml  PAP/DVE: per GYN  Mammo: nml 2015 at GYN  HIV screen: not interested

## 2014-12-19 NOTE — Assessment & Plan Note (Signed)
Likely causing worsening of migraine.  Brain rest when headache occurs.

## 2014-12-19 NOTE — Progress Notes (Signed)
Pre visit review using our clinic review tool, if applicable. No additional management support is needed unless otherwise documented below in the visit note. 

## 2014-12-19 NOTE — Assessment & Plan Note (Signed)
Restart topamax. Limit OTC meds. Can use flexeril of NSAIDs for acute HA. If not effective we can consider imitrex again in future.  Follow up in 1 month.

## 2014-12-20 ENCOUNTER — Telehealth: Payer: Self-pay | Admitting: *Deleted

## 2014-12-20 MED ORDER — VITAMIN D (ERGOCALCIFEROL) 1.25 MG (50000 UNIT) PO CAPS
50000.0000 [IU] | ORAL_CAPSULE | ORAL | Status: DC
Start: 2014-12-20 — End: 2015-03-27

## 2014-12-20 NOTE — Telephone Encounter (Signed)
[place] notified as instructed by telephone.  Vit D 50,000 unit prescription sent to [facility] [address]

## 2014-12-20 NOTE — Telephone Encounter (Signed)
-----   Message from Excell Seltzer, MD sent at 12/19/2014  5:46 PM EDT ----- Labs normal except vit D low.. supplement with 12 week course of weekly 50,000 daily vit D3.Marland Kitchen Send in rx please.

## 2015-02-27 ENCOUNTER — Ambulatory Visit
Admission: RE | Admit: 2015-02-27 | Discharge: 2015-02-27 | Disposition: A | Discharge status: Home / Self Care | Payer: BC Managed Care – PPO | Source: Ambulatory Visit | Attending: Family Medicine | Admitting: Family Medicine

## 2015-02-27 ENCOUNTER — Other Ambulatory Visit: Payer: Self-pay | Admitting: Family Medicine

## 2015-02-27 ENCOUNTER — Encounter: Payer: Self-pay | Admitting: Family Medicine

## 2015-02-27 ENCOUNTER — Ambulatory Visit (INDEPENDENT_AMBULATORY_CARE_PROVIDER_SITE_OTHER): Payer: BC Managed Care – PPO | Admitting: Family Medicine

## 2015-02-27 VITALS — BP 110/78 | HR 85 | Temp 98.8°F | Ht 65.5 in | Wt 229.4 lb

## 2015-02-27 DIAGNOSIS — Z79899 Other long term (current) drug therapy: Secondary | ICD-10-CM

## 2015-02-27 DIAGNOSIS — R05 Cough: Secondary | ICD-10-CM | POA: Insufficient documentation

## 2015-02-27 DIAGNOSIS — R059 Cough, unspecified: Secondary | ICD-10-CM

## 2015-02-27 DIAGNOSIS — J209 Acute bronchitis, unspecified: Secondary | ICD-10-CM | POA: Diagnosis not present

## 2015-02-27 MED ORDER — ALBUTEROL SULFATE (2.5 MG/3ML) 0.083% IN NEBU
2.5000 mg | INHALATION_SOLUTION | Freq: Once | RESPIRATORY_TRACT | Status: AC
  Administered 2015-02-27: 2.5 mg via RESPIRATORY_TRACT

## 2015-02-27 MED ORDER — BENZONATATE 200 MG PO CAPS: 200.0000 mg | ORAL_CAPSULE | Freq: Two times a day (BID) | ORAL | Status: DC | PRN

## 2015-02-27 MED ORDER — DOXYCYCLINE HYCLATE 100 MG PO TABS: 100.0000 mg | ORAL_TABLET | Freq: Two times a day (BID) | ORAL | Status: DC

## 2015-02-27 NOTE — Progress Notes (Signed)
Patient ID: Jane Li, female   DOB: 04/10/1971, 44 y.o.   MRN: [id number]  Marikay AlarEric Favor Hackler, MD Phone: [phone]  Jane Draftsngela B Clardy is a 44 y.o. female who presents today for same-day visit.  Patient notes starting around Halloween she had onset of body aches, nausea, and temperature of 100 degrees Fahrenheit. She notes this improved though the next week she developed sinus congestion and chest congestion with some sore throat. She notes she has been coughing up green material. She has noted some mild headache with this. She does note with prolonged coughing fits she has a soreness in her chest. This soreness does not radiate anywhere. She's not short of breath. She did not have any diaphoresis with it. It is not persistent. It is not exertional. She has no chest soreness at this time. She has not been immobile recently or had any surgeries. She's never had a blood clot. She is not coughing up blood. She's not ever had any cardiac issues. She also notes some mild soreness in her left lateral posterior neck. She has no radiation of this pain. She has no numbness and weakness. She not had any fevers. She has full range of motion of her neck. She does note she's been around sick contacts at work and with her daughter. She notes her daughter had mono. She's not had any numbness or weakness. She not had any abdominal pain.  PMH: nonsmoker.   ROS see history of present illness   Objective  Physical Exam Filed Vitals:   02/27/15 1557  BP: 110/78  Pulse: 85  Temp: 98.8 F (37.1 C)    Physical Exam  Constitutional: She is well-developed, well-nourished, and in no distress.  HENT:  Head: Normocephalic and atraumatic.  Right Ear: External ear normal.  Left Ear: External ear normal.  Mouth/Throat: Oropharynx is clear and moist. No oropharyngeal exudate.  Eyes: Conjunctivae are normal. Pupils are equal, round, and reactive to light.  Neck: Normal range of motion. Neck supple.    Cardiovascular: Normal rate, regular rhythm and normal heart sounds.  Exam reveals no gallop and no friction rub.   No murmur heard. Pulmonary/Chest: Effort normal. No respiratory distress. She has wheezes (Minimal scattered wheezes). She has no rales.  Abdominal: Soft. Bowel sounds are normal. She exhibits no distension. There is no tenderness. There is no rebound and no guarding.  Musculoskeletal:  No midline neck tenderness, no step-off, no muscular neck tenderness, full range of motion in the neck  Lymphadenopathy:    She has no cervical adenopathy.  Neurological: She is alert.  CN 2-12 intact, 5/5 strength in bilateral biceps, triceps, grip, quads, hamstrings, plantar and dorsiflexion, sensation to light touch intact in bilateral UE and LE, normal gait, 2+ patellar reflexes  Skin: Skin is warm and dry. She is not diaphoretic.   Patient was given a breathing treatment in the office. She did not note any improvement in her cough following this. She did have some improvement in her wheezes.  Assessment/Plan: Please see individual problem list.  Acute bronchitis Patient's symptoms are most consistent with bronchitis. Likely this started out as a viral illness, though with persistence and purulent productive cough this could possibly be a bacterial infection at this time. Additionally could be mono given recent exposure, though this is felt to be less likely. She does not note much difference with the albuterol nebulizer treatment. Unlikely to be related to PE given a wells score of 0. Unlikely to be cardiac in nature given  atypical nature and association with productive cough. We'll treat the patient with doxycycline. I did discuss obtaining a urine pregnancy test prior to initiation of doxycycline, though patient declined this. We will obtain a chest x-ray to evaluate further for underlying pneumonia. She is given return precautions.    Orders Placed This Encounter  Procedures  . DG Chest  2 View    Standing Status: Future     Number of Occurrences: 1     Standing Expiration Date: 04/28/2016    Order Specific Question:  Reason for Exam (SYMPTOM  OR DIAGNOSIS REQUIRED)    Answer:  Cough, chest congestion for 2 weeks    Order Specific Question:  Is the patient pregnant?    Answer:  No    Order Specific Question:  Preferred imaging location?    Answer:  [facility]  . Monospot    Meds ordered this encounter  Medications  . doxycycline (VIBRA-TABS) 100 MG tablet    Sig: Take 1 tablet (100 mg total) by mouth 2 (two) times daily.    Dispense:  14 tablet    Refill:  0  . benzonatate (TESSALON) 200 MG capsule    Sig: Take 1 capsule (200 mg total) by mouth 2 (two) times daily as needed for cough.    Dispense:  20 capsule    Refill:  0  . albuterol (PROVENTIL) (2.5 MG/3ML) 0.083% nebulizer solution 2.5 mg    Sig:      Marikay Alar

## 2015-02-27 NOTE — Patient Instructions (Addendum)
Nice to meet you. You likely bronchitis, though with her exposure treat her daughter had mono we will check you for this as well. We will obtain a chest x-ray to evaluate given your wheezes. We will treat with antibiotics. Please take a probiotic while you're on this. If you develop shortness of breath, chest pain, palpitations, fever, cough of blood, calf swelling, numbness, weakness, tingling, headache, or any new or change in symptoms please seek medical attention.

## 2015-02-27 NOTE — Assessment & Plan Note (Signed)
Patient's symptoms are most consistent with bronchitis. Likely this started out as a viral illness, though with persistence and purulent productive cough this could possibly be a bacterial infection at this time. Additionally could be mono given recent exposure, though this is felt to be less likely. She does not note much difference with the albuterol nebulizer treatment. Unlikely to be related to PE given a wells score of 0. Unlikely to be cardiac in nature given atypical nature and association with productive cough. We'll treat the patient with doxycycline. I did discuss obtaining a urine pregnancy test prior to initiation of doxycycline, though patient declined this. We will obtain a chest x-ray to evaluate further for underlying pneumonia. She is given return precautions.

## 2015-02-27 NOTE — Progress Notes (Signed)
Pre visit review using our clinic review tool, if applicable. No additional management support is needed unless otherwise documented below in the visit note. 

## 2015-02-28 LAB — MONONUCLEOSIS SCREEN: HETEROPHILE, MONO SCREEN: NEGATIVE

## 2015-03-01 ENCOUNTER — Other Ambulatory Visit: Payer: Self-pay | Admitting: Family Medicine

## 2015-03-02 ENCOUNTER — Telehealth: Payer: Self-pay | Admitting: Family Medicine

## 2015-03-02 DIAGNOSIS — J929 Pleural plaque without asbestos: Secondary | ICD-10-CM

## 2015-03-02 NOTE — Telephone Encounter (Signed)
Attempted to call the patient to discuss her chest x-ray results. There is no answer. I left a message asking her to call back to the office. We will await her call back.

## 2015-03-03 NOTE — Telephone Encounter (Signed)
Pt call to get xray results

## 2015-03-03 NOTE — Telephone Encounter (Signed)
Pt called back returning Dr Birdie SonsSonnenberg call. 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 Thank You!

## 2015-03-03 NOTE — Telephone Encounter (Signed)
Patient returned a phone call from Dr. Birdie SonsSonnenberg, Patient requested Xray results.

## 2015-03-04 ENCOUNTER — Other Ambulatory Visit: Payer: Self-pay

## 2015-03-04 NOTE — Telephone Encounter (Signed)
Spoke with patient regarding chest x-ray results. Advised that there are no signs of pneumonia. Did discuss that there is thickening of the pleura in the bilateral apices of her lungs. Discussed that this could be age-related thickening, though is difficult to say given that she is only in her 40s whether or not this would be age-related or related to some other issue.  Discussed obtaining a CT scan to evaluate this further to ensure that there is no serious cause for this. We will order a CT scan and call her once the results of this are returned. We'll additionally send a message to lab personnel to look into why her Monospot has not returned yet.

## 2015-03-04 NOTE — Telephone Encounter (Signed)
Sent to wrong doc.

## 2015-03-04 NOTE — Telephone Encounter (Signed)
Monospot results was given to Dr. Birdie SonsSonnenberg

## 2015-03-09 ENCOUNTER — Encounter: Payer: Self-pay | Admitting: Family Medicine

## 2015-03-11 ENCOUNTER — Ambulatory Visit
Admission: RE | Admit: 2015-03-11 | Discharge: 2015-03-11 | Disposition: A | Discharge status: Home / Self Care | Payer: BC Managed Care – PPO | Source: Ambulatory Visit | Attending: Family Medicine | Admitting: Family Medicine

## 2015-03-11 DIAGNOSIS — J941 Fibrothorax: Secondary | ICD-10-CM | POA: Insufficient documentation

## 2015-03-11 DIAGNOSIS — J929 Pleural plaque without asbestos: Secondary | ICD-10-CM

## 2015-03-11 HISTORY — DX: Essential (primary) hypertension: I10

## 2015-03-11 MED ORDER — IOHEXOL 350 MG/ML SOLN
75.0000 mL | Freq: Once | INTRAVENOUS | Status: AC | PRN
  Administered 2015-03-11: 75 mL via INTRAVENOUS

## 2015-03-12 ENCOUNTER — Telehealth: Payer: Self-pay | Admitting: *Deleted

## 2015-03-12 NOTE — Telephone Encounter (Signed)
Spoke to patient and gave CT results

## 2015-03-12 NOTE — Telephone Encounter (Signed)
Patient has request her CT scan results from yesterday. 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

## 2015-03-26 ENCOUNTER — Ambulatory Visit: Payer: BC Managed Care – PPO | Admitting: Family Medicine

## 2015-03-27 ENCOUNTER — Encounter: Payer: Self-pay | Admitting: Family Medicine

## 2015-03-27 ENCOUNTER — Ambulatory Visit (INDEPENDENT_AMBULATORY_CARE_PROVIDER_SITE_OTHER): Payer: BC Managed Care – PPO | Admitting: Family Medicine

## 2015-03-27 VITALS — BP 120/88 | HR 94 | Temp 98.2°F | Ht 65.5 in | Wt 228.5 lb

## 2015-03-27 DIAGNOSIS — N760 Acute vaginitis: Secondary | ICD-10-CM | POA: Diagnosis not present

## 2015-03-27 DIAGNOSIS — J9801 Acute bronchospasm: Secondary | ICD-10-CM | POA: Diagnosis not present

## 2015-03-27 LAB — POCT WET PREP WITH KOH
CLUE CELLS WET PREP PER HPF POC: NEGATIVE
KOH Prep POC: NEGATIVE
TRICHOMONAS UA: NEGATIVE
WBC Wet Prep HPF POC: NEGATIVE
Yeast Wet Prep HPF POC: NEGATIVE

## 2015-03-27 MED ORDER — FLUCONAZOLE 150 MG PO TABS: 150.0000 mg | ORAL_TABLET | Freq: Once | ORAL | Status: DC

## 2015-03-27 MED ORDER — VITAMIN D (ERGOCALCIFEROL) 1.25 MG (50000 UNIT) PO CAPS: 50000.0000 [IU] | ORAL_CAPSULE | ORAL | Status: DC

## 2015-03-27 NOTE — Assessment & Plan Note (Signed)
Still likely yeast causing symptoms after recnet multiple antibiotics. Also irritant derm possible cause.  Treat with oral diflucan x 1 , if not improving apply topical steroid cream to affected are twice daily x 1 week.

## 2015-03-27 NOTE — Assessment & Plan Note (Signed)
Likely cause of continued cough. Time and symptom care.

## 2015-03-27 NOTE — Progress Notes (Signed)
Subjective:    Patient ID: Jane Li, female    DOB: 10/21/1970, 44 y.o.   MRN: [id number]  Vaginal Itching The patient's primary symptoms include genital itching. Primary symptoms comment: area of itching is anterior, not inside vagina.. This is a recurrent problem. The current episode started 1 to 4 weeks ago. The problem occurs intermittently. The problem has been waxing and waning. The patient is experiencing no pain. She is not pregnant. Associated symptoms include frequency. Pertinent negatives include no abdominal pain, back pain, constipation, diarrhea, dysuria, fever, flank pain, headaches, hematuria, nausea, painful intercourse, sore throat or urgency. Associated symptoms comments: Coughing causes incontinence.. Nothing aggravates the symptoms. She has tried antifungals ( diflucan x 2, vaginsil cream) for the symptoms. She is sexually active (married, no known ex(osure). No, her partner does not have an STD. Her past medical history is significant for vaginosis.    Treated for sinus infection with antibiotics in 02/09/2015, leftover.  1 week later saw [place] [facility]. Dx with bronchitis. Treated with doxycycline and tessalon perles.  CXR neg except thickend thissue.  Chest CT: showed minor scarring.    Social History /Family History/Past Medical History reviewed and updated if needed.   Review of Systems  Constitutional: Negative for fever.  HENT: Negative for sore throat.   Gastrointestinal: Negative for nausea, abdominal pain, diarrhea and constipation.  Genitourinary: Positive for frequency. Negative for dysuria, urgency, hematuria and flank pain.  Musculoskeletal: Negative for back pain.  Neurological: Negative for headaches.       Objective:   Physical Exam  Constitutional: Vital signs are normal. She appears well-developed and well-nourished. She is cooperative.  Non-toxic appearance. She does not appear ill. No distress.  HENT:  Head: Normocephalic.  Right  Ear: Hearing, tympanic membrane, external ear and ear canal normal. Tympanic membrane is not erythematous, not retracted and not bulging.  Left Ear: Hearing, tympanic membrane, external ear and ear canal normal. Tympanic membrane is not erythematous, not retracted and not bulging.  Nose: No mucosal edema or rhinorrhea. Right sinus exhibits no maxillary sinus tenderness and no frontal sinus tenderness. Left sinus exhibits no maxillary sinus tenderness and no frontal sinus tenderness.  Mouth/Throat: Uvula is midline, oropharynx is clear and moist and mucous membranes are normal.  Eyes: Conjunctivae, EOM and lids are normal. Pupils are equal, round, and reactive to light. Lids are everted and swept, no foreign bodies found.  Neck: Trachea normal and normal range of motion. Neck supple. Carotid bruit is not present. No thyroid mass and no thyromegaly present.  Cardiovascular: Normal rate, regular rhythm, S1 normal, S2 normal, normal heart sounds, intact distal pulses and normal pulses.  Exam reveals no gallop and no friction rub.   No murmur heard. Pulmonary/Chest: Effort normal and breath sounds normal. No tachypnea. No respiratory distress. She has no decreased breath sounds. She has no wheezes. She has no rhonchi. She has no rales.  Abdominal: Soft. Normal appearance and bowel sounds are normal. There is no tenderness.  Genitourinary:    There is no tenderness or lesion on the right labia. There is no tenderness or lesion on the left labia. There is erythema in the vagina. Vaginal discharge found.  erythema  Neurological: She is alert.  Skin: Skin is warm, dry and intact. No rash noted.  Psychiatric: Her speech is normal and behavior is normal. Judgment and thought content normal. Her mood appears not anxious. Cognition and memory are normal. She does not exhibit a depressed mood.  Assessment & Plan:

## 2015-03-27 NOTE — Patient Instructions (Signed)
Treat with oral diflucan x 1 , if not improving apply topical steroid cream to affected are twice daily x 1 week. Call or see GYn if still not improving.

## 2015-03-27 NOTE — Progress Notes (Signed)
Pre visit review using our clinic review tool, if applicable. No additional management support is needed unless otherwise documented below in the visit note. 

## 2015-09-16 ENCOUNTER — Other Ambulatory Visit: Payer: Self-pay | Admitting: Family Medicine

## 2015-09-23 ENCOUNTER — Other Ambulatory Visit: Payer: Self-pay | Admitting: Family Medicine

## 2015-09-23 NOTE — Telephone Encounter (Signed)
Last office visit 03/17/2015.  Last visit that addressed HTN 02/04/2014.  Refill?

## 2015-09-24 NOTE — Telephone Encounter (Signed)
Pt request status of refill for lisinopril HCTZ; advised refill already done and pt will ck with pharmacy.

## 2015-10-06 ENCOUNTER — Ambulatory Visit (INDEPENDENT_AMBULATORY_CARE_PROVIDER_SITE_OTHER): Payer: BC Managed Care – PPO | Admitting: Family Medicine

## 2015-10-06 ENCOUNTER — Encounter: Payer: Self-pay | Admitting: Family Medicine

## 2015-10-06 VITALS — BP 100/70 | HR 88 | Temp 98.5°F | Ht 65.5 in | Wt 225.5 lb

## 2015-10-06 DIAGNOSIS — G43009 Migraine without aura, not intractable, without status migrainosus: Secondary | ICD-10-CM

## 2015-10-06 DIAGNOSIS — K219 Gastro-esophageal reflux disease without esophagitis: Secondary | ICD-10-CM

## 2015-10-06 DIAGNOSIS — R1013 Epigastric pain: Secondary | ICD-10-CM | POA: Diagnosis not present

## 2015-10-06 LAB — COMPREHENSIVE METABOLIC PANEL
ALBUMIN: 4.3 g/dL (ref 3.5–5.2)
ALK PHOS: 52 U/L (ref 39–117)
ALT: 21 U/L (ref 0–35)
AST: 15 U/L (ref 0–37)
BILIRUBIN TOTAL: 0.3 mg/dL (ref 0.2–1.2)
BUN: 14 mg/dL (ref 6–23)
CO2: 30 mEq/L (ref 19–32)
CREATININE: 0.82 mg/dL (ref 0.40–1.20)
Calcium: 9.9 mg/dL (ref 8.4–10.5)
Chloride: 99 mEq/L (ref 96–112)
GFR: 79.29 mL/min (ref >60.00)
Glucose, Bld: 110 mg/dL — ABNORMAL HIGH (ref 70–99)
Potassium: 3.6 mEq/L (ref 3.5–5.1)
Sodium: 136 mEq/L (ref 135–145)
Total Protein: 7.6 g/dL (ref 6.0–8.3)

## 2015-10-06 LAB — CBC WITH DIFFERENTIAL/PLATELET
BASOS ABS: 0 10*3/uL (ref 0.0–0.1)
BASOS PCT: 0.4 % (ref 0.0–3.0)
EOS ABS: 0.1 10*3/uL (ref 0.0–0.7)
Eosinophils Relative: 0.8 % (ref 0.0–5.0)
HEMATOCRIT: 44.4 % (ref 36.0–46.0)
HEMOGLOBIN: 15 g/dL (ref 12.0–15.0)
LYMPHS PCT: 38.3 % (ref 12.0–46.0)
Lymphs Abs: 3.6 10*3/uL (ref 0.7–4.0)
MCHC: 33.7 g/dL (ref 30.0–36.0)
MCV: 93.1 fl (ref 78.0–100.0)
Monocytes Absolute: 0.6 10*3/uL (ref 0.1–1.0)
Monocytes Relative: 6.5 % (ref 3.0–12.0)
Neutro Abs: 5.1 10*3/uL (ref 1.4–7.7)
Neutrophils Relative %: 54 % (ref 43.0–77.0)
Platelets: 299 10*3/uL (ref 150.0–400.0)
RBC: 4.77 Mil/uL (ref 3.87–5.11)
RDW: 13.9 % (ref 11.5–15.5)
WBC: 9.4 10*3/uL (ref 4.0–10.5)

## 2015-10-06 LAB — LIPASE: Lipase: 13 U/L (ref 11.0–59.0)

## 2015-10-06 MED ORDER — OMEPRAZOLE 40 MG PO CPDR: 40.0000 mg | DELAYED_RELEASE_CAPSULE | Freq: Every day | ORAL | Status: DC

## 2015-10-06 NOTE — Progress Notes (Signed)
Pre visit review using our clinic review tool, if applicable. No additional management support is needed unless otherwise documented below in the visit note. 

## 2015-10-06 NOTE — Patient Instructions (Addendum)
Stop ibuprofen.. Use tylenol instead.  Stop at lab on way out.  Increase omeprazole to 40 mg daily. Call if no improvement in 2 weeks. If improved.. Continue for 4-6 weeks then taper off.

## 2015-10-06 NOTE — Assessment & Plan Note (Signed)
Increase prilosec to 40 mg daily, avoid triggers and acidic foods.

## 2015-10-06 NOTE — Assessment & Plan Note (Signed)
Rx written to continue massage therapy.

## 2015-10-06 NOTE — Assessment & Plan Note (Signed)
Eval with labs for pancreatitis given referred pain to back. Consider Hpylori given history if not improving.  MAy need endoscopic eval if not improving to eval for PUD

## 2015-10-06 NOTE — Progress Notes (Signed)
   Subjective:    Patient ID: Jane DraftsAngela B Nyland, female    DOB: 05/20/1970, 45 y.o.   MRN: [id number]  HPI  45 year old female presetns with GERD, flared up in last 2 months. Central chest pain, epigastric pain. Now ongoing daily. No chest pain after exertion.. Only after meals.  Has to eat fast at work.  Feels like fullness in throat and like food gets stuck.  After eating she feels clammy and heart racing. She has been using tums and gas X off and on. She is conerned that there is no specific trigger.. Anything can cause it.  After meals she feels bloated, nausea, worse after eating.  She has been using omeprazole 20 mg tab for 4-5 days.. has helped some.  She has history of GERD and Helicobacter pylori gastritis in 2015, improved after triple therapy.  Using ibuprofen as needed 2 times a week for sinus pressure Has upcoming sinus surgery next week.  Review of Systems  HENT:       Occ pain in anterior neck, off and on  Respiratory: Positive for shortness of breath.        Occ shortness of breath after No cough, no throat clearing.  Musculoskeletal:       Occ pain in right elbow with picking things up, no know fall, no knonw injury, presents for 2 weeks.   Sore under shoulder blade, only to touch or lying on back.       Objective:   Physical Exam  Constitutional: Vital signs are normal. She appears well-developed and well-nourished. She is cooperative.  Non-toxic appearance. She does not appear ill. No distress.  HENT:  Head: Normocephalic.  Right Ear: Hearing, tympanic membrane, external ear and ear canal normal. Tympanic membrane is not erythematous, not retracted and not bulging.  Left Ear: Hearing, tympanic membrane, external ear and ear canal normal. Tympanic membrane is not erythematous, not retracted and not bulging.  Nose: No mucosal edema or rhinorrhea. Right sinus exhibits no maxillary sinus tenderness and no frontal sinus tenderness. Left sinus exhibits no  maxillary sinus tenderness and no frontal sinus tenderness.  Mouth/Throat: Uvula is midline, oropharynx is clear and moist and mucous membranes are normal.  Eyes: Conjunctivae, EOM and lids are normal. Pupils are equal, round, and reactive to light. Lids are everted and swept, no foreign bodies found.  Neck: Trachea normal and normal range of motion. Neck supple. Carotid bruit is not present. No thyroid mass and no thyromegaly present.  Cardiovascular: Normal rate, regular rhythm, S1 normal, S2 normal, normal heart sounds, intact distal pulses and normal pulses.  Exam reveals no gallop and no friction rub.   No murmur heard. Pulmonary/Chest: Effort normal and breath sounds normal. No tachypnea. No respiratory distress. She has no decreased breath sounds. She has no wheezes. She has no rhonchi. She has no rales.  Abdominal: Soft. Normal appearance and bowel sounds are normal. There is tenderness in the epigastric area.  Neurological: She is alert.  Skin: Skin is warm, dry and intact. No rash noted.  Psychiatric: Her speech is normal and behavior is normal. Judgment and thought content normal. Her mood appears not anxious. Cognition and memory are normal. She does not exhibit a depressed mood.          Assessment & Plan:

## 2015-10-06 NOTE — Addendum Note (Signed)
Addended by: Damita LackLORING, Caily Rakers S on: 10/06/2015 02:22 PM   Modules accepted: Orders, Medications

## 2015-10-08 ENCOUNTER — Ambulatory Visit: Payer: BC Managed Care – PPO | Admitting: Anesthesiology

## 2015-10-08 ENCOUNTER — Encounter
Admission: RE | Disposition: A | Discharge status: Home / Self Care | Payer: Self-pay | Source: Ambulatory Visit | Attending: Otolaryngology

## 2015-10-08 ENCOUNTER — Ambulatory Visit
Admission: RE | Admit: 2015-10-08 | Discharge: 2015-10-08 | Disposition: A | Discharge status: Home / Self Care | Payer: BC Managed Care – PPO | Source: Ambulatory Visit | Attending: Otolaryngology | Admitting: Otolaryngology

## 2015-10-08 DIAGNOSIS — E669 Obesity, unspecified: Secondary | ICD-10-CM | POA: Diagnosis not present

## 2015-10-08 DIAGNOSIS — Z818 Family history of other mental and behavioral disorders: Secondary | ICD-10-CM | POA: Insufficient documentation

## 2015-10-08 DIAGNOSIS — Z8249 Family history of ischemic heart disease and other diseases of the circulatory system: Secondary | ICD-10-CM | POA: Diagnosis not present

## 2015-10-08 DIAGNOSIS — J342 Deviated nasal septum: Secondary | ICD-10-CM | POA: Insufficient documentation

## 2015-10-08 DIAGNOSIS — Z8261 Family history of arthritis: Secondary | ICD-10-CM | POA: Diagnosis not present

## 2015-10-08 DIAGNOSIS — J343 Hypertrophy of nasal turbinates: Secondary | ICD-10-CM | POA: Diagnosis not present

## 2015-10-08 DIAGNOSIS — Z79899 Other long term (current) drug therapy: Secondary | ICD-10-CM | POA: Insufficient documentation

## 2015-10-08 DIAGNOSIS — Z6836 Body mass index (BMI) 36.0-36.9, adult: Secondary | ICD-10-CM | POA: Diagnosis not present

## 2015-10-08 DIAGNOSIS — I10 Essential (primary) hypertension: Secondary | ICD-10-CM | POA: Insufficient documentation

## 2015-10-08 DIAGNOSIS — J329 Chronic sinusitis, unspecified: Secondary | ICD-10-CM | POA: Diagnosis not present

## 2015-10-08 DIAGNOSIS — K219 Gastro-esophageal reflux disease without esophagitis: Secondary | ICD-10-CM | POA: Diagnosis not present

## 2015-10-08 DIAGNOSIS — Z885 Allergy status to narcotic agent status: Secondary | ICD-10-CM | POA: Insufficient documentation

## 2015-10-08 DIAGNOSIS — R51 Headache: Secondary | ICD-10-CM | POA: Insufficient documentation

## 2015-10-08 HISTORY — PX: MAXILLARY ANTROSTOMY: SHX2003

## 2015-10-08 HISTORY — PX: TURBINATE REDUCTION: SHX6157

## 2015-10-08 HISTORY — PX: FRONTAL SINUS EXPLORATION: SHX6591

## 2015-10-08 HISTORY — DX: Obesity, unspecified: E66.9

## 2015-10-08 HISTORY — PX: SEPTOPLASTY: SHX2393

## 2015-10-08 HISTORY — DX: Deviated nasal septum: J34.2

## 2015-10-08 HISTORY — DX: Motion sickness, initial encounter: T75.3XXA

## 2015-10-08 HISTORY — DX: Reserved for inherently not codable concepts without codable children: IMO0001

## 2015-10-08 HISTORY — PX: IMAGE GUIDED SINUS SURGERY: SHX6570

## 2015-10-08 HISTORY — DX: Chronic sinusitis, unspecified: J32.9

## 2015-10-08 HISTORY — DX: Hypertrophy of nasal turbinates: J34.3

## 2015-10-08 SURGERY — SINUS SURGERY, WITH IMAGING GUIDANCE
Anesthesia: General | Site: Nose | Wound class: Clean Contaminated

## 2015-10-08 MED ORDER — SCOPOLAMINE 1 MG/3DAYS TD PT72
1.0000 | MEDICATED_PATCH | Freq: Once | TRANSDERMAL | Status: DC
  Administered 2015-10-08: 1.5 mg via TRANSDERMAL

## 2015-10-08 MED ORDER — TRAMADOL HCL 50 MG PO TABS
50.0000 mg | ORAL_TABLET | Freq: Once | ORAL | Status: AC
  Administered 2015-10-08: 50 mg via ORAL

## 2015-10-08 MED ORDER — OXYMETAZOLINE HCL 0.05 % NA SOLN
2.0000 | Freq: Once | NASAL | Status: AC
  Administered 2015-10-08: 2 via NASAL

## 2015-10-08 MED ORDER — DEXAMETHASONE SODIUM PHOSPHATE 4 MG/ML IJ SOLN
INTRAMUSCULAR | Status: DC | PRN
  Administered 2015-10-08: 10 mg via INTRAVENOUS

## 2015-10-08 MED ORDER — FENTANYL CITRATE (PF) 100 MCG/2ML IJ SOLN
INTRAMUSCULAR | Status: DC | PRN
  Administered 2015-10-08 (×2): 50 ug via INTRAVENOUS
  Administered 2015-10-08: 100 ug via INTRAVENOUS

## 2015-10-08 MED ORDER — ACETAMINOPHEN 10 MG/ML IV SOLN
1000.0000 mg | Freq: Once | INTRAVENOUS | Status: AC
  Administered 2015-10-08: 1000 mg via INTRAVENOUS

## 2015-10-08 MED ORDER — MIDAZOLAM HCL 5 MG/5ML IJ SOLN
INTRAMUSCULAR | Status: DC | PRN
Start: 2015-10-08 — End: 2015-10-08
  Administered 2015-10-08: 2 mg via INTRAVENOUS

## 2015-10-08 MED ORDER — ONDANSETRON HCL 4 MG/2ML IJ SOLN
INTRAMUSCULAR | Status: DC | PRN
  Administered 2015-10-08: 4 mg via INTRAVENOUS

## 2015-10-08 MED ORDER — LABETALOL HCL 5 MG/ML IV SOLN
10.0000 mg | Freq: Once | INTRAVENOUS | Status: AC
  Administered 2015-10-08: 10 mg via INTRAVENOUS

## 2015-10-08 MED ORDER — FENTANYL CITRATE (PF) 100 MCG/2ML IJ SOLN
25.0000 ug | INTRAMUSCULAR | Status: AC | PRN
  Administered 2015-10-08 (×4): 25 ug via INTRAVENOUS

## 2015-10-08 MED ORDER — PROMETHAZINE HCL 25 MG/ML IJ SOLN
6.2500 mg | Freq: Once | INTRAMUSCULAR | Status: AC
  Administered 2015-10-08: 6.25 mg via INTRAVENOUS

## 2015-10-08 MED ORDER — PROPOFOL 10 MG/ML IV BOLUS
INTRAVENOUS | Status: DC | PRN
Start: 2015-10-08 — End: 2015-10-08
  Administered 2015-10-08: 200 mg via INTRAVENOUS

## 2015-10-08 MED ORDER — PHENYLEPHRINE HCL 0.5 % NA SOLN
NASAL | Status: DC | PRN
  Administered 2015-10-08: 30 mL via TOPICAL

## 2015-10-08 MED ORDER — SUCCINYLCHOLINE CHLORIDE 20 MG/ML IJ SOLN
INTRAMUSCULAR | Status: DC | PRN
  Administered 2015-10-08: 100 mg via INTRAVENOUS

## 2015-10-08 MED ORDER — LIDOCAINE HCL (CARDIAC) 20 MG/ML IV SOLN
INTRAVENOUS | Status: DC | PRN
  Administered 2015-10-08: 50 mg via INTRAVENOUS

## 2015-10-08 MED ORDER — ROCURONIUM BROMIDE 100 MG/10ML IV SOLN
INTRAVENOUS | Status: DC | PRN
  Administered 2015-10-08: 30 mg via INTRAVENOUS

## 2015-10-08 MED ORDER — GLYCOPYRROLATE 0.2 MG/ML IJ SOLN
INTRAMUSCULAR | Status: DC | PRN
  Administered 2015-10-08: 0.2 mg via INTRAVENOUS

## 2015-10-08 MED ORDER — ONDANSETRON HCL 4 MG/2ML IJ SOLN
4.0000 mg | Freq: Once | INTRAMUSCULAR | Status: AC | PRN
  Administered 2015-10-08: 4 mg via INTRAVENOUS

## 2015-10-08 MED ORDER — LACTATED RINGERS IV SOLN
INTRAVENOUS | Status: DC
  Administered 2015-10-08: 09:00:00 via INTRAVENOUS

## 2015-10-08 MED ORDER — LIDOCAINE-EPINEPHRINE 1 %-1:100000 IJ SOLN
INTRAMUSCULAR | Status: DC | PRN
  Administered 2015-10-08: 6 mL

## 2015-10-08 MED ORDER — CEFAZOLIN SODIUM-DEXTROSE 2-4 GM/100ML-% IV SOLN
2.0000 g | Freq: Once | INTRAVENOUS | Status: AC
  Administered 2015-10-08: 2 g via INTRAVENOUS

## 2015-10-08 SURGICAL SUPPLY — 44 items
BALLOON SINUPLASTY SYSTEM (BALLOONS) ×4 IMPLANT
BATTERY INSTRU NAVIGATION (MISCELLANEOUS) ×16 IMPLANT
BLADE SURG 15 STRL LF DISP TIS (BLADE) IMPLANT
BLADE SURG 15 STRL SS (BLADE)
CANISTER SUCT 1200ML W/VALVE (MISCELLANEOUS) ×4 IMPLANT
CATH IV 18X1 1/4 SAFELET (CATHETERS) ×4 IMPLANT
COAG SUCT 10F 3.5MM HAND CTRL (MISCELLANEOUS) ×4 IMPLANT
COAGULATOR SUCT 8FR VV (MISCELLANEOUS) ×4 IMPLANT
DEVICE INFLATION SEID (MISCELLANEOUS) ×4 IMPLANT
DRAPE HEAD BAR (DRAPES) ×4 IMPLANT
DRESSING NASL FOAM PST OP SINU (MISCELLANEOUS) IMPLANT
DRSG NASAL FOAM POST OP SINU (MISCELLANEOUS)
GLOVE PI ULTRA LF STRL 7.5 (GLOVE) ×6 IMPLANT
GLOVE PI ULTRA NON LATEX 7.5 (GLOVE) ×2
IRRIGATOR 4MM STR (IRRIGATION / IRRIGATOR) IMPLANT
IV CATH 18X1 1/4 SAFELET (CATHETERS) ×3
IV NS 500ML (IV SOLUTION) ×1
IV NS 500ML BAXH (IV SOLUTION) ×3 IMPLANT
KIT ROOM TURNOVER OR (KITS) ×4 IMPLANT
NAVIGATION MASK REG  ST (MISCELLANEOUS) ×4 IMPLANT
NEEDLE HYPO 25GX1X1/2 BEV (NEEDLE) ×4 IMPLANT
NEEDLE SPNL 25GX3.5 QUINCKE BL (NEEDLE) ×4 IMPLANT
NS IRRIG 500ML POUR BTL (IV SOLUTION) ×4 IMPLANT
PACK DRAPE NASAL/ENT (PACKS) ×4 IMPLANT
PACKING NASAL EPIS 4X2.4 XEROG (MISCELLANEOUS) ×4 IMPLANT
PAD GROUND ADULT SPLIT (MISCELLANEOUS) ×4 IMPLANT
PATTIES SURGICAL .5 X3 (DISPOSABLE) ×4 IMPLANT
SET HANDPIECE IRR DIEGO (MISCELLANEOUS) ×4 IMPLANT
SOL ANTI-FOG 6CC FOG-OUT (MISCELLANEOUS) ×3 IMPLANT
SOL FOG-OUT ANTI-FOG 6CC (MISCELLANEOUS) ×1
SPLINT NASAL SEPTAL BLV .50 ST (MISCELLANEOUS) ×4 IMPLANT
STRAP BODY AND KNEE 60X3 (MISCELLANEOUS) ×4 IMPLANT
SUT CHROMIC 3-0 (SUTURE) ×1
SUT CHROMIC 3-0 KS 27XMFL CR (SUTURE) ×3
SUT ETHILON 3-0 KS 30 BLK (SUTURE) ×4 IMPLANT
SUT ETHILON 4-0 (SUTURE)
SUT ETHILON 4-0 FS2 18XMFL BLK (SUTURE)
SUT PLAIN GUT 4-0 (SUTURE) ×4 IMPLANT
SUTURE CHRMC 3-0 KS 27XMFL CR (SUTURE) ×3 IMPLANT
SUTURE ETHLN 4-0 FS2 18XMF BLK (SUTURE) IMPLANT
SYR 3ML LL SCALE MARK (SYRINGE) ×4 IMPLANT
TOWEL OR 17X26 4PK STRL BLUE (TOWEL DISPOSABLE) ×4 IMPLANT
TUBING DECLOG (TUBING) ×4 IMPLANT
WATER STERILE IRR 500ML POUR (IV SOLUTION) ×4 IMPLANT

## 2015-10-08 NOTE — Anesthesia Preprocedure Evaluation (Addendum)
Anesthesia Evaluation  Patient identified by MRN, date of birth, ID band  Reviewed: Allergy & Precautions, H&P , NPO status , Patient's Chart, lab work & pertinent test results  Airway Mallampati: II  TM Distance: >3 FB Neck ROM: full    Dental no notable dental hx.    Pulmonary    Pulmonary exam normal        Cardiovascular hypertension,  Rhythm:regular Rate:Normal     Neuro/Psych  Headaches,    GI/Hepatic GERD  ,  Endo/Other    Renal/GU      Musculoskeletal   Abdominal   Peds  Hematology   Anesthesia Other Findings   Reproductive/Obstetrics                            Anesthesia Physical Anesthesia Plan  ASA: II  Anesthesia Plan: General ETT   Post-op Pain Management:    Induction: Intravenous  Airway Management Planned: Oral ETT  Additional Equipment:   Intra-op Plan:   Post-operative Plan: Extubation in OR  Informed Consent: I have reviewed the patients History and Physical, chart, labs and discussed the procedure including the risks, benefits and alternatives for the proposed anesthesia with the patient or authorized representative who has indicated his/her understanding and acceptance.     Plan Discussed with: CRNA  Anesthesia Plan Comments:        Anesthesia Quick Evaluation

## 2015-10-08 NOTE — H&P (Signed)
  H&P has been reviewed and no changes necessary. To be downloaded later. 

## 2015-10-08 NOTE — Anesthesia Procedure Notes (Signed)
Procedure Name: Intubation Date/Time: 10/08/2015 10:40 AM Performed by: Andee PolesBUSH, Aleph Nickson Pre-anesthesia Checklist: Patient identified, Emergency Drugs available, Suction available, Patient being monitored and Timeout performed Patient Re-evaluated:Patient Re-evaluated prior to inductionOxygen Delivery Method: Circle system utilized Preoxygenation: Pre-oxygenation with 100% oxygen Intubation Type: IV induction Ventilation: Mask ventilation without difficulty Laryngoscope Size: Mac and 3 Grade View: Grade I Tube type: Oral Rae Tube size: 7.0 mm Number of attempts: 1 Placement Confirmation: ETT inserted through vocal cords under direct vision,  positive ETCO2 and breath sounds checked- equal and bilateral Tube secured with: Tape Dental Injury: Teeth and Oropharynx as per pre-operative assessment

## 2015-10-08 NOTE — Anesthesia Postprocedure Evaluation (Signed)
Anesthesia Post Note  Patient: Jane Li  Procedure(s) Performed: Procedure(s) (LRB): IMAGE GUIDED SINUS SURGERY (N/A) SEPTOPLASTY (N/A) TURBINATE REDUCTION BILATERAL (Bilateral) MAXILLARY ANTROSTOMY BILATERAL (Bilateral) FRONTAL SINUS EXPLORATION BILATERAL (Bilateral)  Patient location during evaluation: PACU Anesthesia Type: General Level of consciousness: awake and alert and oriented Pain management: satisfactory to patient Vital Signs Assessment: post-procedure vital signs reviewed and stable Respiratory status: spontaneous breathing, nonlabored ventilation and respiratory function stable Cardiovascular status: blood pressure returned to baseline and stable Postop Assessment: Adequate PO intake and No signs of nausea or vomiting Anesthetic complications: no    Cherly BeachStella, Krystie Leiter J

## 2015-10-08 NOTE — Transfer of Care (Signed)
Immediate Anesthesia Transfer of Care Note  Patient: Jane Li  Procedure(s) Performed: Procedure(s) with comments: IMAGE GUIDED SINUS SURGERY (N/A) - GAVE DISK TO CECE 5/24 DEE UPREG SEPTOPLASTY (N/A) TURBINATE REDUCTION BILATERAL (Bilateral) MAXILLARY ANTROSTOMY BILATERAL (Bilateral) FRONTAL SINUS EXPLORATION BILATERAL (Bilateral) SPHENOIDECTOMY BILATERAL (Bilateral)  Patient Location: PACU  Anesthesia Type: General ETT  Level of Consciousness: awake, alert  and patient cooperative  Airway and Oxygen Therapy: Patient Spontanous Breathing and Patient connected to supplemental oxygen  Post-op Assessment: Post-op Vital signs reviewed, Patient's Cardiovascular Status Stable, Respiratory Function Stable, Patent Airway and No signs of Nausea or vomiting  Post-op Vital Signs: Reviewed and stable  Complications: No apparent anesthesia complications

## 2015-10-08 NOTE — Discharge Instructions (Signed)
[facility] °[facility] °ENDOSCOPIC SINUS SURGERY °[facility] ° °What is Functional Endoscopic Sinus Surgery? ° The Surgery involves making the natural openings of the sinuses larger by removing the bony partitions that separate the sinuses from the nasal cavity.  The natural sinus lining is preserved as much as possible to allow the sinuses to resume normal function after the surgery.  In some patients nasal polyps (excessively swollen lining of the sinuses) may be removed to relieve obstruction of the sinus openings.  The surgery is performed through the nose using lighted scopes, which eliminates the need for incisions on the face.  A septoplasty is a different procedure which is sometimes performed with sinus surgery.  It involves straightening the boy partition that separates the two sides of your nose.  A crooked or deviated septum may need repair if is obstructing the sinuses or nasal airflow.  Turbinate reduction is also often performed during sinus surgery.  The turbinates are bony proturberances from the side walls of the nose which swell and can obstruct the nose in patients with sinus and allergy problems.  Their size can be surgically reduced to help relieve nasal obstruction. ° °What Can Sinus Surgery Do For Me? ° Sinus surgery can reduce the frequency of sinus infections requiring antibiotic treatment.  This can provide improvement in nasal congestion, post-nasal drainage, facial pressure and nasal obstruction.  Surgery will NOT prevent you from ever having an infection again, so it usually only for patients who get infections 4 or more times yearly requiring antibiotics, or for infections that do not clear with antibiotics.  It will not cure nasal allergies, so patients with allergies may still require medication to treat their allergies after surgery. Surgery may improve headaches related to sinusitis, however, some people will continue to  require medication to control sinus headaches related to allergies.  Surgery will do nothing for other forms of headache (migraine, tension or cluster). ° °What Are the Risks of Endoscopic Sinus Surgery? ° Current techniques allow surgery to be performed safely with little risk, however, there are rare complications that patients should be aware of.  Because the sinuses are located around the eyes, there is risk of eye injury, including blindness, though again, this would be quite rare. This is usually a result of bleeding behind the eye during surgery, which puts the vision oat risk, though there are treatments to protect the vision and prevent permanent disrupted by surgery causing a leak of the spinal fluid that surrounds the brain.  More serious complications would include bleeding inside the brain cavity or damage to the brain.  Again, all of these complications are uncommon, and spinal fluid leaks can be safely managed surgically if they occur.  The most common complication of sinus surgery is bleeding from the nose, which may require packing or cauterization of the nose.  Continued sinus have polyps may experience recurrence of the polyps requiring revision surgery.  Alterations of sense of smell or injury to the tear ducts are also rare complications.  ° °What is the Surgery Like, and what is the Recovery? ° The Surgery usually takes a couple of hours to perform, and is usually performed under a general anesthetic (completely asleep).  Patients are usually discharged home after a couple of hours.  Sometimes during surgery it is necessary to pack the nose to control bleeding, and the packing is left in place for 24 - 48 hours, and removed by your surgeon.    If a septoplasty was performed during the procedure, there is often a splint placed which must be removed after 5-7 days.   °Discomfort: Pain is usually mild to moderate, and can be controlled by prescription pain medication or acetaminophen (Tylenol).   Aspirin, Ibuprofen (Advil, Motrin), or Naprosyn (Aleve) should be avoided, as they can cause increased bleeding.  Most patients feel sinus pressure like they have a bad head cold for several days.  Sleeping with your head elevated can help reduce swelling and facial pressure, as can ice packs over the face.  A humidifier may be helpful to keep the mucous and blood from drying in the nose.  ° °Diet: There are no specific diet restrictions, however, you should generally start with clear liquids and a light diet of bland foods because the anesthetic can cause some nausea.  Advance your diet depending on how your stomach feels.  Taking your pain medication with food will often help reduce stomach upset which pain medications can cause. ° °Nasal Saline Irrigation: It is important to remove blood clots and dried mucous from the nose as it is healing.  This is done by having you irrigate the nose at least 3 - 4 times daily with a salt water solution.  We recommend using NeilMed Sinus Rinse (available at the drug store).  Fill the squeeze bottle with the solution, bend over a sink, and insert the tip of the squeeze bottle into the nose ½ of an inch.  Point the tip of the squeeze bottle towards the inside corner of the eye on the same side your irrigating.  Squeeze the bottle and gently irrigate the nose.  If you bend forward as you do this, most of the fluid will flow back out of the nose, instead of down your throat.   The solution should be warm, near body temperature, when you irrigate.   Each time you irrigate, you should use a full squeeze bottle.  ° °Note that if you are instructed to use Nasal Steroid Sprays at any time after your surgery, irrigate with saline BEFORE using the steroid spray, so you do not wash it all out of the nose. °Another product, Nasal Saline Gel (such as AYR Nasal Saline Gel) can be applied in each nostril 3 - 4 times daily to moisture the nose and reduce scabbing or crusting. ° °Bleeding:   Bloody drainage from the nose can be expected for several days, and patients are instructed to irrigate their nose frequently with salt water to help remove mucous and blood clots.  The drainage may be dark red or brown, though some fresh blood may be seen intermittently, especially after irrigation.  Do not blow you nose, as bleeding may occur. If you must sneeze, keep your mouth open to allow air to escape through your mouth. ° °If heavy bleeding occurs: Irrigate the nose with saline to rinse out clots, then spray the nose 3 - 4 times with Afrin Nasal Decongestant Spray.  The spray will constrict the blood vessels to slow bleeding.  Pinch the lower half of your nose shut to apply pressure, and lay down with your head elevated.  Ice packs over the nose may help as well. If bleeding persists despite these measures, you should notify your doctor.  Do not use the Afrin routinely to control nasal congestion after surgery, as it can result in worsening congestion and may affect healing.  ° ° ° °Activity: Return to work varies among patients. Most patients will be   out of work at least 5 - 7 days to recover.  Patient may return to work after they are off of narcotic pain medication, and feeling well enough to perform the functions of their job.  Patients must avoid heavy lifting (over 10 pounds) or strenuous physical for 2 weeks after surgery, so your employer may need to assign you to light duty, or keep you out of work longer if light duty is not possible.  NOTE: you should not drive, operate dangerous machinery, do any mentally demanding tasks or make any important legal or financial decisions while on narcotic pain medication and recovering from the general anesthetic.  °  °Call Your Doctor Immediately if You Have Any of the Following: °1. Bleeding that you cannot control with the above measures °2. Loss of vision, double vision, bulging of the eye or black eyes. °3. Fever over 101 degrees °4. Neck stiffness with  severe headache, fever, nausea and change in mental state. °You are always encourage to call anytime with concerns, however, please call with requests for pain medication refills during office hours. ° °Office Endoscopy: During follow-up visits your doctor will remove any packing or splints that may have been placed and evaluate and clean your sinuses endoscopically.  Topical anesthetic will be used to make this as comfortable as possible, though you may want to take your pain medication prior to the visit.  How often this will need to be done varies from patient to patient.  After complete recovery from the surgery, you may need follow-up endoscopy from time to time, particularly if there is concern of recurrent infection or nasal polyps. ° °General Anesthesia, Adult, Care After °Refer to this sheet in the next few weeks. These instructions provide you with information on caring for yourself after your procedure. Your health care provider may also give you more specific instructions. Your treatment has been planned according to current medical practices, but problems sometimes occur. Call your health care provider if you have any problems or questions after your procedure. °WHAT TO EXPECT AFTER THE PROCEDURE °After the procedure, it is typical to experience: °· Sleepiness. °· Nausea and vomiting. °HOME CARE INSTRUCTIONS °· For the first 24 hours after general anesthesia: °¨ Have a responsible person with you. °¨ Do not drive a car. If you are alone, do not take public transportation. °¨ Do not drink alcohol. °¨ Do not take medicine that has not been prescribed by your health care provider. °¨ Do not sign important papers or make important decisions. °¨ You may resume a normal diet and activities as directed by your health care provider. °· Change bandages (dressings) as directed. °· If you have questions or problems that seem related to general anesthesia, call the hospital and ask for the anesthetist or  anesthesiologist on call. °SEEK MEDICAL CARE IF: °· You have nausea and vomiting that continue the day after anesthesia. °· You develop a rash. °SEEK IMMEDIATE MEDICAL CARE IF:  °· You have difficulty breathing. °· You have chest pain. °· You have any allergic problems. °  °This information is not intended to replace advice given to you by your health care provider. Make sure you discuss any questions you have with your health care provider. °  °Document Released: 07/04/2000 Document Revised: 04/18/2014 Document Reviewed: 07/27/2011 °[employer]vier Interactive Patient Education ©2016 [employer] ° °

## 2015-10-08 NOTE — Op Note (Signed)
10/08/2015  3:52 PM  [id number]   Pre-Op Dx:  Deviated Nasal Septum, Hypertrophic Inferior Turbinates, chronic bilateral maxillary sinusitis, chronic bilateral frontal sinusitis  Post-op Dx: Same  Proc: Nasal Septoplasty, Bilateral Partial Reduction Inferior Turbinates, bilateral endoscopic frontal sinusotomy, bilateral endoscopic maxillary antrostomy, use of image guided system  Surg:  Jane Li  Anes:  GOT  EBL:  150 mL  Comp:  None  Findings: The patient had congestion and hyperemia throughout most the case, where she continued to ooze some any time tissues were touched. Because of this I chose not to attempt to open the sphenoid sinuses. Her ethmoid plate was deviated and her vomer was off to the left side posteriorly  Procedure: With the patient in a comfortable supine position,  general orotracheal anesthesia was induced without difficulty.     The patient received preoperative Afrin spray for topical decongestion and vasoconstriction.  Intravenous prophylactic antibiotics were administered.  At an appropriate level, the patient was placed in a semi-sitting position.  Nasal vibrissae were trimmed.   1% Xylocaine with 1:100,000 epinephrine, 6 cc's, was infiltrated into the anterior floor of the nose, into the nasal spine region, into the membranous columella, and finally into the submucoperichondrial plane of the septum on both sides.  Several minutes were allowed for this to take effect.  Cottoniod pledgetts soaked in Afrin and 4% Xylocaine were placed into both nasal cavities and left while the patient was prepped and draped in the standard fashion.  The materials were removed from the nose and observed to be intact and correct in number.  The nose was inspected with a headlight with the findings as described above. The image guided system was brought in and the CT scan was downloaded from the disc. The template was applied the face and registered to the system. There was 0.6  mm of variance. The suction isthmus were then registered and did not line up with the image guided system. There was off by about one quarter inch. The system was then reregistered again with 0.7 mm of variance, but this time there was good alignment with the suction instruments.  The septum was done first. A left Killian incision was sharply executed and carried down to the caudal edge of the quadrangular cartilage. The mucoperichondrium was elelvated along the quadrangular plate back to the bony-cartilaginous junction. The mucoperiostium was then elevated along the ethmoid plate and the vomer. The boney-catilaginous junction was then split with a freer elevator and the mucoperiosteum was elevated on the opposite side. The mucoperiosteum was then elevated along the maxillary crest as needed to expose the crooked bone of the crest.  Boney spurs of the vomer and maxillary crest were removed with Lenoria Chimeakahashi forceps.  The cartilaginous plate was trimmed along its posterior and inferior borders of about 2 mm of cartilage to free it up inferiorly. Some of the deviated ethmoid plate was then fractured and removed with Takahashi forceps to free up the posterior border of the quadrangular plate and allow it to swing back to the midline. The mucosal flaps were placed back into their anatomic position to allow visualization of the airways. The septum now sat in the midline with an improved airway.  A 3-0 Chromic suture on a Keith needle in used to anchor the inferior septum at the nasal spine with a through and through suture. The mucosal flaps are then sutured together using a through and through whip stitch of 4-0 Plain Gut with a mini-Keith needle. This was used  to close the Rewey incision as well.   The inferior turbinates were then inspected. An incision was created along the inferior aspect of the left inferior turbinate with removal of some of the inferior soft tissue and bone. Electrocautery was used to  control bleeding in the area. The remaining turbinate was then outfractured to open up the airway further. There was no significant bleeding noted. The right turbinate was then trimmed and outfractured in a similar fashion. The right inferior turbinate was larger than the left and more work was done on this side.  The 0 scope was used to visualize the left nasal passage. The septum was straight to make this more open. There was some of oozing from the septum because of the previous repair. The middle turbinate was infractured to visualize the middle meatus. An Acclarent balloon Sinuplasty instrument was used to cannulize the frontal sinus duct. A balloon was then slid over the lighted wire and the frontal sinus to dilate the frontal sinus opening. This was dilated to 12 cm of pressure. It was done several times a different levels to make sure the sinus was opened. There were bone shards and chips removed mucosa along its lower edge there were removed sure the frontal sinus duct was open. There is a significant amount of oozing from this area. Part of the uncinate process was trimmed and the Acclarent balloon system was used to then cannulize the maxillary sinus duct. This was dilated 12 cm of pressure several times also to fracture the opening and make biter. Further bone chips and part of the uncinate process were trimmed here again to widen this opening. A cottonoid pledget was placed here for further vasoconstriction help control oozing.  The 0 scope was then used to visualize the right nasal passage. The middle turbinate was infractured visualize the middle meatus. Some of the uncinate process was trimmed removed to find the frontal sinus duct opening. A guidewire was placed through this and a balloon threaded over the guidewire and dilated 12 cm pressure to widen this out. The frontal sinus duct was then widened further using 45 forceps removal of some bone chips and mucosa around the edges. The  maxillary antrum was then cannulated with the wire and again the balloon was passed over this and dilated 12 cm of pressure several times to widen the maxillary antrum as well. Further pieces of uncinate process and natural ostium were removed and opened the ostium further. A cottonoid pledget was placed here temporarily.  The left side was revisualized with the 0 scope. There was some oozing as the middle turbinate was lateralized little bit look for the sphenoid sinus opening again there is some oozing is felt that it was not in the patient's best interest to try to open up the sphenoid sinuses at this time. Middle turbinate was infractured again and a cottonoid pledget was placed here to help control the vasomotor dilation and oozing. After a few minutes the right side was revisualized and the sinus openings appeared to be clear. The left side was revisualized 0 and 30 scopes and again the openings appeared to be clear. The bleeding seemed to settle down. Xerogel was placed in the middle meatus on both sides to help with controlling of bleeding.  The airways were then visualized and showed open passageways on both sides that were significantly improved compared to before surgery. There was no signifcant bleeding. Nasal splints were applied to both sides of the septum using Xomed 0.56mm regular  sized splints that were trimmed, and then held in position with a 3-0 Nylon through and through suture.  The patient was turned back over to anesthesia, and awakened, extubated, and taken to the PACU in satisfactory condition.  Dispo:   PACU to home  Plan: Ice, elevation, narcotic analgesia, steroid taper, and prophylactic antibiotics for the duration of indwelling nasal foreign bodies.  We will reevaluate the patient in the office in 6 days and remove the septal splints.  Return to work in 10 days, strenuous activities in two weeks.   Jane Li 10/08/2015 3:52 PM

## 2015-10-09 ENCOUNTER — Encounter: Payer: Self-pay | Admitting: Otolaryngology

## 2015-10-14 LAB — SURGICAL PATHOLOGY

## 2015-10-23 ENCOUNTER — Other Ambulatory Visit: Payer: Self-pay | Admitting: Family Medicine

## 2015-12-16 ENCOUNTER — Ambulatory Visit: Payer: BC Managed Care – PPO | Admitting: Gastroenterology

## 2016-01-20 ENCOUNTER — Ambulatory Visit (INDEPENDENT_AMBULATORY_CARE_PROVIDER_SITE_OTHER): Payer: BC Managed Care – PPO | Admitting: Gastroenterology

## 2016-01-20 ENCOUNTER — Encounter: Payer: Self-pay | Admitting: Gastroenterology

## 2016-01-20 VITALS — BP 108/78 | HR 100 | Ht 65.35 in

## 2016-01-20 DIAGNOSIS — R1013 Epigastric pain: Secondary | ICD-10-CM

## 2016-01-20 DIAGNOSIS — K625 Hemorrhage of anus and rectum: Secondary | ICD-10-CM | POA: Diagnosis not present

## 2016-01-20 NOTE — Patient Instructions (Signed)
Stay on prilosec once every morning, shortly before BF meal. Gas ex is safe to take for bloating. Follow up as needed with Dr. Christella HartiganJacobs.

## 2016-01-20 NOTE — Progress Notes (Signed)
Review of pertinent gastrointestinal problems: 1. Dyspepsia; EGD Dr. Christella HartiganJacobs 04/2011 was normal.  10/2013 blood testing + for H. Pylori, treated with appropriate antibiotics by PCP; EGD Dr. Christella HartiganJacobs for dyspepsia, dysphagia 03/2014 found minor h pylori negative gastritis. Follow up HIDA scan showed normal GB EF but + pain with CCK injection. 2. Change in bowel habits: colonoscopy Dr. Christella HartiganJacobs 05/2014; normal TI, normal colon (random bx's normal).  HPI: This is a  very pleasant 45 year old woman whom I last saw about a year and a half ago at the time of colonoscopy.  Chief complaint is dyspepsia, bloating, minor rectal bleeding   Has been feeling well until this past spring.  Had flare ups.  Post prandial chest discomfort, SOB, bloating after any food intake.  Never vomited.  _+ nauseated.  If she didn't eat and only had very small meals that her symptoms we lessened.   She had stopped her PPI around that time.  She saw her PCP about it. And was put back on PPI 40mg  daily.  She clearly feels better with the prilosec.  Stress plays a role, especially with her teenagers.  She has had regular BMs, can be intermittently soft.  She had minor small amount of blood 4 times (dripping).  She had no anal pains around that time.  ROS: complete GI ROS as described in HPI.  Constitutional:  No unintentional weight loss   Past Medical History:  Diagnosis Date  . Allergy   . Chronic headaches    2/WEEK SINUS AND STRESS HEADACHES  . Deviated nasal septum   . GERD (gastroesophageal reflux disease)   . Hyperlipidemia   . Hypertension    CONTROLLED ON MEDS  . Motion sickness    CAR, AMUSEMENT PARK  . Nasal turbinate hypertrophy    NASAL OBSTRUCTION  . Obesity   . Shortness of breath dyspnea    ON EXERTION  . Sinusitis, chronic     Past Surgical History:  Procedure Laterality Date  . CESAREAN SECTION  2002  . COLONOSCOPY    . FRONTAL SINUS EXPLORATION Bilateral 10/08/2015   Procedure: FRONTAL SINUS  EXPLORATION BILATERAL;  Surgeon: Vernie MurdersPaul Juengel, MD;  [facility];  Service: ENT;  Laterality: Bilateral;  . IMAGE GUIDED SINUS SURGERY N/A 10/08/2015   Procedure: IMAGE GUIDED SINUS SURGERY;  Surgeon: Vernie MurdersPaul Juengel, MD;  [facility];  Service: ENT;  Laterality: N/A;  GAVE DISK TO CE[facility] 5/24 DEE UPREG  . MAXILLARY ANTROSTOMY Bilateral 10/08/2015   Procedure: MAXILLARY ANTROSTOMY BILATERAL;  Surgeon: Vernie MurdersPaul Juengel, MD;  [facility];  Service: ENT;  Laterality: Bilateral;  . SEPTOPLASTY N/A 10/08/2015   Procedure: SEPTOPLASTY;  Surgeon: Vernie MurdersPaul Juengel, MD;  [facility];  Service: ENT;  Laterality: N/A;  . TURBINATE REDUCTION Bilateral 10/08/2015   Procedure: TURBINATE REDUCTION BILATERAL;  Surgeon: Vernie MurdersPaul Juengel, MD;  [facility];  Service: ENT;  Laterality: Bilateral;  . UPPER GASTROINTESTINAL ENDOSCOPY  03-18-2014  . WISDOM TOOTH EXTRACTION      Current Outpatient Prescriptions  Medication Sig Dispense Refill  . calcium carbonate (TUMS - DOSED IN MG ELEMENTAL CALCIUM) 500 MG chewable tablet Chew 1 tablet by mouth as needed for indigestion or heartburn.    Marland Kitchen. lisinopril-hydrochlorothiazide (PRINZIDE,ZESTORETIC) 20-12.5 MG tablet TAKE 1 TABLET BY MOUTH DAILY. 30 tablet 0  . LO LOESTRIN FE 1 MG-10 MCG / 10 MCG tablet Take 1 tablet by mouth daily.   3  . omeprazole (PRILOSEC) 40 MG capsule Take  1 capsule (40 mg total) by mouth daily. 30 capsule 3  . OVER THE COUNTER MEDICATION ESSENTIAL OILS    . Probiotic Product (PROBIOTIC PO) Take 1 capsule by mouth daily.    . simethicone (GAS-X) 80 MG chewable tablet Chew 80 mg by mouth as needed.     . triamcinolone (NASACORT ALLERGY 24HR) 55 MCG/ACT AERO nasal inhaler Place 2 sprays into the nose daily. PM     No current facility-administered medications for this visit.     Allergies as of 01/20/2016 - Review Complete 01/20/2016  Allergen Reaction Noted  . Codeine Nausea And  Vomiting 01/04/2011    Family History  Problem Relation Age of Onset  . Depression Mother   . Mental illness Mother   . Hypertension Mother   . Arthritis Father   . Thyroid disease Father   . Mental illness Sister   . Breast cancer Maternal Grandmother   . Diabetes Paternal Grandmother   . Colon cancer Neg Hx   . Stomach cancer Neg Hx     Social History   Social History  . Marital status: Married    Spouse name: N/A  . Number of children: 2  . Years of education: N/A   Occupational History  . 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 Smoking status: Never Smoker  . Smokeless tobacco: Never Used  . Alcohol use 0.0 oz/week     Comment: rare  . Drug use: No  . Sexual activity: Yes    Partners: Male   Other Topics Concern  . Not on file   Social History Narrative   Regular exercise-yes occasionally   Diet: 3 meals daily, varied, no ff, social etoh,     Physical Exam: BP 108/78   Pulse 100   Ht 5' 5.35" (1.66 m) Comment: w/o shoes Constitutional: generally well-appearing Psychiatric: alert and oriented x3 Abdomen: soft, nontender, nondistended, no obvious ascites, no peritoneal signs, normal bowel sounds No peripheral edema noted in lower extremities  Assessment and plan: 45 y.o. female with Dyspepsia, bloating, minor rectal bleeding  Her upper GI symptoms are very clearly improved with proton pump inhibitor. She has no alarm symptoms I recommended she continue on proton pump inhibitor once daily shortly before her breakfast meal. She had upper endoscopy little less than 2 years ago it had mild gastritis that was H. pylori negative. That does not need to be repeated now. She has very minor rectal bleeding 4 times in about a year and a half. She describes dripping into the toilet. This is all certainly minor anorectal disease likely internal or external hemorrhoids. She knows to call here if it worsens. Check colonoscopy a year  and a half ago and I do not think that needs to be repeated at this time.   Rob Bunting, MD [facility] 01/20/2016, 3:32 PM

## 2016-02-01 ENCOUNTER — Encounter: Payer: Self-pay | Admitting: Family Medicine

## 2016-02-01 ENCOUNTER — Ambulatory Visit (INDEPENDENT_AMBULATORY_CARE_PROVIDER_SITE_OTHER): Payer: BC Managed Care – PPO | Admitting: Family Medicine

## 2016-02-01 VITALS — BP 110/76 | HR 82 | Temp 97.8°F | Wt 224.2 lb

## 2016-02-01 DIAGNOSIS — M545 Low back pain, unspecified: Secondary | ICD-10-CM

## 2016-02-01 DIAGNOSIS — R1013 Epigastric pain: Secondary | ICD-10-CM

## 2016-02-01 LAB — POC URINALSYSI DIPSTICK (AUTOMATED)
Bilirubin, UA: NEGATIVE
GLUCOSE UA: NEGATIVE
Ketones, UA: NEGATIVE
Leukocytes, UA: NEGATIVE
Nitrite, UA: NEGATIVE
PH UA: 6
PROTEIN UA: NEGATIVE
RBC UA: NEGATIVE
UROBILINOGEN UA: 0.2

## 2016-02-01 NOTE — Progress Notes (Signed)
Subjective:   Patient ID: Jane Li, female    DOB: 1970/10/28, 45 y.o.   MRN: [id number]  Jane Li is a pleasant 45 y.o. year old female new to me, pt of Dr. Ermalene Searing,  who presents to clinic today with Abdominal Pain (started 4 days ago, headache and diarrhea)  on 02/01/2016  HPI:  H/o GERD.  Saw Dr. Ermalene Searing for this on 10/06/15- note reviewed. Prilosec increased to 40 mg daily, discussed GERD friendly diet, referred back to GI. Neg Lipase  Saw Dr. Christella Hartigan on 01/20/16.  Note reviewed.  He felt that since her symptoms were better with higher dose PPI and she has had extensive work up in past- neg H pylori in past, neg endoscopy less than 2 years ago, colonoscopy less than 2 years ago, that she did not need further work up.  She is here today because 4 days ago, worsening epigastric and lower abdominal pain, associated with headache and diarrhea.  Started with a runny nose, cough, congestion and now has had worsening epigastric pain, fatigue and diarrhea.   Lab Results  Component Value Date   ALT 21 10/06/2015   AST 15 10/06/2015   ALKPHOS 52 10/06/2015   BILITOT 0.3 10/06/2015   Lab Results  Component Value Date   LIPASE 13.0 10/06/2015      Current Outpatient Prescriptions on File Prior to Visit  Medication Sig Dispense Refill  . calcium carbonate (TUMS - DOSED IN MG ELEMENTAL CALCIUM) 500 MG chewable tablet Chew 1 tablet by mouth as needed for indigestion or heartburn.    Marland Kitchen lisinopril-hydrochlorothiazide (PRINZIDE,ZESTORETIC) 20-12.5 MG tablet TAKE 1 TABLET BY MOUTH DAILY. 30 tablet 0  . LO LOESTRIN FE 1 MG-10 MCG / 10 MCG tablet Take 1 tablet by mouth daily.   3  . omeprazole (PRILOSEC) 40 MG capsule Take 1 capsule (40 mg total) by mouth daily. 30 capsule 3  . OVER THE COUNTER MEDICATION ESSENTIAL OILS    . Probiotic Product (PROBIOTIC PO) Take 1 capsule by mouth daily.    . simethicone (GAS-X) 80 MG chewable tablet Chew 80 mg by mouth as needed.     .  triamcinolone (NASACORT ALLERGY 24HR) 55 MCG/ACT AERO nasal inhaler Place 2 sprays into the nose daily. PM     No current facility-administered medications on file prior to visit.     Allergies  Allergen Reactions  . Codeine Nausea And Vomiting    Past Medical History:  Diagnosis Date  . Allergy   . Chronic headaches    2/WEEK SINUS AND STRESS HEADACHES  . Deviated nasal septum   . GERD (gastroesophageal reflux disease)   . Hyperlipidemia   . Hypertension    CONTROLLED ON MEDS  . Motion sickness    CAR, AMUSEMENT PARK  . Nasal turbinate hypertrophy    NASAL OBSTRUCTION  . Obesity   . Shortness of breath dyspnea    ON EXERTION  . Sinusitis, chronic     Past Surgical History:  Procedure Laterality Date  . CESAREAN SECTION  2002  . COLONOSCOPY    . FRONTAL SINUS EXPLORATION Bilateral 10/08/2015   Procedure: FRONTAL SINUS EXPLORATION BILATERAL;  Surgeon: Vernie Murders, MD;  [facility];  Service: ENT;  Laterality: Bilateral;  . IMAGE GUIDED SINUS SURGERY N/A 10/08/2015   Procedure: IMAGE GUIDED SINUS SURGERY;  Surgeon: Vernie Murders, MD;  [facility];  Service: ENT;  Laterality: N/A;  GAVE DISK TO CE[facility] 5/24 DEE UPREG  .  MAXILLARY ANTROSTOMY Bilateral 10/08/2015   Procedure: MAXILLARY ANTROSTOMY BILATERAL;  Surgeon: Vernie MurdersPaul Juengel, MD;  [facility];  Service: ENT;  Laterality: Bilateral;  . SEPTOPLASTY N/A 10/08/2015   Procedure: SEPTOPLASTY;  Surgeon: Vernie MurdersPaul Juengel, MD;  [facility];  Service: ENT;  Laterality: N/A;  . TURBINATE REDUCTION Bilateral 10/08/2015   Procedure: TURBINATE REDUCTION BILATERAL;  Surgeon: Vernie MurdersPaul Juengel, MD;  [facility];  Service: ENT;  Laterality: Bilateral;  . UPPER GASTROINTESTINAL ENDOSCOPY  03-18-2014  . WISDOM TOOTH EXTRACTION      Family History  Problem Relation Age of Onset  . Depression Mother   . Mental illness Mother   . Hypertension Mother   . Arthritis  Father   . Thyroid disease Father   . Mental illness Sister   . Breast cancer Maternal Grandmother   . Diabetes Paternal Grandmother   . Colon cancer Neg Hx   . Stomach cancer Neg Hx     Social History   Social History  . Marital status: Married    Spouse name: N/A  . Number of children: 2  . Years of education: N/A   Occupational History  . 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 Smoking status: Never Smoker  . Smokeless tobacco: Never Used  . Alcohol use 0.0 oz/week     Comment: rare  . Drug use: No  . Sexual activity: Yes    Partners: Male   Other Topics Concern  . Not on file   Social History Narrative   Regular exercise-yes occasionally   Diet: 3 meals daily, varied, no ff, social etoh,   The PMH, PSH, Social History, Family History, Medications, and allergies [facility], and have been updated if relevant.   Review of Systems  Constitutional: Positive for fatigue. Negative for fever and unexpected weight change.  Gastrointestinal: Positive for abdominal pain, diarrhea and nausea. Negative for anal bleeding, blood in stool and vomiting.  Genitourinary: Negative.   Musculoskeletal: Positive for back pain.  Neurological: Positive for headaches. Negative for dizziness, tremors, seizures, syncope, facial asymmetry, speech difficulty, weakness, light-headedness and numbness.  All other systems reviewed and are negative.      Objective:    BP 110/76   Pulse 82   Temp 97.8 F (36.6 C) (Oral)   Wt 224 lb 4 oz (101.7 kg)   BMI 36.91 kg/m   Wt Readings from Last 3 Encounters:  02/01/16 224 lb 4 oz (101.7 kg)  10/08/15 224 lb (101.6 kg)  10/06/15 225 lb 8 oz (102.3 kg)    Physical Exam  Constitutional: She is oriented to person, place, and time. She appears well-developed and well-nourished. No distress.  HENT:  Head: Normocephalic.  Eyes: Conjunctivae are normal.  Abdominal: Soft. She exhibits no  distension and no mass. There is no tenderness. There is no rebound and no guarding.  Musculoskeletal: Normal range of motion.  Neurological: She is alert and oriented to person, place, and time. No cranial nerve deficit.  Skin: Skin is warm and dry. She is not diaphoretic.  Psychiatric: She has a normal mood and affect. Her behavior is normal. Judgment and thought content normal.  Nursing note and vitals reviewed.         Assessment & Plan:   Abdominal pain, epigastric - Plan: H. pylori antibody, IgG, CBC, Lipase  Acute low back pain without sciatica, unspecified back pain laterality - Plan: POCT Urinalysis Dipstick (Automated) No Follow-up on  file.

## 2016-02-01 NOTE — Assessment & Plan Note (Signed)
With fatigue, recent URI symptoms and diarrhea in the setting of chronic GERD. Check for H pylori, pancreatitis today although likely this is a viral syndrome. Advised to push fluids. Exam reassuring, recently seen again by GI. Call or return to clinic prn if these symptoms worsen or fail to improve as anticipated. The patient indicates understanding of these issues and agrees with the plan.

## 2016-02-01 NOTE — Progress Notes (Signed)
Pre visit review using our clinic review tool, if applicable. No additional management support is needed unless otherwise documented below in the visit note. 

## 2016-02-02 LAB — CBC
HEMATOCRIT: 42.4 % (ref 36.0–46.0)
Hemoglobin: 14.1 g/dL (ref 12.0–15.0)
MCHC: 33.3 g/dL (ref 30.0–36.0)
MCV: 92.4 fl (ref 78.0–100.0)
PLATELETS: 294 10*3/uL (ref 150.0–400.0)
RBC: 4.59 Mil/uL (ref 3.87–5.11)
RDW: 14.1 % (ref 11.5–15.5)
WBC: 9.8 10*3/uL (ref 4.0–10.5)

## 2016-02-02 LAB — H. PYLORI ANTIBODY, IGG: H Pylori IgG: NEGATIVE

## 2016-02-02 LAB — LIPASE: LIPASE: 12 U/L (ref 11.0–59.0)

## 2016-02-07 ENCOUNTER — Other Ambulatory Visit: Payer: Self-pay | Admitting: Family Medicine

## 2016-02-19 ENCOUNTER — Ambulatory Visit (INDEPENDENT_AMBULATORY_CARE_PROVIDER_SITE_OTHER): Payer: BC Managed Care – PPO | Admitting: Family Medicine

## 2016-02-19 ENCOUNTER — Ambulatory Visit: Payer: BC Managed Care – PPO | Admitting: Family Medicine

## 2016-02-19 ENCOUNTER — Encounter: Payer: Self-pay | Admitting: Family Medicine

## 2016-02-19 VITALS — BP 102/70 | HR 88 | Temp 98.8°F | Ht 65.5 in | Wt 226.8 lb

## 2016-02-19 DIAGNOSIS — R0683 Snoring: Secondary | ICD-10-CM

## 2016-02-19 DIAGNOSIS — F411 Generalized anxiety disorder: Secondary | ICD-10-CM | POA: Insufficient documentation

## 2016-02-19 DIAGNOSIS — R5383 Other fatigue: Secondary | ICD-10-CM

## 2016-02-19 DIAGNOSIS — G43009 Migraine without aura, not intractable, without status migrainosus: Secondary | ICD-10-CM

## 2016-02-19 DIAGNOSIS — R1013 Epigastric pain: Secondary | ICD-10-CM

## 2016-02-19 MED ORDER — VENLAFAXINE HCL ER 37.5 MG PO CP24: 37.5000 mg | ORAL_CAPSULE | Freq: Every day | ORAL | 2 refills | Status: DC

## 2016-02-19 NOTE — Progress Notes (Signed)
Pre visit review using our clinic review tool, if applicable. No additional management support is needed unless otherwise documented below in the visit note. 

## 2016-02-19 NOTE — Progress Notes (Signed)
Subjective:    Patient ID: Jane Li, female    DOB: 04/21/1970, 45 y.o.   MRN: [id number]  HPI  45 year old female present with multiple complaints .  frequent urination, not emptying  trouble losing weight  leg discoloration Waves of wooziness, feels like may faint.. Followed by flushing x 4 times  extreme fatigue  headache  eye ball pain  neck pain  bloating  nausea   Joint pain  very low libido  pain in upper abd after eating..  Improved some on higher dose of prilosec.. Followed by Dr. Larae GroomsJacob's, H pylori negative, cbc nml , lipase nml, nml UA.   She has been feeling " blah" for 4-5 months.  She is tired overall.   Chronic fatigue worse overall.  Snores at night,  Wakes up tired in AM. No apneic spells.  Denies depression. No anhedonia No anxiety but spells of flushing etc... Seems to happen more when under. stress   Since sinus surgery in 09/2015 she has had daily headache. Pain behind eye balls in sinuses.  feeling like migraine.. Was on topiramate in past.  Saw ENT .Marland Kitchen. Cleared.  Using mucinex D... Feel that helps her headache. Using daily in last week.  On nasocort daily.   Wt Readings from Last 3 Encounters:  02/19/16 226 lb 12 oz (102.9 kg)  02/01/16 224 lb 4 oz (101.7 kg)  10/08/15 224 lb (101.6 kg)   BP Readings from Last 3 Encounters:  02/19/16 102/70  02/01/16 110/76  01/20/16 108/78           Review of Systems  All other systems reviewed and are negative.      Objective:   Physical Exam  Constitutional: Vital signs are normal. She appears well-developed and well-nourished. She is cooperative.  Non-toxic appearance. She does not appear ill. No distress.  obese  HENT:  Head: Normocephalic.  Right Ear: Hearing, tympanic membrane, external ear and ear canal normal. Tympanic membrane is not erythematous, not retracted and not bulging.  Left Ear: Hearing, tympanic membrane, external ear and ear canal normal. Tympanic membrane is not  erythematous, not retracted and not bulging.  Nose: No mucosal edema or rhinorrhea. Right sinus exhibits no maxillary sinus tenderness and no frontal sinus tenderness. Left sinus exhibits no maxillary sinus tenderness and no frontal sinus tenderness.  Mouth/Throat: Uvula is midline, oropharynx is clear and moist and mucous membranes are normal.  Eyes: Conjunctivae, EOM and lids are normal. Pupils are equal, round, and reactive to light. Lids are everted and swept, no foreign bodies found.  Neck: Trachea normal and normal range of motion. Neck supple. Carotid bruit is not present. No thyroid mass and no thyromegaly present.  Cardiovascular: Normal rate, regular rhythm, S1 normal, S2 normal, normal heart sounds, intact distal pulses and normal pulses.  Exam reveals no gallop and no friction rub.   No murmur heard. Pulmonary/Chest: Effort normal and breath sounds normal. No tachypnea. No respiratory distress. She has no decreased breath sounds. She has no wheezes. She has no rhonchi. She has no rales.  Abdominal: Soft. Normal appearance and bowel sounds are normal. There is no tenderness.  Neurological: She is alert.  Skin: Skin is warm, dry and intact. No rash noted.  Psychiatric: Her speech is normal and behavior is normal. Judgment and thought content normal. Her mood appears anxious. Cognition and memory are normal. She does not exhibit a depressed mood.          Assessment & Plan:

## 2016-02-19 NOTE — Patient Instructions (Addendum)
Stop at front desk to set up referral to sleep specialist for sleep apnea.  Start venlafaxine at night for anxiety.  Can try a trial off the birth control.

## 2016-02-22 ENCOUNTER — Other Ambulatory Visit: Payer: Self-pay | Admitting: *Deleted

## 2016-02-22 MED ORDER — LISINOPRIL-HYDROCHLOROTHIAZIDE 20-12.5 MG PO TABS: 1.0000 | ORAL_TABLET | Freq: Every day | ORAL | 0 refills | Status: DC

## 2016-02-23 ENCOUNTER — Ambulatory Visit (INDEPENDENT_AMBULATORY_CARE_PROVIDER_SITE_OTHER): Payer: BC Managed Care – PPO | Admitting: Internal Medicine

## 2016-02-23 ENCOUNTER — Encounter: Payer: Self-pay | Admitting: Internal Medicine

## 2016-02-23 VITALS — BP 124/70 | HR 94 | Ht 65.5 in | Wt 227.0 lb

## 2016-02-23 DIAGNOSIS — G4719 Other hypersomnia: Secondary | ICD-10-CM | POA: Diagnosis not present

## 2016-02-23 DIAGNOSIS — O1002 Pre-existing essential hypertension complicating childbirth: Secondary | ICD-10-CM

## 2016-02-23 NOTE — Progress Notes (Signed)
[facility]      Assessment and Plan:  Excessive daytime sleepiness. -Symptoms and signs of obstructive sleep apnea. -We'll send for sleep study.  Essential hypertension. -Currently medication controlled, sleep apnea can contribute to elevated blood pressure, therefore, is important to treat sleep apnea if present.  Date: 02/23/2016  MRN# [id number] Jane Li 11/27/1970   Jane Li is a 45 y.o. old female seen in consultation for chief complaint of:    Chief Complaint  Patient presents with  . sleep consult    Per Amy Bedsole. no prior sleep study. pt c/o loud snoring X 2y & increased daytime sleepiness x 43mo EPWORTH: 15    HPI:   [name] presents with complaints of snoring, excessive daytime sleepiness. She typically goes to bed between 9:30 and 10:30, takes her about 20 minutes to fall asleep. She wakes up about twice per night. She usually gets out of bed at 5 AM, she feels tired in the morning and not well rested. 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 She has sinus surgery this past summer but noticed that she has been feeling very sleepy and tired since then.   Her husband notes that her snoring is very loud. Her weight has not changed much in the past 5 years. She has a sister with OSA and is on CPAP.    PMHX:   Past Medical History:  Diagnosis Date  . Allergy   . Chronic headaches    2/WEEK SINUS AND STRESS HEADACHES  . Deviated nasal septum   . GERD (gastroesophageal reflux disease)   . Hyperlipidemia   . Hypertension    CONTROLLED ON MEDS  . Motion sickness    [place] [place]  . Nasal turbinate hypertrophy    NASAL OBSTRUCTION  . Obesity   . Shortness of breath dyspnea    ON EXERTION  . Sinusitis, chronic    Surgical Hx:  Past Surgical History:  Procedure Laterality Date  . CESAREAN SECTION  2002  . COLONOSCOPY    . FRONTAL SINUS EXPLORATION Bilateral 10/08/2015   Procedure:  FRONTAL SINUS EXPLORATION BILATERAL;  Surgeon: Vernie MurdersPaul Juengel, MD;  [facility];  Service: ENT;  Laterality: Bilateral;  . IMAGE GUIDED SINUS SURGERY N/A 10/08/2015   Procedure: IMAGE GUIDED SINUS SURGERY;  Surgeon: Vernie MurdersPaul Juengel, MD;  [facility];  Service: ENT;  Laterality: N/A;  GAVE DISK TO [facility] 5/24 DEE UPREG  . MAXILLARY ANTROSTOMY Bilateral 10/08/2015   Procedure: MAXILLARY ANTROSTOMY BILATERAL;  Surgeon: Vernie MurdersPaul Juengel, MD;  [facility];  Service: ENT;  Laterality: Bilateral;  . SEPTOPLASTY N/A 10/08/2015   Procedure: SEPTOPLASTY;  Surgeon: Vernie MurdersPaul Juengel, MD;  [facility];  Service: ENT;  Laterality: N/A;  . TURBINATE REDUCTION Bilateral 10/08/2015   Procedure: TURBINATE REDUCTION BILATERAL;  Surgeon: Vernie MurdersPaul Juengel, MD;  [facility];  Service: ENT;  Laterality: Bilateral;  . UPPER GASTROINTESTINAL ENDOSCOPY  03-18-2014  . WISDOM TOOTH EXTRACTION     Family Hx:  Family History  Problem Relation Age of Onset  . Depression Mother   . Mental illness Mother   . Hypertension Mother   . Arthritis Father   . Thyroid disease Father   . Mental illness Sister   . Breast cancer Maternal Grandmother   . Diabetes Paternal Grandmother   . Colon cancer Neg Hx   . Stomach cancer Neg Hx    Social Hx:   Social History  Substance Use Topics  . Smoking status: Never Smoker  . Smokeless tobacco: Never Used  . Alcohol use 0.0 oz/week     Comment: rare   Medication:   Reviewed    Allergies:  Codeine  Review of Systems: Gen:  Denies  fever, sweats, chills HEENT: Denies blurred vision, double vision. bleeds, sore throat Cvc:  No dizziness, chest pain. Resp:   Denies cough or sputum production, shortness of breath Gi: Denies swallowing difficulty, stomach pain. Gu:  Denies bladder incontinence, burning urine Ext:   No Joint pain, stiffness. Skin: No skin rash,  hives  Endoc:  No polyuria, polydipsia. Psych:  No depression, insomnia. Other:  All other systems were reviewed with the patient and were negative other that what is mentioned in the HPI.   Physical Examination:   VS: BP 124/70 (BP Location: Left Arm, Cuff Size: Normal)   Pulse 94   Ht 5' 5.5" (1.664 m)   Wt 227 lb (103 kg)   SpO2 96%   BMI 37.20 kg/m   General Appearance: No distress  Neuro:without focal findings,  speech normal,  HEENT: PERRLA, EOM intact.   Pulmonary: normal breath sounds, No wheezing.  CardiovascularNormal S1,S2.  No m/r/g.   Abdomen: Benign, Soft, non-tender. Renal:  No costovertebral tenderness  GU:  No performed at this time. Endoc: No evident thyromegaly, no signs of acromegaly. Skin:   warm, no rashes, no ecchymosis  Extremities: normal, no cyanosis, clubbing.  Other findings:    LABORATORY PANEL:   CBC No results for input(s): WBC, HGB, HCT, PLT in the last 168 hours. ------------------------------------------------------------------------------------------------------------------  Chemistries  No results for input(s): NA, K, CL, CO2, GLUCOSE, BUN, CREATININE, CALCIUM, MG, AST, ALT, ALKPHOS, BILITOT in the last 168 hours.  Invalid input(s): GFRCGP ------------------------------------------------------------------------------------------------------------------  Cardiac Enzymes No results for input(s): TROPONINI in the last 168 hours. ------------------------------------------------------------  RADIOLOGY:  No results found.     Thank  you for the consultation and for allowing [facility] to assist in the care of your patient. Our recommendations are noted above.  Please contact us if we can be of further service.   Wells Guileseep Turquoise Esch, MD.  Board Certified in Internal Medicine, Pulmonary Medicine, Critical Care Medicine, and Sleep Medicine.  [place] [facility] Office Number: [phone]  Santiago Gladavid Kasa, M.D.  Stephanie AcreVishal Mungal, M.D.  Billy Fischeravid  Simonds, M.D  02/23/2016

## 2016-02-23 NOTE — Patient Instructions (Signed)

## 2016-03-17 ENCOUNTER — Ambulatory Visit: Payer: BC Managed Care – PPO | Attending: Internal Medicine

## 2016-03-17 DIAGNOSIS — R5383 Other fatigue: Secondary | ICD-10-CM | POA: Insufficient documentation

## 2016-03-17 DIAGNOSIS — G4719 Other hypersomnia: Secondary | ICD-10-CM

## 2016-03-22 ENCOUNTER — Ambulatory Visit (INDEPENDENT_AMBULATORY_CARE_PROVIDER_SITE_OTHER): Payer: BC Managed Care – PPO | Admitting: Family Medicine

## 2016-03-22 ENCOUNTER — Encounter: Payer: Self-pay | Admitting: Family Medicine

## 2016-03-22 DIAGNOSIS — F411 Generalized anxiety disorder: Secondary | ICD-10-CM | POA: Diagnosis not present

## 2016-03-22 DIAGNOSIS — Z6837 Body mass index (BMI) 37.0-37.9, adult: Secondary | ICD-10-CM

## 2016-03-22 DIAGNOSIS — R1013 Epigastric pain: Secondary | ICD-10-CM

## 2016-03-22 DIAGNOSIS — R5383 Other fatigue: Secondary | ICD-10-CM | POA: Diagnosis not present

## 2016-03-22 DIAGNOSIS — G43009 Migraine without aura, not intractable, without status migrainosus: Secondary | ICD-10-CM

## 2016-03-22 NOTE — Assessment & Plan Note (Addendum)
Start effexor to treat as a lot of symtpoms may be stemming from moderate control of mood.

## 2016-03-22 NOTE — Patient Instructions (Signed)
Follow up as needed if symptoms continued.

## 2016-03-22 NOTE — Assessment & Plan Note (Signed)
Improved with higher dose of PPI.

## 2016-03-22 NOTE — Assessment & Plan Note (Signed)
Await sleep study 

## 2016-03-22 NOTE — Assessment & Plan Note (Signed)
Trigger may be poor sleep and possible sleep apnea.. Refer for eval.

## 2016-03-22 NOTE — Assessment & Plan Note (Signed)
Improved control, mild on venalfaxine.  No further flushing spells.

## 2016-03-22 NOTE — Assessment & Plan Note (Signed)
Encouraged exercise, weight loss, healthy eating habits.  Unfortunately no SE of weight loss with venlafaxine.

## 2016-03-22 NOTE — Progress Notes (Signed)
Pre visit review using our clinic review tool, if applicable. No additional management support is needed unless otherwise documented below in the visit note. 

## 2016-03-22 NOTE — Assessment & Plan Note (Signed)
mAy be triggered by sleep apnea or OCP.  Will await slee study to eval and she will try to decrease /stop OCPs following upcoming uterine surgery.

## 2016-03-22 NOTE — Progress Notes (Signed)
   Subjective:    Patient ID: Jane Li, female    DOB: 11/09/1970, 45 y.o.   MRN: [id number]  HPI   45 year old female presents for follow up on migraines and anxiety.   At last OV she was started on  Venlafaxine 75 mg daily. She has had some improvement in anxiety. No flushing with stress.   She has not yet stopped birth control.. She has appt for uterine ablation this week.  She has had 3-4 headaches weekly in last 4 weeks.  Using mucinex to break up musuc.. Some sore throat today. BP Readings from Last 3 Encounters:  03/22/16 120/82  02/23/16 124/70  02/19/16 102/70     Referred to sleep specialist to eval for sleep apnea. No results back yet.  Still with daily fatigue  Abdominal bloating, nausea and abd pain better with higher dose of prilosec.  Wt Readings from Last 3 Encounters:  03/22/16 226 lb (102.5 kg)  02/23/16 227 lb (103 kg)  02/19/16 226 lb 12 oz (102.9 kg)    Review of Systems  Constitutional: Negative for fatigue and fever.  HENT: Negative for ear pain.   Eyes: Negative for pain.  Respiratory: Negative for chest tightness and shortness of breath.   Cardiovascular: Negative for chest pain, palpitations and leg swelling.  Gastrointestinal: Negative for abdominal pain.  Genitourinary: Negative for dysuria.       Objective:   Physical Exam  Constitutional: Vital signs are normal. She appears well-developed and well-nourished. She is cooperative.  Non-toxic appearance. She does not appear ill. No distress.  HENT:  Head: Normocephalic.  Right Ear: Hearing, tympanic membrane, external ear and ear canal normal. Tympanic membrane is not erythematous, not retracted and not bulging.  Left Ear: Hearing, tympanic membrane, external ear and ear canal normal. Tympanic membrane is not erythematous, not retracted and not bulging.  Nose: No mucosal edema or rhinorrhea. Right sinus exhibits no maxillary sinus tenderness and no frontal sinus tenderness. Left  sinus exhibits no maxillary sinus tenderness and no frontal sinus tenderness.  Mouth/Throat: Uvula is midline, oropharynx is clear and moist and mucous membranes are normal.  Eyes: Conjunctivae, EOM and lids are normal. Pupils are equal, round, and reactive to light. Lids are everted and swept, no foreign bodies found.  Neck: Trachea normal and normal range of motion. Neck supple. Carotid bruit is not present. No thyroid mass and no thyromegaly present.  Cardiovascular: Normal rate, regular rhythm, S1 normal, S2 normal, normal heart sounds, intact distal pulses and normal pulses.  Exam reveals no gallop and no friction rub.   No murmur heard. Pulmonary/Chest: Effort normal and breath sounds normal. No tachypnea. No respiratory distress. She has no decreased breath sounds. She has no wheezes. She has no rhonchi. She has no rales.  Abdominal: Soft. Normal appearance and bowel sounds are normal. There is no tenderness.  Neurological: She is alert.  Skin: Skin is warm, dry and intact. No rash noted.  Psychiatric: Her speech is normal and behavior is normal. Judgment and thought content normal. Her mood appears not anxious. Cognition and memory are normal. She does not exhibit a depressed mood.    Body mass index is 37.04 kg/m.      Assessment & Plan:

## 2016-03-22 NOTE — Assessment & Plan Note (Signed)
Improved with higher dose prilosec.

## 2016-03-22 NOTE — Assessment & Plan Note (Signed)
Eval with sleep study for sleep apnea.

## 2016-03-24 DIAGNOSIS — G4710 Hypersomnia, unspecified: Secondary | ICD-10-CM | POA: Diagnosis not present

## 2016-03-25 ENCOUNTER — Telehealth: Payer: Self-pay

## 2016-03-25 NOTE — Telephone Encounter (Signed)
Pt aware of results and voiced her understanding. Nothing further needed.  

## 2016-03-25 NOTE — Telephone Encounter (Signed)
-----   Message from Shane CrutchPradeep Ramachandran, MD sent at 03/24/2016  4:06 PM EST ----- Regarding: sleep study.  Sleep study negative for sleep apnea. The patient can follow up as needed.

## 2016-05-04 ENCOUNTER — Telehealth: Payer: Self-pay | Admitting: Family Medicine

## 2016-05-04 NOTE — Telephone Encounter (Signed)
TELEPHONE ADVICE RECORD [facility]  Patient Name: Jane Li  DOB: 09/03/1970    Initial Comment Caller states that she has had some weird vision problems for the past three days. IT comes and goes, but today she was so dizzy that she almost fell down. She has has been feeling dizzy and light headed. If she looks left or right she gets dizzy. She also has some neck pain and it has been like that for 3 days. The vision is like wavy and moving like the TV says patient.    Nurse Assessment  Nurse: Odis LusterBowers, RN, Bjorn Loserhonda Date/Time (Eastern Time): 05/04/2016 3:53:28 PM  Confirm and document reason for call. If symptomatic, describe symptoms. ---Caller states that she has had some weird vision problems for the past three days. IT comes and goes, but today she was so dizzy that she almost fell down. She has has been feeling dizzy and light headed. If she looks left or right she gets dizzy. She also has some neck pain and it has been like that for 3 days. The vision is like wavy and moving like the TV says patient. No injury to the neck, just achiness and soreness. has had neck issues in the past, no surgery. Has had xrays in the past.  Does the patient have any new or worsening symptoms? ---Yes  Will a triage be completed? ---Yes  Related visit to physician within the last 2 weeks? ---No  Does the PT have any chronic conditions? (i.e. diabetes, asthma, etc.) ---Yes  List chronic conditions. ---HTN; acid reflux; anxiety  Is the patient pregnant or possibly pregnant? (Ask all females between the ages of 512-55) ---No  Is this a behavioral health or substance abuse call? ---No     Guidelines    Guideline Title Affirmed Question Affirmed Notes  Dizziness - Lightheadedness [1] MODERATE dizziness (e.g., interferes with normal activities) AND [2] has NOT been evaluated by physician for this (Exception: dizziness caused by heat exposure, sudden standing, or poor fluid intake)    Final  Disposition User   See Physician within 24 Hours Spring HillBowers, RN, Bjorn Loserhonda    Comments  Scheduled caller with Dr. Dayton MartesAron in the morning at 10:45am [facility].   Referrals  REFERRED TO PCP OFFICE   Disagree/Comply: Comply

## 2016-05-04 NOTE — Telephone Encounter (Signed)
Pt has appt 05/05/16 at 10:45 with Dr Dayton MartesAron.

## 2016-05-05 ENCOUNTER — Emergency Department: Payer: BC Managed Care – PPO

## 2016-05-05 ENCOUNTER — Ambulatory Visit: Payer: Self-pay | Admitting: Family Medicine

## 2016-05-05 ENCOUNTER — Emergency Department
Admission: EM | Admit: 2016-05-05 | Discharge: 2016-05-05 | Disposition: A | Discharge status: Home / Self Care | Payer: BC Managed Care – PPO | Attending: Emergency Medicine | Admitting: Emergency Medicine

## 2016-05-05 ENCOUNTER — Telehealth: Payer: Self-pay

## 2016-05-05 ENCOUNTER — Encounter: Payer: Self-pay | Admitting: Emergency Medicine

## 2016-05-05 DIAGNOSIS — R61 Generalized hyperhidrosis: Secondary | ICD-10-CM | POA: Insufficient documentation

## 2016-05-05 DIAGNOSIS — R109 Unspecified abdominal pain: Secondary | ICD-10-CM

## 2016-05-05 DIAGNOSIS — I10 Essential (primary) hypertension: Secondary | ICD-10-CM | POA: Insufficient documentation

## 2016-05-05 DIAGNOSIS — Z79899 Other long term (current) drug therapy: Secondary | ICD-10-CM | POA: Insufficient documentation

## 2016-05-05 DIAGNOSIS — R1012 Left upper quadrant pain: Secondary | ICD-10-CM | POA: Insufficient documentation

## 2016-05-05 DIAGNOSIS — R0602 Shortness of breath: Secondary | ICD-10-CM | POA: Insufficient documentation

## 2016-05-05 DIAGNOSIS — R10A0 Flank pain, unspecified side: Secondary | ICD-10-CM

## 2016-05-05 DIAGNOSIS — R079 Chest pain, unspecified: Secondary | ICD-10-CM

## 2016-05-05 LAB — CBC
HCT: 39.5 % (ref 35.0–47.0)
HEMOGLOBIN: 13.6 g/dL (ref 12.0–16.0)
MCH: 31 pg (ref 26.0–34.0)
MCHC: 34.4 g/dL (ref 32.0–36.0)
MCV: 90.1 fL (ref 80.0–100.0)
PLATELETS: 307 10*3/uL (ref 150–440)
RBC: 4.38 MIL/uL (ref 3.80–5.20)
RDW: 13.9 % (ref 11.5–14.5)
WBC: 9.6 10*3/uL (ref 3.6–11.0)

## 2016-05-05 LAB — BASIC METABOLIC PANEL
ANION GAP: 13 (ref 5–15)
BUN: 14 mg/dL (ref 6–20)
CALCIUM: 8.9 mg/dL (ref 8.9–10.3)
CHLORIDE: 105 mmol/L (ref 101–111)
CO2: 20 mmol/L — ABNORMAL LOW (ref 22–32)
CREATININE: 0.84 mg/dL (ref 0.44–1.00)
GFR calc non Af Amer: >60 mL/min (ref >60)
Glucose, Bld: 119 mg/dL — ABNORMAL HIGH (ref 65–99)
Potassium: 3.7 mmol/L (ref 3.5–5.1)
SODIUM: 138 mmol/L (ref 135–145)

## 2016-05-05 LAB — URINALYSIS, COMPLETE (UACMP) WITH MICROSCOPIC
Bacteria, UA: NONE SEEN
Bilirubin Urine: NEGATIVE
Glucose, UA: NEGATIVE mg/dL
Hgb urine dipstick: NEGATIVE
Ketones, ur: NEGATIVE mg/dL
Leukocytes, UA: NEGATIVE
Nitrite: NEGATIVE
Protein, ur: NEGATIVE mg/dL
Specific Gravity, Urine: >1.046 — ABNORMAL HIGH (ref 1.005–1.030)
pH: 7 (ref 5.0–8.0)

## 2016-05-05 LAB — FIBRIN DERIVATIVES D-DIMER (ARMC ONLY): FIBRIN DERIVATIVES D-DIMER (ARMC): 1910 — AB (ref 0–499)

## 2016-05-05 LAB — POCT PREGNANCY, URINE: PREG TEST UR: NEGATIVE

## 2016-05-05 LAB — TROPONIN I: Troponin I: <0.03 ng/mL (ref <0.03)

## 2016-05-05 LAB — LIPASE, BLOOD: LIPASE: 25 U/L (ref 11–51)

## 2016-05-05 MED ORDER — PROMETHAZINE HCL 25 MG PO TABS: 25.0000 mg | ORAL_TABLET | Freq: Four times a day (QID) | ORAL | 0 refills | Status: DC | PRN

## 2016-05-05 MED ORDER — ONDANSETRON HCL 4 MG/2ML IJ SOLN
INTRAMUSCULAR | Status: AC
  Filled 2016-05-05: qty 2

## 2016-05-05 MED ORDER — ACETAMINOPHEN 325 MG PO TABS
650.0000 mg | ORAL_TABLET | Freq: Once | ORAL | Status: AC
  Administered 2016-05-05: 650 mg via ORAL

## 2016-05-05 MED ORDER — MORPHINE SULFATE (PF) 2 MG/ML IV SOLN
2.0000 mg | Freq: Once | INTRAVENOUS | Status: AC
Start: 2016-05-05 — End: 2016-05-05
  Administered 2016-05-05: 2 mg via INTRAVENOUS

## 2016-05-05 MED ORDER — OXYCODONE-ACETAMINOPHEN 5-325 MG PO TABS: 2.0000 | ORAL_TABLET | Freq: Four times a day (QID) | ORAL | 0 refills | Status: DC | PRN

## 2016-05-05 MED ORDER — MORPHINE SULFATE (PF) 4 MG/ML IV SOLN
INTRAVENOUS | Status: AC
  Administered 2016-05-05: 2 mg
  Filled 2016-05-05: qty 1

## 2016-05-05 MED ORDER — SODIUM CHLORIDE 0.9 % IV SOLN
Freq: Once | INTRAVENOUS | Status: AC
  Administered 2016-05-05: 09:00:00 via INTRAVENOUS

## 2016-05-05 MED ORDER — SUMATRIPTAN SUCCINATE 100 MG PO TABS: ORAL_TABLET | ORAL | 0 refills | Status: DC

## 2016-05-05 MED ORDER — MORPHINE SULFATE (PF) 2 MG/ML IV SOLN
2.0000 mg | Freq: Once | INTRAVENOUS | Status: DC
Start: 2016-05-05 — End: 2016-05-05
  Filled 2016-05-05: qty 1

## 2016-05-05 MED ORDER — ACETAMINOPHEN 325 MG PO TABS
ORAL_TABLET | ORAL | Status: AC
  Administered 2016-05-05: 650 mg via ORAL
  Filled 2016-05-05: qty 2

## 2016-05-05 MED ORDER — ONDANSETRON HCL 4 MG/2ML IJ SOLN
4.0000 mg | Freq: Once | INTRAMUSCULAR | Status: AC
  Administered 2016-05-05: 4 mg via INTRAVENOUS

## 2016-05-05 MED ORDER — ONDANSETRON HCL 4 MG/2ML IJ SOLN
4.0000 mg | Freq: Once | INTRAMUSCULAR | Status: AC
  Administered 2016-05-05: 4 mg via INTRAVENOUS
  Filled 2016-05-05: qty 2

## 2016-05-05 MED ORDER — HYDROMORPHONE HCL 1 MG/ML IJ SOLN
0.5000 mg | Freq: Once | INTRAMUSCULAR | Status: AC
  Administered 2016-05-05: 0.5 mg via INTRAVENOUS
  Filled 2016-05-05: qty 1

## 2016-05-05 MED ORDER — IOPAMIDOL (ISOVUE-370) INJECTION 76%
75.0000 mL | Freq: Once | INTRAVENOUS | Status: AC | PRN
  Administered 2016-05-05: 75 mL via INTRAVENOUS

## 2016-05-05 NOTE — Telephone Encounter (Signed)
Pt left v/m; pt was talking with Lupita LeashDonna about being seen by Dr Ermalene SearingBedsole.pt is going to see Dr Ermalene SearingBedsole on 05/10/16.  Pt said ED sent pt home with percocet for migraine and pt prefers not to take percocet. Pt wants to know if Dr Ermalene SearingBedsole would send med for migraine to [facility].Please advise.

## 2016-05-05 NOTE — ED Triage Notes (Signed)
Pt complains of right chest pain radiating to backsince 430. Pt stated she was sweating and nauseated prior to EMS arrival. EMS gave 324 ASA, 4 mg Morphine, 1 nitro tablet.

## 2016-05-05 NOTE — ED Provider Notes (Signed)
No etiology for the patient's symptoms are found. Repeat troponin is negative, CT of the chest abdomen and pelvis is negative. She is advised to follow-up with her doctor tomorrow for recheck.   Jane FilbertJonathan E Jordie Schreur, MD 05/05/16 [phone]

## 2016-05-05 NOTE — ED Provider Notes (Signed)
[facility] Emergency Department Provider Note   First MD Initiated Contact with Patient 05/05/16 [phone]     (approximate)  I have reviewed the triage vital signs and the nursing notes.   HISTORY  Chief Complaint Chest Pain    HPI Jane Li is a 46 y.o. female with bolus of chronic medical conditions presents to the emergency department with abrupt onset of left-sided chest pain with radiation to her back with onset at 4:30 AM this morning. Patient was diaphoretic on EMS arrival. EMS administered aspirin 324 mg as well as 4 mg of morphine one sublingual nitroglycerin. Patient presents to the emergency department at this time with 10 out of 10 left chest pain radiating to her back. Patient also admits to nausea   Past Medical History:  Diagnosis Date  . Allergy   . Chronic headaches    2/WEEK SINUS AND STRESS HEADACHES  . Deviated nasal septum   . GERD (gastroesophageal reflux disease)   . Hyperlipidemia   . Hypertension    CONTROLLED ON MEDS  . Motion sickness    CAR, AMUSEMENT PARK  . Nasal turbinate hypertrophy    NASAL OBSTRUCTION  . Obesity   . Shortness of breath dyspnea    ON EXERTION  . Sinusitis, chronic     Patient Active Problem List   Diagnosis Date Noted  . GAD (generalized anxiety disorder) 02/19/2016  . Abdominal pain, epigastric 10/06/2015  . Fatigue 12/19/2014  . Decreased libido 12/19/2014  . Sequelae of injury of head 12/19/2014  . Neck pain 09/05/2014  . Helicobacter pylori gastritis 11/12/2013  . High cholesterol 02/01/2013  . Elevated LFTs 06/07/2012  . HTN (hypertension) 04/13/2012  . GERD 07/30/2009  . BMI 37.0-37.9, adult 05/11/2007  . Migraine headache without aura 05/03/2007  . Allergic rhinitis 05/03/2007  . FATIGUE, CHRONIC 08/15/2006    Past Surgical History:  Procedure Laterality Date  . CESAREAN SECTION  2002  . COLONOSCOPY    . FRONTAL SINUS EXPLORATION Bilateral 10/08/2015   Procedure: FRONTAL  SINUS EXPLORATION BILATERAL;  Surgeon: Vernie Murders, MD;  Location: [facility];  Service: ENT;  Laterality: Bilateral;  . IMAGE GUIDED SINUS SURGERY N/A 10/08/2015   Procedure: IMAGE GUIDED SINUS SURGERY;  Surgeon: Vernie Murders, MD;  Location: [facility];  Service: ENT;  Laterality: N/A;  GAVE DISK TO CE[facility] 5/24 DEE UPREG  . MAXILLARY ANTROSTOMY Bilateral 10/08/2015   Procedure: MAXILLARY ANTROSTOMY BILATERAL;  Surgeon: Vernie Murders, MD;  Location: [facility];  Service: ENT;  Laterality: Bilateral;  . SEPTOPLASTY N/A 10/08/2015   Procedure: SEPTOPLASTY;  Surgeon: Vernie Murders, MD;  Location: [facility];  Service: ENT;  Laterality: N/A;  . TURBINATE REDUCTION Bilateral 10/08/2015   Procedure: TURBINATE REDUCTION BILATERAL;  Surgeon: Vernie Murders, MD;  Location: [facility];  Service: ENT;  Laterality: Bilateral;  . UPPER GASTROINTESTINAL ENDOSCOPY  03-18-2014  . WISDOM TOOTH EXTRACTION      Prior to Admission medications   Medication Sig Start Date End Date Taking? Authorizing Provider  calcium carbonate (TUMS - DOSED IN MG ELEMENTAL CALCIUM) 500 MG chewable tablet Chew 1 tablet by mouth as needed for indigestion or heartburn.    Historical Provider, MD  ibuprofen (ADVIL,MOTRIN) 800 MG tablet  03/18/16   Historical Provider, MD  lisinopril-hydrochlorothiazide (PRINZIDE,ZESTORETIC) 20-12.5 MG tablet Take 1 tablet by mouth daily. 02/22/16   Amy E Ermalene Searing, MD  LO LOESTRIN FE 1 MG-10 MCG / 10 MCG tablet Take 1 tablet by  mouth daily.  07/10/14   Historical Provider, MD  omeprazole (PRILOSEC) 40 MG capsule TAKE 1 CAPSULE (40 MG TOTAL) BY MOUTH DAILY. 02/07/16   Amy Michelle Nasuti, MD  OVER THE COUNTER MEDICATION ESSENTIAL OILS    Historical Provider, MD  Probiotic Product (PROBIOTIC PO) Take 1 capsule by mouth daily.    Historical Provider, MD  pseudoephedrine-guaifenesin (MUCINEX D) 60-600 MG 12 hr tablet Take 0.5 tablets by mouth every 12 (twelve) hours.    Historical  Provider, MD  simethicone (GAS-X) 80 MG chewable tablet Chew 80 mg by mouth as needed.     Historical Provider, MD  triamcinolone (NASACORT ALLERGY 24HR) 55 MCG/ACT AERO nasal inhaler Place 2 sprays into the nose daily. PM    Historical Provider, MD  venlafaxine XR (EFFEXOR-XR) 37.5 MG 24 hr capsule Take 1 capsule (37.5 mg total) by mouth daily with breakfast. 1 tab po daily x 1 week, then increase to 2 tabs daily if tolearted 02/19/16   Amy E Ermalene Searing, MD    Allergies Codeine  Family History  Problem Relation Age of Onset  . Depression Mother   . Mental illness Mother   . Hypertension Mother   . Arthritis Father   . Thyroid disease Father   . Mental illness Sister   . Breast cancer Maternal Grandmother   . Diabetes Paternal Grandmother   . Colon cancer Neg Hx   . Stomach cancer Neg Hx     Social History Social History  Substance Use Topics  . Smoking status: Never Smoker  . Smokeless tobacco: Never Used  . Alcohol use 0.0 oz/week     Comment: rare    Review of Systems Constitutional: No fever/chills Eyes: No visual changes. ENT: No sore throat. Cardiovascular: Positive for chest pain Respiratory: Positive for shortness of breath. Gastrointestinal: No abdominal pain.  No nausea, no vomiting.  No diarrhea.  No constipation. Genitourinary: Negative for dysuria. Musculoskeletal: Negative for back pain. Skin: Negative for rash. Neurological: Negative for headaches, focal weakness or numbness.  10-point ROS otherwise negative.  ____________________________________________   PHYSICAL EXAM:  VITAL SIGNS: ED Triage Vitals  Enc Vitals Group     BP 05/05/16 0528 129/90     Pulse Rate 05/05/16 0528 (!) 115     Resp 05/05/16 0528 (!) 23     Temp 05/05/16 0528 98.3 F (36.8 C)     Temp Source 05/05/16 0528 Oral     SpO2 05/05/16 0528 98 %     Weight 05/05/16 0532 225 lb (102.1 kg)     Height 05/05/16 0532 5\' 6"  (1.676 m)     Head Circumference --      Peak Flow --        Pain Score 05/05/16 0532 6     Pain Loc --      Pain Edu? --      Excl. in GC? --     Constitutional: Alert and oriented.Apparent discomfort  Eyes: Conjunctivae are normal. PERRL. EOMI. Head: Atraumatic. Mouth/Throat: Mucous membranes are moist.  Oropharynx non-erythematous. Neck: No stridor.   Cardiovascular: Tachycardia, regular rhythm. Good peripheral circulation. Grossly normal heart sounds. Respiratory: Tachypnea.  No retractions. Lungs CTAB. Gastrointestinal: Left upper quadrant tenderness to palpation. No distention.  Musculoskeletal: No lower extremity tenderness nor edema. No gross deformities of extremities. Neurologic:  Normal speech and language. No gross focal neurologic deficits are appreciated.  Skin:  Skin is warm, dry and intact. No rash noted. Psychiatric: Mood and affect are normal. Speech and  behavior are normal.  ____________________________________________   LABS (all labs ordered are listed, but only abnormal results are displayed)  Labs Reviewed  BASIC METABOLIC PANEL - Abnormal; Notable for the following:       Result Value   CO2 20 (*)    Glucose, Bld 119 (*)    All other components within normal limits  FIBRIN DERIVATIVES D-DIMER ([facility] ONLY) - Abnormal; Notable for the following:    Fibrin derivatives D-dimer ([facility]) 1,910 (*)    All other components within normal limits  CBC  TROPONIN I  LIPASE, BLOOD   ____________________________________________  EKG  ED ECG REPORT I, [place] N Kevionna Heffler, the attending physician, personally viewed and interpreted this ECG.   Date: 05/05/2016  EKG Time: 5:24 AM  Rate: 119  Rhythm: {Sinus tachycardia  Axis: Normal  Intervals: Normal  ST&T Change: None  ____________________________________________  RADIOLOGY I, [place] N Brownie Nehme, personally viewed and evaluated these images (plain radiographs) as part of my medical decision making, as well as reviewing the written report by the radiologist.  Dg Chest  Port 1 View  Result Date: 05/05/2016 CLINICAL DATA:  Initial evaluation for acute right-sided chest pain. EXAM: PORTABLE CHEST 1 VIEW COMPARISON:  Prior radiograph from 02/27/2015. FINDINGS: The cardiac and mediastinal silhouettes are stable in size and contour, and remain within normal limits. The lungs are normally inflated. No airspace consolidation, pleural effusion, or pulmonary edema is identified. There is no pneumothorax. No acute osseous abnormality identified. IMPRESSION: No active cardiopulmonary disease. Electronically Signed   By: Rise MuBenjamin  McClintock M.D.   On: 05/05/2016 05:52      Procedures    INITIAL IMPRESSION / ASSESSMENT AND PLAN / ED COURSE  Pertinent labs & imaging results that were available during my care of the patient were reviewed by me and considered in my medical decision making (see chart for details).  46 year old female presenting to the emergency department with left chest pain dyspnea tachycardia tachypnea. Concern for possible cardiac versus pulmonary emboli etiology for the patient's pain. Patient's d-dimer noted to be elevated at 1910 is a CT angiogram of the chest ordered. Patient's care transferred to Dr. Mayford KnifeWilliams      ____________________________________________  FINAL CLINICAL IMPRESSION(S) / ED DIAGNOSES  Final diagnoses:  Chest pain, unspecified type     MEDICATIONS GIVEN DURING THIS VISIT:  Medications  morphine 2 MG/ML injection 2 mg (2 mg Intravenous Not Given 05/05/16 0531)  ondansetron (ZOFRAN) injection 4 mg (not administered)  morphine 2 MG/ML injection 2 mg (not administered)  morphine 4 MG/ML injection (2 mg  Given 05/05/16 0530)  ondansetron (ZOFRAN) injection 4 mg (4 mg Intravenous Given 05/05/16 0525)  acetaminophen (TYLENOL) tablet 650 mg (650 mg Oral Given 05/05/16 0653)  iopamidol (ISOVUE-370) 76 % injection 75 mL (75 mLs Intravenous Contrast Given 05/05/16 0710)     NEW OUTPATIENT MEDICATIONS STARTED DURING THIS  VISIT:  New Prescriptions   No medications on file    Modified Medications   No medications on file    Discontinued Medications   No medications on file     Note:  This document was prepared using Dragon voice recognition software and may include unintentional dictation errors.    Darci Currentandolph N Kaidence Sant, MD 05/05/16 [phone]

## 2016-05-05 NOTE — Telephone Encounter (Signed)
Will send in rx for sumatriptan. If pt with migraine... Take med with glass of water and  some food and lie down for at least 30 min (or at bedtime) to avoid experiencing side effects ( will sleep through and they should not bother her). When wakes up migraine should be gone. If not may repeat dose in 2 hours.

## 2016-05-05 NOTE — Telephone Encounter (Signed)
I spoke with pt and notified as instructed. Pt voiced understanding. FYI to Dr Ermalene SearingBedsole.

## 2016-05-05 NOTE — ED Notes (Signed)
Pt complains of a headache. MD made aware.

## 2016-05-05 NOTE — ED Notes (Signed)
AAOx3.  Skin warm and dry.  No SOB/ DOE.  NAD 

## 2016-05-10 ENCOUNTER — Encounter: Payer: Self-pay | Admitting: Family Medicine

## 2016-05-10 ENCOUNTER — Ambulatory Visit (INDEPENDENT_AMBULATORY_CARE_PROVIDER_SITE_OTHER): Payer: BC Managed Care – PPO | Admitting: Family Medicine

## 2016-05-10 DIAGNOSIS — F411 Generalized anxiety disorder: Secondary | ICD-10-CM | POA: Diagnosis not present

## 2016-05-10 DIAGNOSIS — R0789 Other chest pain: Secondary | ICD-10-CM

## 2016-05-10 DIAGNOSIS — G43009 Migraine without aura, not intractable, without status migrainosus: Secondary | ICD-10-CM

## 2016-05-10 NOTE — Progress Notes (Signed)
   Subjective:    Patient ID: Jane Li, female    DOB: 01/13/1971, 46 y.o.   MRN: [id number]  HPI   46 year old female presents for hospital follow up.   She was seen on 1/25 in ER for chest pain, dyspnea, tachycardia and tachypnea.  Left sided, radiation to back and left arm. Crampy sharp pain, severe.  Took ASA, called EMS.  Pain was spasmodic.  EKG:  Sinus tachycardia  CXR: neg  Ddimer elevated. Sent for chest CT:   No PE CT abd/renal: negative. Pain went away with morphine  troponin I neg UA clear    In last few days she has had achiness in left upper chest, sore with deep breaths.  Left neck sore  No further shortness of breath   She had had proceeding dizziness and "waviness" of vision at periphery for 5 days prior  She had uterine ablation in 03/2016. Has been able to stop OCPs.in last week. She has not had heavy spotting.  Imitrex helped with migraine.  She has not been taking venlafaxine regularly, forgets.. Thinks it has helped some. 75 mg daily.  She has appt with Dr. Darrold JunkerParaschos... cardiology.   Review of Systems  Constitutional: Positive for fatigue. Negative for fever.  HENT: Negative for ear pain.   Eyes: Negative for pain.  Respiratory: Positive for chest tightness. Negative for shortness of breath.   Cardiovascular: Negative for chest pain, palpitations and leg swelling.  Gastrointestinal: Negative for abdominal pain.  Genitourinary: Negative for dysuria.       Objective:   Physical Exam  Constitutional: Vital signs are normal. She appears well-developed and well-nourished. She is cooperative.  Non-toxic appearance. She does not appear ill. No distress.  HENT:  Head: Normocephalic.  Right Ear: Hearing, tympanic membrane, external ear and ear canal normal. Tympanic membrane is not erythematous, not retracted and not bulging.  Left Ear: Hearing, tympanic membrane, external ear and ear canal normal. Tympanic membrane is not erythematous, not  retracted and not bulging.  Nose: No mucosal edema or rhinorrhea. Right sinus exhibits no maxillary sinus tenderness and no frontal sinus tenderness. Left sinus exhibits no maxillary sinus tenderness and no frontal sinus tenderness.  Mouth/Throat: Uvula is midline, oropharynx is clear and moist and mucous membranes are normal.  Eyes: Conjunctivae, EOM and lids are normal. Pupils are equal, round, and reactive to light. Lids are everted and swept, no foreign bodies found.  Neck: Trachea normal and normal range of motion. Neck supple. Carotid bruit is not present. No thyroid mass and no thyromegaly present.  Cardiovascular: Normal rate, regular rhythm, S1 normal, S2 normal, normal heart sounds, intact distal pulses and normal pulses.  Exam reveals no gallop and no friction rub.   No murmur heard.  ttp over left anterior chest wall and left neck, no mass  Pulmonary/Chest: Effort normal and breath sounds normal. No tachypnea. No respiratory distress. She has no decreased breath sounds. She has no wheezes. She has no rhonchi. She has no rales.  Abdominal: Soft. Normal appearance and bowel sounds are normal. There is no tenderness.  Neurological: She is alert.  Skin: Skin is warm, dry and intact. No rash noted.  Psychiatric: Her speech is normal and behavior is normal. Judgment and thought content normal. Her mood appears not anxious. Cognition and memory are normal. She does not exhibit a depressed mood.          Assessment & Plan:

## 2016-05-10 NOTE — Assessment & Plan Note (Signed)
No sign of PE on CT, agree with referral to cardiology for cardiac eval with stress test. No sign of pancreatitis on Abd CT.  Chest seems to more likely be combination of MSK pain ( from recent coughing with bronchitis) and anxiety, possibly triggered by stopping OCPs.

## 2016-05-10 NOTE — Progress Notes (Signed)
Pre visit review using our clinic review tool, if applicable. No additional management support is needed unless otherwise documented below in the visit note. 

## 2016-05-10 NOTE — Assessment & Plan Note (Signed)
Likely triggered off hormones. GIve more time. Can use imitrex prn.

## 2016-05-10 NOTE — Patient Instructions (Addendum)
Keep appt for cardiology.  Given OCPs more time to resolve.  Make sure to take venlafaxine at night

## 2016-05-10 NOTE — Assessment & Plan Note (Signed)
Encouraged her to take venlafaxine at higher dose  More regularly.

## 2016-05-13 ENCOUNTER — Telehealth: Payer: Self-pay

## 2016-05-13 MED ORDER — OSELTAMIVIR PHOSPHATE 75 MG PO CAPS: 75.0000 mg | ORAL_CAPSULE | Freq: Every day | ORAL | 0 refills | Status: DC

## 2016-05-13 NOTE — Telephone Encounter (Signed)
Okay send in tamiflu 75 mg BID x 5 days

## 2016-05-13 NOTE — Telephone Encounter (Signed)
Jane Li notified that Jane Li has been sent into her pharmacy.

## 2016-05-13 NOTE — Telephone Encounter (Signed)
Pt left v/m; pt last seen 05/10/16; yesterday pt ran low grade fever but today temp 101.6, h/a and body aches. pt [facility] but [facility] not [facility] flu test but thinks pts husband had the flu. Pt thinks she has the flu and does not want to come in for appt. ([facility] pt request tamiflu to [facility].pt request cb.

## 2016-06-06 ENCOUNTER — Other Ambulatory Visit: Payer: Self-pay | Admitting: Family Medicine

## 2016-06-06 NOTE — Telephone Encounter (Signed)
Last office visit 05/10/2016.  Last office visit that addressed HTN 02/04/2014.  Ok to refill?

## 2016-06-07 NOTE — Telephone Encounter (Signed)
BP Readings from Last 3 Encounters:  05/10/16 120/90  05/05/16 125/80  03/22/16 120/82

## 2016-06-15 ENCOUNTER — Other Ambulatory Visit: Payer: Self-pay | Admitting: Family Medicine

## 2016-06-20 ENCOUNTER — Ambulatory Visit (INDEPENDENT_AMBULATORY_CARE_PROVIDER_SITE_OTHER): Payer: BC Managed Care – PPO | Admitting: Family Medicine

## 2016-06-20 ENCOUNTER — Ambulatory Visit: Payer: BC Managed Care – PPO | Admitting: Family Medicine

## 2016-06-20 ENCOUNTER — Encounter: Payer: Self-pay | Admitting: Family Medicine

## 2016-06-20 DIAGNOSIS — J069 Acute upper respiratory infection, unspecified: Secondary | ICD-10-CM | POA: Diagnosis not present

## 2016-06-20 MED ORDER — BENZONATATE 200 MG PO CAPS: 200.0000 mg | ORAL_CAPSULE | Freq: Two times a day (BID) | ORAL | 0 refills | Status: DC | PRN

## 2016-06-20 NOTE — Progress Notes (Signed)
Pre visit review using our clinic review tool, if applicable. No additional management support is needed unless otherwise documented below in the visit note. 

## 2016-06-20 NOTE — Progress Notes (Signed)
SUBJECTIVE:  Jane Li is a 46 y.o. female pt of Dr. Ermalene SearingBedsole, new to me,  who complains of coryza, congestion, myalgias, headache and low grade fevers for 4 days. Has been taking Mucinex D, Ibuprofen, hycodan without much improvement.  She wonders if her lisinopril is making her cough worse. She denies a history of anorexia and chest pain and denies a history of asthma. Patient denies smoke cigarettes.   Current Outpatient Prescriptions on File Prior to Visit  Medication Sig Dispense Refill  . calcium carbonate (TUMS - DOSED IN MG ELEMENTAL CALCIUM) 500 MG chewable tablet Chew 1 tablet by mouth as needed for indigestion or heartburn.    Marland Kitchen. ibuprofen (ADVIL,MOTRIN) 200 MG tablet Take 200 mg by mouth every 6 (six) hours as needed.    Marland Kitchen. lisinopril-hydrochlorothiazide (PRINZIDE,ZESTORETIC) 20-12.5 MG tablet TAKE 1 TABLET BY MOUTH DAILY. 90 tablet 0  . omeprazole (PRILOSEC) 40 MG capsule TAKE 1 CAPSULE (40 MG TOTAL) BY MOUTH DAILY. 30 capsule 5  . OVER THE COUNTER MEDICATION ESSENTIAL OILS    . Probiotic Product (PROBIOTIC PO) Take 1 capsule by mouth daily.    . pseudoephedrine-guaifenesin (MUCINEX D) 60-600 MG 12 hr tablet Take 0.5 tablets by mouth daily.     . simethicone (GAS-X) 80 MG chewable tablet Chew 80 mg by mouth as needed.     . SUMAtriptan (IMITREX) 100 MG tablet 1 tab po as needed for migraine. May repeat in 2 hours if headache persists or recurs. 10 tablet 0  . triamcinolone (NASACORT ALLERGY 24HR) 55 MCG/ACT AERO nasal inhaler Place 2 sprays into the nose daily. PM    . venlafaxine XR (EFFEXOR-XR) 75 MG 24 hr capsule Take 75 mg by mouth daily. with food  1   No current facility-administered medications on file prior to visit.     Allergies  Allergen Reactions  . Codeine Nausea And Vomiting    Past Medical History:  Diagnosis Date  . Allergy   . Chronic headaches    2/WEEK SINUS AND STRESS HEADACHES  . Deviated nasal septum   . GERD (gastroesophageal reflux disease)   .  Hyperlipidemia   . Hypertension    CONTROLLED ON MEDS  . Motion sickness    CAR, [place]USEMENT [place]  . Nasal turbinate hypertrophy    NASAL OBSTRUCTION  . Obesity   . Shortness of breath dyspnea    ON EXERTION  . Sinusitis, chronic     Past Surgical History:  Procedure Laterality Date  . CESAREAN SECTION  2002  . COLONOSCOPY    . FRONTAL SINUS EXPLORATION Bilateral 10/08/2015   Procedure: FRONTAL SINUS EXPLORATION BILATERAL;  Surgeon: Vernie MurdersPaul Juengel, MD;  [facility];  Service: ENT;  Laterality: Bilateral;  . IMAGE GUIDED SINUS SURGERY N/A 10/08/2015   Procedure: IMAGE GUIDED SINUS SURGERY;  Surgeon: Vernie MurdersPaul Juengel, MD;  [facility];  Service: ENT;  Laterality: N/A;  GAVE DISK TO [facility] 5/24 DEE UPREG  . MAXILLARY ANTROSTOMY Bilateral 10/08/2015   Procedure: MAXILLARY ANTROSTOMY BILATERAL;  Surgeon: Vernie MurdersPaul Juengel, MD;  [facility];  Service: ENT;  Laterality: Bilateral;  . SEPTOPLASTY N/A 10/08/2015   Procedure: SEPTOPLASTY;  Surgeon: Vernie MurdersPaul Juengel, MD;  [facility];  Service: ENT;  Laterality: N/A;  . TURBINATE REDUCTION Bilateral 10/08/2015   Procedure: TURBINATE REDUCTION BILATERAL;  Surgeon: Vernie MurdersPaul Juengel, MD;  [facility];  Service: ENT;  Laterality: Bilateral;  . UPPER GASTROINTESTINAL ENDOSCOPY  03-18-2014  . WISDOM TOOTH EXTRACTION  Family History  Problem Relation Age of Onset  . Depression Mother   . Mental illness Mother   . Hypertension Mother   . Arthritis Father   . Thyroid disease Father   . Mental illness Sister   . Breast cancer Maternal Grandmother   . Diabetes Paternal Grandmother   . Colon cancer Neg Hx   . Stomach cancer Neg Hx     Social History   Social History  . Marital status: Married    Spouse name: N/A  . Number of children: 2  . Years of education: N/A   Occupational History  . 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  Smoking status: Never Smoker  . Smokeless tobacco: Never Used  . Alcohol use 0.0 oz/week     Comment: rare  . Drug use: No  . Sexual activity: Yes    Partners: Male   Other Topics Concern  . Not on file   Social History Narrative   Regular exercise-yes occasionally   Diet: 3 meals daily, varied, no ff, social etoh,   The PMH, PSH, Social History, Family History, Medications, and allergies have been reviewed in [facility], and have been updated if relevant.  OBJECTIVE:  BP 110/74 (BP Location: Left Arm, Patient Position: Sitting, Cuff Size: Large)   Pulse 90   Temp 98.4 F (36.9 C) (Oral)   Wt 222 lb (100.7 kg)   SpO2 96%   BMI 36.38 kg/m   She appears well, vital signs are as noted. Ears normal.  Throat and pharynx normal.  Neck supple. No adenopathy in the neck. Nose is congested. Sinuses non tender. The chest is clear, without wheezes or rales.  ASSESSMENT:  viral upper respiratory illness  PLAN: Symptomatic therapy suggested: push fluids, rest and return office visit prn if symptoms persist or worsen. Lack of antibiotic effectiveness discussed with her. Call or return to clinic prn if these symptoms worsen or fail to improve as anticipated.

## 2016-06-23 ENCOUNTER — Ambulatory Visit (INDEPENDENT_AMBULATORY_CARE_PROVIDER_SITE_OTHER): Payer: BC Managed Care – PPO | Admitting: Family Medicine

## 2016-06-23 ENCOUNTER — Encounter: Payer: Self-pay | Admitting: Family Medicine

## 2016-06-23 VITALS — BP 90/50 | HR 114 | Temp 98.9°F | Ht 65.5 in | Wt 221.2 lb

## 2016-06-23 DIAGNOSIS — M5416 Radiculopathy, lumbar region: Secondary | ICD-10-CM | POA: Diagnosis not present

## 2016-06-23 DIAGNOSIS — M549 Dorsalgia, unspecified: Secondary | ICD-10-CM

## 2016-06-23 MED ORDER — METHYLPREDNISOLONE ACETATE 80 MG/ML IJ SUSP
80.0000 mg | Freq: Once | INTRAMUSCULAR | Status: AC
  Administered 2016-06-23: 80 mg via INTRAMUSCULAR

## 2016-06-23 MED ORDER — CYCLOBENZAPRINE HCL 10 MG PO TABS
10.0000 mg | ORAL_TABLET | Freq: Every day | ORAL | 0 refills | Status: DC
Start: 2016-06-23 — End: 2016-08-16

## 2016-06-23 MED ORDER — HYDROCODONE-ACETAMINOPHEN 5-325 MG PO TABS: 1.0000 | ORAL_TABLET | Freq: Four times a day (QID) | ORAL | 0 refills | Status: DC | PRN

## 2016-06-23 MED ORDER — PREDNISONE 20 MG PO TABS: ORAL_TABLET | ORAL | 0 refills | Status: DC

## 2016-06-23 MED ORDER — KETOROLAC TROMETHAMINE 60 MG/2ML IM SOLN
60.0000 mg | Freq: Once | INTRAMUSCULAR | Status: AC
  Administered 2016-06-23: 60 mg via INTRAMUSCULAR

## 2016-06-23 NOTE — Progress Notes (Signed)
Pre visit review using our clinic review tool, if applicable. No additional management support is needed unless otherwise documented below in the visit note. 

## 2016-06-23 NOTE — Progress Notes (Signed)
Dr. Karleen Hampshire T. Jerrard Bradburn, MD, CAQ Sports Medicine Primary Care and Sports Medicine [address] [place], [address] Phone: [phone] Fax: [phone]  06/23/2016  Patient: Jane Li, MRN: [id number], DOB: 01-Aug-1970, 46 y.o.  Primary Physician:  Kerby Nora, MD   Chief Complaint  Patient presents with  . Back Pain    since this morning. feels nauseated and dizzy only ate some french fries at lunch.    Subjective:   Jane Li is a 46 y.o. very pleasant female patient who presents with the following: Back Pain  ongoing for approximately: 1 d The patient has had back pain before. The back pain is localized into the lumbar spine area. They also describe R radiculopathy.  Went to sleep last night and woke up tonight and back was killing her.  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  Never had problems with back.   Sometimes pain down the R leg.  Hurting bad from coughing, too.   No incontinence.  Nauseated.  No blood in her urine.   No numbness or tingling. No bowel or bladder incontinence. No focal weakness. Prior interventions: nsaids Physical therapy: No Chiropractic manipulations: No Acupuncture: No Osteopathic manipulation: No Heat or cold: Minimal effect  Past Medical History, Surgical History, Family History, Medications, Allergies have been reviewed and updated if relevant.  Patient Active Problem List   Diagnosis Date Noted  . Upper respiratory infection 06/20/2016  . GAD (generalized anxiety disorder) 02/19/2016  . Abdominal pain, epigastric 10/06/2015  . Fatigue 12/19/2014  . Decreased libido 12/19/2014  . Sequelae of injury of head 12/19/2014  . Neck pain 09/05/2014  . Helicobacter pylori gastritis 11/12/2013  . High cholesterol 02/01/2013  . Elevated LFTs 06/07/2012  . Atypical chest pain 06/07/2012  . HTN (hypertension) 04/13/2012  . GERD 07/30/2009  . BMI 37.0-37.9, adult  05/11/2007  . Migraine headache without aura 05/03/2007  . Allergic rhinitis 05/03/2007  . FATIGUE, CHRONIC 08/15/2006    Past Medical History:  Diagnosis Date  . Allergy   . Chronic headaches    2/WEEK SINUS AND STRESS HEADACHES  . Deviated nasal septum   . GERD (gastroesophageal reflux disease)   . Hyperlipidemia   . Hypertension    CONTROLLED ON MEDS  . Motion sickness    CAR, AMUSEMENT PARK  . Nasal turbinate hypertrophy    NASAL OBSTRUCTION  . Obesity   . Shortness of breath dyspnea    ON EXERTION  . Sinusitis, chronic     Past Surgical History:  Procedure Laterality Date  . CESAREAN SECTION  2002  . COLONOSCOPY    . FRONTAL SINUS EXPLORATION Bilateral 10/08/2015   Procedure: FRONTAL SINUS EXPLORATION BILATERAL;  Surgeon: Vernie Murders, MD;  Location: [facility];  Service: ENT;  Laterality: Bilateral;  . IMAGE GUIDED SINUS SURGERY N/A 10/08/2015   Procedure: IMAGE GUIDED SINUS SURGERY;  Surgeon: Vernie Murders, MD;  Location: [facility];  Service: ENT;  Laterality: N/A;  GAVE DISK TO CECE 5/24 DEE UPREG  . MAXILLARY ANTROSTOMY Bilateral 10/08/2015   Procedure: MAXILLARY ANTROSTOMY BILATERAL;  Surgeon: Vernie Murders, MD;  Location: [facility];  Service: ENT;  Laterality: Bilateral;  . SEPTOPLASTY N/A 10/08/2015   Procedure: SEPTOPLASTY;  Surgeon: Vernie Murders, MD;  Location: [facility];  Service: ENT;  Laterality: N/A;  . TURBINATE REDUCTION Bilateral 10/08/2015   Procedure: TURBINATE REDUCTION BILATERAL;  Surgeon: Vernie Murders, MD;  Location: [facility];  Service: ENT;  Laterality: Bilateral;  . UPPER GASTROINTESTINAL ENDOSCOPY  03-18-2014  . WISDOM TOOTH EXTRACTION      Social History   Social History  . Marital status: Married    Spouse name: N/A  . Number of children: 2  . Years of education: N/A   Occupational History  . Teacher/Librarian [employer]   Social History Main Topics  . Smoking status:  Never Smoker  . Smokeless tobacco: Never Used  . Alcohol use 0.0 oz/week     Comment: rare  . Drug use: No  . Sexual activity: Yes    Partners: Male   Other Topics Concern  . Not on file   Social History Narrative   Regular exercise-yes occasionally   Diet: 3 meals daily, varied, no ff, social etoh,    Family History  Problem Relation Age of Onset  . Depression Mother   . Mental illness Mother   . Hypertension Mother   . Arthritis Father   . Thyroid disease Father   . Mental illness Sister   . Breast cancer Maternal Grandmother   . Diabetes Paternal Grandmother   . Colon cancer Neg Hx   . Stomach cancer Neg Hx     Allergies  Allergen Reactions  . Codeine Nausea And Vomiting    Medication list reviewed and updated in full in [facility].  GEN: No fevers, chills. Nontoxic. Primarily MSK c/o today. MSK: Detailed in the HPI GI: tolerating PO intake without difficulty Neuro: As above  Otherwise the pertinent positives of the ROS are noted above.    Objective:   Blood pressure (!) 90/50, pulse (!) 114, temperature 98.9 F (37.2 C), height 5' 5.5" (1.664 m), weight 221 lb 4 oz (100.4 kg), SpO2 96 %.  Gen: Well-developed,well-nourished,in no acute distress; alert,appropriate and cooperative throughout examination HEENT: Normocephalic and atraumatic without obvious abnormalities.  Ears, externally no deformities Pulm: Breathing comfortably in no respiratory distress Range of motion at  the waist: Flexion, rotation and lateral bending: limited  No echymosis or edema Rises to examination table with no difficulty Gait: minimally antalgic  Inspection/Deformity: No abnormality Paraspinus T:  l2-s1 ttp  B Ankle Dorsiflexion (L5,4): 5/5 B Great Toe Dorsiflexion (L5,4): 5/5 Heel Walk (L5): WNL Toe Walk (S1): WNL Rise/Squat (L4): WNL, mild pain  SENSORY B Medial Foot (L4): WNL B Dorsum (L5): WNL B Lateral (S1): WNL Light Touch: WNL Pinprick:  WNL  REFLEXES Knee (L4): 2+ Ankle (S1): 2+  B SLR, seated: + on R B SLR, supine: not done B Greater Troch: NT B Log Roll: neg B Stork: NT B Sciatic Notch: ttp  Radiology: No results found.  Assessment and Plan:   Lumbar radiculopathy, right  Back pain, unspecified back location, unspecified back pain laterality, unspecified chronicity - Plan: ketorolac (TORADOL) injection 60 mg, methylPREDNISolone acetate (DEPO-MEDROL) injection 80 mg  Probable discogenic origin.  I am Toradol and Depo-Medrol in the office  Followed by oral prednisone, pain medicine if needed, and Flexeril at night.  Follow-up: 3-4 weeks if needed  Meds ordered this encounter  Medications  . cyclobenzaprine (FLEXERIL) 10 MG tablet    Sig: Take 1 tablet (10 mg total) by mouth at bedtime.    Dispense:  30 tablet    Refill:  0  . predniSONE (DELTASONE) 20 MG tablet    Sig: 2 tabs po for 5 days, then 1 tab po for 5 days    Dispense:  15 tablet  Refill:  0  . HYDROcodone-acetaminophen (NORCO/VICODIN) 5-325 MG tablet    Sig: Take 1 tablet by mouth every 6 (six) hours as needed for moderate pain.    Dispense:  30 tablet    Refill:  0  . ketorolac (TORADOL) injection 60 mg  . methylPREDNISolone acetate (DEPO-MEDROL) injection 80 mg   There are no discontinued medications. No orders of the defined types were placed in this encounter.   Signed,  Elpidio Galea. Umberto Pavek, MD   Allergies as of 06/23/2016      Reactions   Codeine Nausea And Vomiting      Medication List       Accurate as of 06/23/16 11:59 PM. Always use your most recent med list.          benzonatate 200 MG capsule Commonly known as:  TESSALON Take 1 capsule (200 mg total) by mouth 2 (two) times daily as needed for cough.   calcium carbonate 500 MG chewable tablet Commonly known as:  TUMS - dosed in mg elemental calcium Chew 1 tablet by mouth as needed for indigestion or heartburn.   cyclobenzaprine 10 MG tablet Commonly  known as:  FLEXERIL Take 1 tablet (10 mg total) by mouth at bedtime.   GAS-X 80 MG chewable tablet Generic drug:  simethicone Chew 80 mg by mouth as needed.   HYDROcodone-acetaminophen 5-325 MG tablet Commonly known as:  NORCO/VICODIN Take 1 tablet by mouth every 6 (six) hours as needed for moderate pain.   ibuprofen 200 MG tablet Commonly known as:  ADVIL,MOTRIN Take 200 mg by mouth every 6 (six) hours as needed.   lisinopril-hydrochlorothiazide 20-12.5 MG tablet Commonly known as:  PRINZIDE,ZESTORETIC TAKE 1 TABLET BY MOUTH DAILY.   NASACORT ALLERGY 24HR 55 MCG/ACT Aero nasal inhaler Generic drug:  triamcinolone Place 2 sprays into the nose daily. PM   omeprazole 40 MG capsule Commonly known as:  PRILOSEC TAKE 1 CAPSULE (40 MG TOTAL) BY MOUTH DAILY.   OVER THE COUNTER MEDICATION ESSENTIAL OILS   predniSONE 20 MG tablet Commonly known as:  DELTASONE 2 tabs po for 5 days, then 1 tab po for 5 days   PROBIOTIC PO Take 1 capsule by mouth daily.   pseudoephedrine-guaifenesin 60-600 MG 12 hr tablet Commonly known as:  MUCINEX D Take 0.5 tablets by mouth daily.   SUMAtriptan 100 MG tablet Commonly known as:  IMITREX 1 tab po as needed for migraine. May repeat in 2 hours if headache persists or recurs.   venlafaxine XR 75 MG 24 hr capsule Commonly known as:  EFFEXOR-XR Take 75 mg by mouth daily. with food

## 2016-06-25 ENCOUNTER — Emergency Department: Payer: BC Managed Care – PPO

## 2016-06-25 ENCOUNTER — Emergency Department
Admission: EM | Admit: 2016-06-25 | Discharge: 2016-06-25 | Disposition: A | Discharge status: Home / Self Care | Payer: BC Managed Care – PPO | Attending: Emergency Medicine | Admitting: Emergency Medicine

## 2016-06-25 ENCOUNTER — Encounter: Payer: Self-pay | Admitting: Emergency Medicine

## 2016-06-25 DIAGNOSIS — Z791 Long term (current) use of non-steroidal anti-inflammatories (NSAID): Secondary | ICD-10-CM | POA: Diagnosis not present

## 2016-06-25 DIAGNOSIS — M5136 Other intervertebral disc degeneration, lumbar region: Secondary | ICD-10-CM | POA: Diagnosis not present

## 2016-06-25 DIAGNOSIS — I10 Essential (primary) hypertension: Secondary | ICD-10-CM | POA: Diagnosis not present

## 2016-06-25 DIAGNOSIS — Z79899 Other long term (current) drug therapy: Secondary | ICD-10-CM | POA: Diagnosis not present

## 2016-06-25 DIAGNOSIS — M545 Low back pain, unspecified: Secondary | ICD-10-CM

## 2016-06-25 LAB — URINALYSIS, COMPLETE (UACMP) WITH MICROSCOPIC
Bilirubin Urine: NEGATIVE
Glucose, UA: NEGATIVE mg/dL
HGB URINE DIPSTICK: NEGATIVE
Ketones, ur: NEGATIVE mg/dL
LEUKOCYTES UA: NEGATIVE
NITRITE: NEGATIVE
PROTEIN: 30 mg/dL — AB
SPECIFIC GRAVITY, URINE: 1.028 (ref 1.005–1.030)
pH: 5 (ref 5.0–8.0)

## 2016-06-25 LAB — CBC WITH DIFFERENTIAL/PLATELET
BASOS ABS: 0.1 10*3/uL (ref 0–0.1)
Basophils Relative: 1 %
EOS PCT: 0 %
Eosinophils Absolute: 0 10*3/uL (ref 0–0.7)
HEMATOCRIT: 36.2 % (ref 35.0–47.0)
Hemoglobin: 12.7 g/dL (ref 12.0–16.0)
LYMPHS ABS: 1.9 10*3/uL (ref 1.0–3.6)
LYMPHS PCT: 17 %
MCH: 31.4 pg (ref 26.0–34.0)
MCHC: 35 g/dL (ref 32.0–36.0)
MCV: 89.6 fL (ref 80.0–100.0)
Monocytes Absolute: 0.9 10*3/uL (ref 0.2–0.9)
Monocytes Relative: 8 %
NEUTROS ABS: 8.2 10*3/uL — AB (ref 1.4–6.5)
Neutrophils Relative %: 74 %
Platelets: 238 10*3/uL (ref 150–440)
RBC: 4.04 MIL/uL (ref 3.80–5.20)
RDW: 13.3 % (ref 11.5–14.5)
WBC: 11 10*3/uL (ref 3.6–11.0)

## 2016-06-25 LAB — BASIC METABOLIC PANEL
ANION GAP: 5 (ref 5–15)
BUN: 21 mg/dL — AB (ref 6–20)
CHLORIDE: 102 mmol/L (ref 101–111)
CO2: 29 mmol/L (ref 22–32)
Calcium: 8.8 mg/dL — ABNORMAL LOW (ref 8.9–10.3)
Creatinine, Ser: 0.67 mg/dL (ref 0.44–1.00)
GFR calc Af Amer: >60 mL/min (ref >60)
GFR calc non Af Amer: >60 mL/min (ref >60)
GLUCOSE: 92 mg/dL (ref 65–99)
POTASSIUM: 3.3 mmol/L — AB (ref 3.5–5.1)
Sodium: 136 mmol/L (ref 135–145)

## 2016-06-25 LAB — POC URINE PREG, ED: PREG TEST UR: NEGATIVE

## 2016-06-25 MED ORDER — FENTANYL CITRATE (PF) 100 MCG/2ML IJ SOLN
INTRAMUSCULAR | Status: AC
  Administered 2016-06-25: 50 ug via INTRAVENOUS
  Filled 2016-06-25: qty 2

## 2016-06-25 MED ORDER — ONDANSETRON HCL 4 MG/2ML IJ SOLN
4.0000 mg | Freq: Once | INTRAMUSCULAR | Status: AC
  Administered 2016-06-25: 4 mg via INTRAVENOUS

## 2016-06-25 MED ORDER — DIAZEPAM 5 MG PO TABS
5.0000 mg | ORAL_TABLET | Freq: Once | ORAL | Status: AC
  Administered 2016-06-25: 5 mg via ORAL
  Filled 2016-06-25: qty 1

## 2016-06-25 MED ORDER — HYDROMORPHONE HCL 1 MG/ML IJ SOLN
1.0000 mg | Freq: Once | INTRAMUSCULAR | Status: AC
  Administered 2016-06-25: 1 mg via INTRAVENOUS
  Filled 2016-06-25: qty 1

## 2016-06-25 MED ORDER — FENTANYL CITRATE (PF) 100 MCG/2ML IJ SOLN
50.0000 ug | Freq: Once | INTRAMUSCULAR | Status: AC
  Administered 2016-06-25: 50 ug via INTRAVENOUS

## 2016-06-25 MED ORDER — ONDANSETRON HCL 4 MG/2ML IJ SOLN
INTRAMUSCULAR | Status: AC
  Administered 2016-06-25: 4 mg via INTRAVENOUS
  Filled 2016-06-25: qty 2

## 2016-06-25 MED ORDER — LORAZEPAM 2 MG/ML IJ SOLN
1.0000 mg | Freq: Once | INTRAMUSCULAR | Status: AC
  Administered 2016-06-25: 1 mg via INTRAVENOUS
  Filled 2016-06-25: qty 1

## 2016-06-25 NOTE — ED Notes (Signed)
Patient brought back from MRI, MRI tech states patient refused MRI.  MD to be notified.

## 2016-06-25 NOTE — ED Notes (Signed)
Signature pad not working.  Patient verbalized understanding of discharge paperwork and has no further questions. 

## 2016-06-25 NOTE — ED Triage Notes (Signed)
[facility] with complaint of low back pain. Patient states she was seen by her PCP and was prescribed norco, cyclobenzaprine, and prednisone. No resolution of symptoms with medication administration. Patient is non-ambulatory at this time due to CC

## 2016-06-25 NOTE — ED Provider Notes (Signed)
[facility] Emergency Department Provider Note  ____________________________________________   First MD Initiated Contact with Patient 06/25/16 1004     (approximate)  I have reviewed the triage vital signs and the nursing notes.   HISTORY  Chief Complaint Back Pain   HPI Jane Li is a 46 y.o. female who is presenting with right lower back pain. She was seen several days ago by sports medicine doctor who is treating the pain is a musculoskeletal issue with prednisone, Flexeril as well as hydrocodone. The patient says that the pain worsened last night/early this morning. She took 1 dose of hydrocodone at home as well as a muscle relaxer without pain relief. She describes the pain as a 10 out of 10 right now on constant and cramping. She says it is worse with movement. Denies any pain with urination. Denies any nausea vomiting or diarrhea. No history of kidney stones. Says the pain radiates on the back and front of her right leg. Denies any injury or heavy lifting to instigate this pain. Denies any loss of bowel or bladder continence. Past Medical History:  Diagnosis Date  . Allergy   . Chronic headaches    2/WEEK SINUS AND STRESS HEADACHES  . Deviated nasal septum   . GERD (gastroesophageal reflux disease)   . Hyperlipidemia   . Hypertension    CONTROLLED ON MEDS  . Motion sickness    CAR, AMUSEMENT PARK  . Nasal turbinate hypertrophy    NASAL OBSTRUCTION  . Obesity   . Shortness of breath dyspnea    ON EXERTION  . Sinusitis, chronic     Patient Active Problem List   Diagnosis Date Noted  . Upper respiratory infection 06/20/2016  . GAD (generalized anxiety disorder) 02/19/2016  . Abdominal pain, epigastric 10/06/2015  . Fatigue 12/19/2014  . Decreased libido 12/19/2014  . Sequelae of injury of head 12/19/2014  . Neck pain 09/05/2014  . Helicobacter pylori gastritis 11/12/2013  . High cholesterol 02/01/2013  . Elevated LFTs 06/07/2012    . Atypical chest pain 06/07/2012  . HTN (hypertension) 04/13/2012  . GERD 07/30/2009  . BMI 37.0-37.9, adult 05/11/2007  . Migraine headache without aura 05/03/2007  . Allergic rhinitis 05/03/2007  . FATIGUE, CHRONIC 08/15/2006    Past Surgical History:  Procedure Laterality Date  . CESAREAN SECTION  2002  . COLONOSCOPY    . FRONTAL SINUS EXPLORATION Bilateral 10/08/2015   Procedure: FRONTAL SINUS EXPLORATION BILATERAL;  Surgeon: Vernie Murders, MD;  Location: [facility];  Service: ENT;  Laterality: Bilateral;  . IMAGE GUIDED SINUS SURGERY N/A 10/08/2015   Procedure: IMAGE GUIDED SINUS SURGERY;  Surgeon: Vernie Murders, MD;  Location: [facility];  Service: ENT;  Laterality: N/A;  GAVE DISK TO CECE 5/24 DEE UPREG  . MAXILLARY ANTROSTOMY Bilateral 10/08/2015   Procedure: MAXILLARY ANTROSTOMY BILATERAL;  Surgeon: Vernie Murders, MD;  Location: [facility];  Service: ENT;  Laterality: Bilateral;  . SEPTOPLASTY N/A 10/08/2015   Procedure: SEPTOPLASTY;  Surgeon: Vernie Murders, MD;  Location: [facility];  Service: ENT;  Laterality: N/A;  . TURBINATE REDUCTION Bilateral 10/08/2015   Procedure: TURBINATE REDUCTION BILATERAL;  Surgeon: Vernie Murders, MD;  Location: [facility];  Service: ENT;  Laterality: Bilateral;  . UPPER GASTROINTESTINAL ENDOSCOPY  03-18-2014  . WISDOM TOOTH EXTRACTION      Prior to Admission medications   Medication Sig Start Date End Date Taking? Authorizing Provider  cyclobenzaprine (FLEXERIL) 10 MG tablet Take 1 tablet (10 mg  total) by mouth at bedtime. 06/23/16  Yes Hannah BeatSpencer Copland, MD  HYDROcodone-acetaminophen (NORCO/VICODIN) 5-325 MG tablet Take 1 tablet by mouth every 6 (six) hours as needed for moderate pain. 06/23/16  Yes Spencer Copland, MD  ibuprofen (ADVIL,MOTRIN) 200 MG tablet Take 200 mg by mouth every 6 (six) hours as needed.   Yes Historical Provider, MD  lisinopril-hydrochlorothiazide (PRINZIDE,ZESTORETIC) 20-12.5 MG tablet  TAKE 1 TABLET BY MOUTH DAILY. 06/07/16  Yes Amy Michelle NasutiE Bedsole, MD  benzonatate (TESSALON) 200 MG capsule Take 1 capsule (200 mg total) by mouth 2 (two) times daily as needed for cough. 06/20/16   Dianne Dunalia M Aron, MD  calcium carbonate (TUMS - DOSED IN MG ELEMENTAL CALCIUM) 500 MG chewable tablet Chew 1 tablet by mouth as needed for indigestion or heartburn.    Historical Provider, MD  omeprazole (PRILOSEC) 40 MG capsule TAKE 1 CAPSULE (40 MG TOTAL) BY MOUTH DAILY. 02/07/16   Amy Michelle NasutiE Bedsole, MD  OVER THE COUNTER MEDICATION ESSENTIAL OILS    Historical Provider, MD  predniSONE (DELTASONE) 20 MG tablet 2 tabs po for 5 days, then 1 tab po for 5 days 06/23/16   Hannah BeatSpencer Copland, MD  Probiotic Product (PROBIOTIC PO) Take 1 capsule by mouth daily.    Historical Provider, MD  pseudoephedrine-guaifenesin (MUCINEX D) 60-600 MG 12 hr tablet Take 0.5 tablets by mouth daily.     Historical Provider, MD  simethicone (GAS-X) 80 MG chewable tablet Chew 80 mg by mouth as needed.     Historical Provider, MD  SUMAtriptan (IMITREX) 100 MG tablet 1 tab po as needed for migraine. May repeat in 2 hours if headache persists or recurs. 05/05/16   Amy Michelle NasutiE Bedsole, MD  triamcinolone (NASACORT ALLERGY 24HR) 55 MCG/ACT AERO nasal inhaler Place 2 sprays into the nose daily. PM    Historical Provider, MD  venlafaxine XR (EFFEXOR-XR) 75 MG 24 hr capsule Take 75 mg by mouth daily. with food 04/14/16   Historical Provider, MD    Allergies Codeine  Family History  Problem Relation Age of Onset  . Depression Mother   . Mental illness Mother   . Hypertension Mother   . Arthritis Father   . Thyroid disease Father   . Mental illness Sister   . Breast cancer Maternal Grandmother   . Diabetes Paternal Grandmother   . Colon cancer Neg Hx   . Stomach cancer Neg Hx     Social History Social History  Substance Use Topics  . Smoking status: Never Smoker  . Smokeless tobacco: Never Used  . Alcohol use 0.0 oz/week     Comment: rare     Review of Systems Constitutional: No fever/chills Eyes: No visual changes. ENT: No sore throat. Cardiovascular: Denies chest pain. Respiratory: Denies shortness of breath. Gastrointestinal: No abdominal pain.  No nausea, no vomiting.  No diarrhea.  No constipation. Genitourinary: Negative for dysuria. Musculoskeletal:as above Skin: Negative for rash. Neurological: Negative for headaches, focal weakness or numbness.  10-point ROS otherwise negative.  ____________________________________________   PHYSICAL EXAM:  VITAL SIGNS: ED Triage Vitals  Enc Vitals Group     BP 06/25/16 1030 111/68     Pulse Rate 06/25/16 1030 82     Resp --      Temp --      Temp src --      SpO2 06/25/16 1030 98 %     Weight 06/25/16 1011 221 lb (100.2 kg)     Height 06/25/16 1011 5' 5.5" (1.664 m)  Head Circumference --      Peak Flow --      Pain Score 06/25/16 1011 9     Pain Loc --      Pain Edu? --      Excl. in GC? --     Constitutional: Alert and oriented. Appears uncomfortable. Eyes: Conjunctivae are normal. PERRL. EOMI. Head: Atraumatic. Nose: No congestion/rhinnorhea. Mouth/Throat: Mucous membranes are moist.   Neck: No stridor.   Cardiovascular: Normal rate, regular rhythm. Grossly normal heart sounds.  Good peripheral circulation with equal and bilateral dorsalis pedis pulses. Respiratory: Normal respiratory effort.  No retractions. Lungs CTAB. Gastrointestinal: Soft and nontender. No distention.  Musculoskeletal: No lower extremity tenderness nor edema.  No joint effusions.  Tenderness palpation of the right lateral lumbar region. No deformity or step-off or tenderness palpation to the midline. Negative straight leg raises bilaterally. No saddle anesthesia. 5 out of 5 strength to bilateral lower summary's but with pain to active range of motion to the right low lumbar region.  Neurologic:  Normal speech and language. No gross focal neurologic deficits are  appreciated  Skin:  Skin is warm, dry and intact. No rash noted. Psychiatric: Mood and affect are normal. Speech and behavior are normal.  ____________________________________________   LABS (all labs ordered are listed, but only abnormal results are displayed)  Labs Reviewed  CBC WITH DIFFERENTIAL/PLATELET - Abnormal; Notable for the following:       Result Value   Neutro Abs 8.2 (*)    All other components within normal limits  URINALYSIS, COMPLETE (UACMP) WITH MICROSCOPIC  BASIC METABOLIC PANEL  POC URINE PREG, ED   ____________________________________________  EKG   ____________________________________________  RADIOLOGY   ____________________________________________   PROCEDURES  Procedure(s) performed:   Procedures  Critical Care performed:   ____________________________________________   INITIAL IMPRESSION / ASSESSMENT AND PLAN / ED COURSE  Pertinent labs & imaging results that were available during my care of the patient were reviewed by me and considered in my medical decision making (see chart for details).  ----------------------------------------- 12:25 PM on 06/25/2016 -----------------------------------------  Patient says that her pain is an 8 out of 10After Valium.    ----------------------------------------- 3:07 PM on 06/25/2016 -----------------------------------------  Patient says that the pain was temporarily relieved after 50 g of fentanyl with a one-day x-ray and was manipulated and now feels that the pain is back to a 10 out of 10. We will give Dilaudid. Patient to have an MRI of the lumbar spine. Tender to Dr. Derrill Kay.  ____________________________________________   FINAL CLINICAL IMPRESSION(S) / ED DIAGNOSES  Final diagnoses:  Low back pain      NEW MEDICATIONS STARTED DURING THIS VISIT:  New Prescriptions   No medications on file     Note:  This document was prepared using Dragon voice recognition software and  may include unintentional dictation errors.    Myrna Blazer, MD 06/25/16 [id number]

## 2016-06-25 NOTE — ED Notes (Signed)
Patient transported to X-ray 

## 2016-06-25 NOTE — ED Notes (Signed)
Patient transported to MRI 

## 2016-06-25 NOTE — Discharge Instructions (Signed)
Please seek medical attention for any high fevers, chest pain, shortness of breath, change in behavior, persistent vomiting, bloody stool or any other new or concerning symptoms.  

## 2016-06-25 NOTE — ED Notes (Signed)
MD at bedside. 

## 2016-06-25 NOTE — ED Provider Notes (Signed)
-----------------------------------------   4:15 PM on 06/25/2016 -----------------------------------------  Had a discussion with patient and family. Apparently patient was concerned for MRI and claustrophobia. Discussed with the patient that we could try some Ativan. Patient seemed willing to try Ativan and will retry getting the MRI.   ----------------------------------------- 5:50 PM on 06/25/2016 -----------------------------------------  MRI lumbar spine IMPRESSION:  1. Lumbar spondylosis and degenerative disc disease, causing mild  impingement at L2- 3, L3-4, and L4-5, as detailed above.   Discussed findings with patient and family.    Phineas SemenGraydon Willine Schwalbe, MD 06/25/16 [phone]

## 2016-07-13 ENCOUNTER — Other Ambulatory Visit: Payer: Self-pay | Admitting: Family Medicine

## 2016-08-02 ENCOUNTER — Other Ambulatory Visit: Payer: Self-pay | Admitting: Family Medicine

## 2016-08-10 ENCOUNTER — Encounter: Payer: Self-pay | Admitting: Internal Medicine

## 2016-08-10 ENCOUNTER — Ambulatory Visit (INDEPENDENT_AMBULATORY_CARE_PROVIDER_SITE_OTHER): Payer: BC Managed Care – PPO | Admitting: Internal Medicine

## 2016-08-10 VITALS — BP 102/70 | HR 122 | Temp 98.5°F | Wt 213.0 lb

## 2016-08-10 DIAGNOSIS — M25572 Pain in left ankle and joints of left foot: Secondary | ICD-10-CM | POA: Diagnosis not present

## 2016-08-10 DIAGNOSIS — M25571 Pain in right ankle and joints of right foot: Secondary | ICD-10-CM | POA: Diagnosis not present

## 2016-08-10 DIAGNOSIS — M25579 Pain in unspecified ankle and joints of unspecified foot: Secondary | ICD-10-CM | POA: Insufficient documentation

## 2016-08-10 NOTE — Assessment & Plan Note (Signed)
Has noticed since chiropractic visit 2 days ago No obvious trauma but seems to have findings of soft tissue damage (may be related to abnormal gait due to possible leg length discrepancy now---- right hip is slightly higher than left) No evidence of infection or systemic disease Ibuprofen helps-- discussed continuing and using better support shoes Sports medicine (Dr C) if persists No clear reason to think this was related to chiropractic issue

## 2016-08-10 NOTE — Patient Instructions (Signed)
Please continue the ibuprofen and wear the good support shoes. If your ankles are not improving over the next couple of days, please make an appointment with Dr Patsy Lager for sports medicine evaluation.

## 2016-08-10 NOTE — Progress Notes (Signed)
Subjective:    Patient ID: Jane Li, female    DOB: [birth year], 46 y.o.   MRN: [id number]  HPI Here due to ankle pain  Has been battling low back painn for 7 weeks Diagnosed with DDD Not going for surgery so going to pain clinic and chiropractor  Noted some ankle itching after chiropractic visit 2 days ago (got laser therapy) Looked down and noticed redness and was sore to touch--medial right ankle Then, this morning--couldn't put weight on either foot Some redness on lateral left ankle  No known tick or other insect bites No recent time outside (since baseball game over a week ago)  Had fever this AM--- 100.6  Current Outpatient Prescriptions on File Prior to Visit  Medication Sig Dispense Refill  . calcium carbonate (TUMS - DOSED IN MG ELEMENTAL CALCIUM) 500 MG chewable tablet Chew 1 tablet by mouth as needed for indigestion or heartburn.    . cyclobenzaprine (FLEXERIL) 10 MG tablet Take 1 tablet (10 mg total) by mouth at bedtime. 30 tablet 0  . HYDROcodone-acetaminophen (NORCO/VICODIN) 5-325 MG tablet Take 1 tablet by mouth every 6 (six) hours as needed for moderate pain. 30 tablet 0  . ibuprofen (ADVIL,MOTRIN) 200 MG tablet Take 200 mg by mouth every 6 (six) hours as needed.    Marland Kitchen lisinopril-hydrochlorothiazide (PRINZIDE,ZESTORETIC) 20-12.5 MG tablet TAKE 1 TABLET BY MOUTH DAILY. 90 tablet 0  . omeprazole (PRILOSEC) 40 MG capsule TAKE 1 CAPSULE (40 MG TOTAL) BY MOUTH DAILY. 90 capsule 1  . OVER THE COUNTER MEDICATION ESSENTIAL OILS    . Probiotic Product (PROBIOTIC PO) Take 1 capsule by mouth daily.    . pseudoephedrine-guaifenesin (MUCINEX D) 60-600 MG 12 hr tablet Take 0.5 tablets by mouth daily as needed.     . simethicone (GAS-X) 80 MG chewable tablet Chew 80 mg by mouth as needed.     . SUMAtriptan (IMITREX) 100 MG tablet 1 tab po as needed for migraine. May repeat in 2 hours if headache persists or recurs. 10 tablet 0  . triamcinolone (NASACORT ALLERGY 24HR) 55  MCG/ACT AERO nasal inhaler Place 2 sprays into the nose daily. PM     No current facility-administered medications on file prior to visit.     Allergies  Allergen Reactions  . Codeine Nausea And Vomiting    Past Medical History:  Diagnosis Date  . Allergy   . Chronic headaches    2/WEEK SINUS AND STRESS HEADACHES  . Deviated nasal septum   . GERD (gastroesophageal reflux disease)   . Hyperlipidemia   . Hypertension    CONTROLLED ON MEDS  . Motion sickness    [facility] [place]  . Nasal turbinate hypertrophy    NASAL OBSTRUCTION  . Obesity   . Shortness of breath dyspnea    ON EXERTION  . Sinusitis, chronic     Past Surgical History:  Procedure Laterality Date  . CESAREAN SECTION  2002  . COLONOSCOPY    . FRONTAL SINUS EXPLORATION Bilateral 10/08/2015   Procedure: FRONTAL SINUS EXPLORATION BILATERAL;  Surgeon: Vernie Murders, MD;  Location: [facility];  Service: ENT;  Laterality: Bilateral;  . IMAGE GUIDED SINUS SURGERY N/A 10/08/2015   Procedure: IMAGE GUIDED SINUS SURGERY;  Surgeon: Vernie Murders, MD;  Location: [facility];  Service: ENT;  Laterality: N/A;  GAVE DISK TO [facility] 5/24 DEE UPREG  . MAXILLARY ANTROSTOMY Bilateral 10/08/2015   Procedure: MAXILLARY ANTROSTOMY BILATERAL;  Surgeon: Vernie Murders, MD;  Location: [facility];  Service: ENT;  Laterality: Bilateral;  . SEPTOPLASTY N/A 10/08/2015   Procedure: SEPTOPLASTY;  Surgeon: Vernie Murders, MD;  Location: [facility];  Service: ENT;  Laterality: N/A;  . TURBINATE REDUCTION Bilateral 10/08/2015   Procedure: TURBINATE REDUCTION BILATERAL;  Surgeon: Vernie Murders, MD;  Location: [facility];  Service: ENT;  Laterality: Bilateral;  . UPPER GASTROINTESTINAL ENDOSCOPY  03-18-2014  . WISDOM TOOTH EXTRACTION      Family History  Problem Relation Age of Onset  . Depression Mother   . Mental illness Mother   . Hypertension Mother   . Arthritis Father   . Thyroid disease Father   .  Mental illness Sister   . Breast cancer Maternal Grandmother   . Diabetes Paternal Grandmother   . Colon cancer Neg Hx   . Stomach cancer Neg Hx     Social History   Social History  . Marital status: Married    Spouse name: N/A  . Number of children: 2  . Years of education: N/A   Occupational History  . 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 Smoking status: Never Smoker  . Smokeless tobacco: Never Used  . Alcohol use 0.0 oz/week     Comment: rare  . Drug use: No  . Sexual activity: Yes    Partners: Male   Other Topics Concern  . Not on file   Social History Narrative   Regular exercise-yes occasionally   Diet: 3 meals daily, varied, no ff, social etoh,   Review of Systems No other joint swelling or pain No other rash Slight nagging cough-- no recent change (relates to lisinopril) No SOB Nausea but no vomiting Appetite is okay Some weight loss since back pain started (relates to the medication causing nausea and decreased eating)    Objective:   Physical Exam  Constitutional: She appears well-nourished. No distress.  Musculoskeletal:  No ankle or other joint swelling Slight reddish discoloration along medial posterior right ankle with swelling and point tenderness and pain with ROM of ankle No rash but slight lateral swelling on left posterior ankle without any bony tenderness          Assessment & Plan:

## 2016-08-10 NOTE — Progress Notes (Signed)
Pre visit review using our clinic review tool, if applicable. No additional management support is needed unless otherwise documented below in the visit note. 

## 2016-08-16 ENCOUNTER — Encounter: Payer: Self-pay | Admitting: Family Medicine

## 2016-08-16 ENCOUNTER — Ambulatory Visit (INDEPENDENT_AMBULATORY_CARE_PROVIDER_SITE_OTHER)
Admission: RE | Admit: 2016-08-16 | Discharge: 2016-08-16 | Disposition: A | Discharge status: Home / Self Care | Payer: BC Managed Care – PPO | Source: Ambulatory Visit | Attending: Family Medicine | Admitting: Family Medicine

## 2016-08-16 ENCOUNTER — Ambulatory Visit (INDEPENDENT_AMBULATORY_CARE_PROVIDER_SITE_OTHER): Payer: BC Managed Care – PPO | Admitting: Family Medicine

## 2016-08-16 VITALS — BP 100/72 | HR 119 | Temp 98.4°F | Ht 62.0 in | Wt 210.0 lb

## 2016-08-16 DIAGNOSIS — M25571 Pain in right ankle and joints of right foot: Secondary | ICD-10-CM

## 2016-08-16 LAB — CBC WITH DIFFERENTIAL/PLATELET
BASOS PCT: 0.7 % (ref 0.0–3.0)
Basophils Absolute: 0.1 10*3/uL (ref 0.0–0.1)
EOS ABS: 0.1 10*3/uL (ref 0.0–0.7)
EOS PCT: 0.7 % (ref 0.0–5.0)
HEMATOCRIT: 37.9 % (ref 36.0–46.0)
HEMOGLOBIN: 12.7 g/dL (ref 12.0–15.0)
Lymphocytes Relative: 18.5 % (ref 12.0–46.0)
Lymphs Abs: 2.1 10*3/uL (ref 0.7–4.0)
MCHC: 33.5 g/dL (ref 30.0–36.0)
MCV: 88.4 fl (ref 78.0–100.0)
MONOS PCT: 6.4 % (ref 3.0–12.0)
Monocytes Absolute: 0.7 10*3/uL (ref 0.1–1.0)
NEUTROS ABS: 8.4 10*3/uL — AB (ref 1.4–7.7)
Neutrophils Relative %: 73.7 % (ref 43.0–77.0)
PLATELETS: 408 10*3/uL — AB (ref 150.0–400.0)
RBC: 4.29 Mil/uL (ref 3.87–5.11)
RDW: 13.8 % (ref 11.5–15.5)
WBC: 11.3 10*3/uL — AB (ref 4.0–10.5)

## 2016-08-16 LAB — URIC ACID: Uric Acid, Serum: 5.4 mg/dL (ref 2.4–7.0)

## 2016-08-16 MED ORDER — PREDNISONE 20 MG PO TABS: ORAL_TABLET | ORAL | 0 refills | Status: DC

## 2016-08-16 NOTE — Progress Notes (Signed)
Subjective:    Patient ID: Jane Li, female    DOB: 1970/05/19, 46 y.o.   MRN: [id number]  HPI   46 year old female presents with new onset pain in right ankle  1 week.   She reports sudden onset redness and itching in right medial ankle 08/09/2016.  No known bite, exposure. Pain at rest and with pressure.  Severe pain.. Bringing tears to her eyes. Area of redness not spreading, but warm. Pain in medial ankle, foot and pain radiating up right leg to calf. Pain increases with flexing ankle.   Evaluated by Dr. Alphonsus Sias on  5/2 for pain in both ankle.  Recommended using ibuprofen, resting.   Saw  Urgent care MD on 5/4.Marland Kitchen Felt possible insect bite, with cellulitis. Prescribed Augmentin . Has now been on Augmentin for 4 days.. No change in pain, or redness.  No fever.  She has felt nauseated (after started augmentin), but no other flu like symptoms.  Good po intake. Nml UOP.  She has been less active in last few months given low back pain.  Dad with gout.  Blood pressure 100/72, pulse (!) 119, temperature 98.4 F (36.9 C), temperature source Oral, height 5\' 2"  (1.575 m), weight 210 lb (95.3 kg).  Review of Systems  Constitutional: Negative for fatigue and fever.  HENT: Negative for congestion.   Eyes: Negative for pain.  Respiratory: Negative for cough and shortness of breath.   Cardiovascular: Negative for chest pain, palpitations and leg swelling.  Gastrointestinal: Negative for abdominal pain.  Genitourinary: Negative for dysuria and vaginal bleeding.  Musculoskeletal: Positive for back pain.  Neurological: Negative for syncope, light-headedness and headaches.  Psychiatric/Behavioral: Negative for dysphoric mood.       Objective:   Physical Exam  Constitutional: Vital signs are normal. She appears well-developed and well-nourished. She is cooperative.  Non-toxic appearance. She does not appear ill. No distress.  HENT:  Head: Normocephalic.  Right Ear: Hearing,  tympanic membrane, external ear and ear canal normal. Tympanic membrane is not erythematous, not retracted and not bulging.  Left Ear: Hearing, tympanic membrane, external ear and ear canal normal. Tympanic membrane is not erythematous, not retracted and not bulging.  Nose: No mucosal edema or rhinorrhea. Right sinus exhibits no maxillary sinus tenderness and no frontal sinus tenderness. Left sinus exhibits no maxillary sinus tenderness and no frontal sinus tenderness.  Mouth/Throat: Uvula is midline, oropharynx is clear and moist and mucous membranes are normal.  Eyes: Conjunctivae, EOM and lids are normal. Pupils are equal, round, and reactive to light. Lids are everted and swept, no foreign bodies found.  Neck: Trachea normal and normal range of motion. Neck supple. Carotid bruit is not present. No thyroid mass and no thyromegaly present.  Cardiovascular: Normal rate, regular rhythm, S1 normal, S2 normal, normal heart sounds, intact distal pulses and normal pulses.  Exam reveals no gallop and no friction rub.   No murmur heard. Pulmonary/Chest: Effort normal and breath sounds normal. No tachypnea. No respiratory distress. She has no decreased breath sounds. She has no wheezes. She has no rhonchi. She has no rales.  Abdominal: Soft. Normal appearance and bowel sounds are normal. There is no tenderness.  Musculoskeletal:       Right ankle: She exhibits decreased range of motion and swelling. Tenderness. Medial malleolus tenderness found. No head of 5th metatarsal and no proximal fibula tenderness found. Achilles tendon normal.       Left ankle: Normal. She exhibits normal range of motion.  No tenderness.       Feet:  circled area of redness, warmth and severe pain to palpation.  Neurological: She is alert.  Skin: Skin is warm, dry and intact. No rash noted.  Psychiatric: Her speech is normal and behavior is normal. Judgment and thought content normal. Her mood appears not anxious. Cognition and  memory are normal. She does not exhibit a depressed mood.          Assessment & Plan:

## 2016-08-16 NOTE — Progress Notes (Signed)
Pre visit review using our clinic review tool, if applicable. No additional management support is needed unless otherwise documented below in the visit note. 

## 2016-08-16 NOTE — Patient Instructions (Signed)
Ice and elevate right ankle.   Stop at lab and X-ray on way out. We will call with labs and  X-ray results before the end of the day. Start prednisone taper for presumed gout flare. Stop ibuprofen while prednisone.  Can use tramadol for pain. Continue antibiotics for now. Go to ER for shortness of breath.

## 2016-08-16 NOTE — Assessment & Plan Note (Signed)
Not improved with Augmentin which is broad antibiotic.Marland Kitchen. Not worse as expected for cellulitis, not covered. Doubt infection at this point. Will eval wbc.  More concerning for gout flare, runs in pt's family. Will eval uric acid and treat with pred as this is more likely diagnosis at this point.  Pt does have swelling in right leg and has been less active.. No other risk factors for DVT. If uric acid nml consider US right leg.  Doubt fracture but given level of pain.Marland Kitchen. eval with X-ray.

## 2016-08-17 MED ORDER — DOXYCYCLINE HYCLATE 100 MG PO CAPS: 100.0000 mg | ORAL_CAPSULE | Freq: Two times a day (BID) | ORAL | 0 refills | Status: DC

## 2016-08-17 NOTE — Addendum Note (Signed)
Addended by: Damita LackLORING, DONNA S on: 08/17/2016 09:32 AM   Modules accepted: Orders

## 2016-08-19 ENCOUNTER — Ambulatory Visit (INDEPENDENT_AMBULATORY_CARE_PROVIDER_SITE_OTHER): Payer: BC Managed Care – PPO | Admitting: Family Medicine

## 2016-08-19 ENCOUNTER — Encounter: Payer: Self-pay | Admitting: Family Medicine

## 2016-08-19 VITALS — BP 100/64 | HR 99 | Temp 98.4°F | Ht 62.0 in | Wt 211.8 lb

## 2016-08-19 DIAGNOSIS — B373 Candidiasis of vulva and vagina: Secondary | ICD-10-CM

## 2016-08-19 DIAGNOSIS — M25571 Pain in right ankle and joints of right foot: Secondary | ICD-10-CM | POA: Diagnosis not present

## 2016-08-19 DIAGNOSIS — B3731 Acute candidiasis of vulva and vagina: Secondary | ICD-10-CM

## 2016-08-19 MED ORDER — FLUCONAZOLE 150 MG PO TABS: 150.0000 mg | ORAL_TABLET | Freq: Once | ORAL | 0 refills | Status: AC

## 2016-08-19 NOTE — Progress Notes (Signed)
Pre visit review using our clinic review tool, if applicable. No additional management support is needed unless otherwise documented below in the visit note. 

## 2016-08-19 NOTE — Assessment & Plan Note (Signed)
PTC treatment until completed antibiotics... Then fill rx for oral diflucan.

## 2016-08-19 NOTE — Assessment & Plan Note (Signed)
Cellulitis versus pseudogout.  Complete course of antibiotics as improvement with broadened treatment.  Can try dose of prednisone to see if helpful given possibility of pseudogout... Given improving no clear indication for joint aspiration.

## 2016-08-19 NOTE — Progress Notes (Addendum)
   Subjective:    Patient ID: Jane DraftsAngela B Snowball, female    DOB: 03/21/1971, 46 y.o.   MRN: [id number]  HPI    46 year old female with  Presumed cellulitis right ankle.  Uric acid nml  X-ray ankle nml  wbc was slightly elevated.   No improvement with Augmentin so changed to doxycycline to cover MRSA.  Today she reports pain is better, now able to weight bear on it. No fever, no redness spreading, streaking.  She is having some vaginal discharge and itching.. She is using mycolog cream.   Father has pseudogout not gout actually.   Review of Systems  Constitutional: Negative for fatigue and fever.  HENT: Negative for ear pain.   Eyes: Negative for pain.  Respiratory: Negative for chest tightness and shortness of breath.   Cardiovascular: Negative for chest pain, palpitations and leg swelling.  Gastrointestinal: Negative for abdominal pain.  Genitourinary: Negative for dysuria.       Objective:   Physical Exam  Constitutional: Vital signs are normal. She appears well-developed and well-nourished. She is cooperative.  Non-toxic appearance. She does not appear ill. No distress.  HENT:  Head: Normocephalic.  Right Ear: Hearing, tympanic membrane, external ear and ear canal normal. Tympanic membrane is not erythematous, not retracted and not bulging.  Left Ear: Hearing, tympanic membrane, external ear and ear canal normal. Tympanic membrane is not erythematous, not retracted and not bulging.  Nose: No mucosal edema or rhinorrhea. Right sinus exhibits no maxillary sinus tenderness and no frontal sinus tenderness. Left sinus exhibits no maxillary sinus tenderness and no frontal sinus tenderness.  Mouth/Throat: Uvula is midline, oropharynx is clear and moist and mucous membranes are normal.  Eyes: Conjunctivae, EOM and lids are normal. Pupils are equal, round, and reactive to light. Lids are everted and swept, no foreign bodies found.  Neck: Trachea normal and normal range of motion. Neck  supple. Carotid bruit is not present. No thyroid mass and no thyromegaly present.  Cardiovascular: Normal rate, regular rhythm, S1 normal, S2 normal, normal heart sounds, intact distal pulses and normal pulses.  Exam reveals no gallop and no friction rub.   No murmur heard. Pulmonary/Chest: Effort normal and breath sounds normal. No tachypnea. No respiratory distress. She has no decreased breath sounds. She has no wheezes. She has no rhonchi. She has no rales.  Abdominal: Soft. Normal appearance and bowel sounds are normal. There is no tenderness.  Musculoskeletal:   Tight medial ankle with decreased redness and warmth < 4 cm diameter   Neurological: She is alert.  Skin: Skin is warm, dry and intact. No rash noted.  Psychiatric: Her speech is normal and behavior is normal. Judgment and thought content normal. Her mood appears not anxious. Cognition and memory are normal. She does not exhibit a depressed mood.          Assessment & Plan:

## 2016-08-19 NOTE — Patient Instructions (Signed)
Complete antibiotic course.  Once completed fill diflucan.  Limit weight bearing as much as possible, gent;le stretching and ice on ankle.  Can try a dose of prednisone to see if improved in setting of possible pseudogout.

## 2016-08-25 ENCOUNTER — Telehealth: Payer: Self-pay | Admitting: Family Medicine

## 2016-08-25 NOTE — Telephone Encounter (Signed)
Jane Li notified as instructed by telephone. She has two more days of antibiotics.  She will complete those and touch base with us next week if she feels like she needs to be re-evaluated.  She states the ankle definitely looks/feels better.

## 2016-08-25 NOTE — Telephone Encounter (Addendum)
Spoke with Jane Li.  She states she thinks her ankle issue may be coming from a pedicure she had the Sunday before all this started.  She is thinking she could have gotten this from the foot bath.  She has been looking on the Internet and apparently this is very common to get from the foot bath. She is concerned that maybe they knicked her skin while using the file on her feet. She states what they talked about on the Internet  described exactly what she is going through.  She said they called it cellulitis.  She states this past Sunday she had really bad chills and on Monday she ran a fever, was very fatigued, sweating and had body aches.  She states she slept most of Monday.  She state this week the area is really itchy.  She states the medications that Dr. Ermalene SearingBedsole has given her has helped but wanted to make sure she didn't need to be an a different antibiotic.  She doesn't want the infection to turn into sepsis.   Please advise.

## 2016-08-25 NOTE — Telephone Encounter (Signed)
I have been treating her for cellulitis with the antibiotics. If she does not feel it is resolved and is having systemic symptoms she needs to return for re-evaluation.

## 2016-08-25 NOTE — Telephone Encounter (Signed)
Please call patient concerning the soft tissue infection she has been dealing with.

## 2016-09-02 ENCOUNTER — Ambulatory Visit (INDEPENDENT_AMBULATORY_CARE_PROVIDER_SITE_OTHER): Payer: BC Managed Care – PPO | Admitting: Family Medicine

## 2016-09-02 ENCOUNTER — Encounter: Payer: Self-pay | Admitting: Family Medicine

## 2016-09-02 VITALS — BP 90/64 | HR 107 | Temp 98.7°F | Ht 62.0 in | Wt 211.5 lb

## 2016-09-02 DIAGNOSIS — M25571 Pain in right ankle and joints of right foot: Secondary | ICD-10-CM

## 2016-09-02 DIAGNOSIS — M79604 Pain in right leg: Secondary | ICD-10-CM

## 2016-09-02 DIAGNOSIS — R509 Fever, unspecified: Secondary | ICD-10-CM | POA: Insufficient documentation

## 2016-09-02 LAB — CBC WITH DIFFERENTIAL/PLATELET
BASOS PCT: 1 %
Basophils Absolute: 121 cells/uL (ref 0–200)
Eosinophils Absolute: 121 cells/uL (ref 15–500)
Eosinophils Relative: 1 %
HCT: 38.1 % (ref 35.0–45.0)
Hemoglobin: 12.7 g/dL (ref 11.7–15.5)
LYMPHS PCT: 25 %
Lymphs Abs: 3025 cells/uL (ref 850–3900)
MCH: 29.3 pg (ref 27.0–33.0)
MCHC: 33.3 g/dL (ref 32.0–36.0)
MCV: 87.8 fL (ref 80.0–100.0)
MONO ABS: 726 {cells}/uL (ref 200–950)
MONOS PCT: 6 %
MPV: 9.5 fL (ref 7.5–12.5)
Neutro Abs: 8107 cells/uL — ABNORMAL HIGH (ref 1500–7800)
Neutrophils Relative %: 67 %
PLATELETS: 213 10*3/uL (ref 140–400)
RBC: 4.34 MIL/uL (ref 3.80–5.10)
RDW: 15.4 % — AB (ref 11.0–15.0)
WBC: 12.1 10*3/uL — AB (ref 3.8–10.8)

## 2016-09-02 MED ORDER — CEFTRIAXONE SODIUM 1 G IJ SOLR
1.0000 g | Freq: Once | INTRAMUSCULAR | Status: AC
  Administered 2016-09-02: 1 g via INTRAMUSCULAR

## 2016-09-02 NOTE — Progress Notes (Signed)
Subjective:    Patient ID: Jane Li, female    DOB: 08-01-1970, 46 y.o.   MRN: [id number]  HPI  46 year old female presents for follow up  presumed cellulitis in right  Ankle causing right ankle pain.  Complete augmentin and doxycyline.  Tried prednisone taper... Minimal improvement.   Today she reports  She completed doxycycline  On 5/20.Marland Kitchen  Despite being on doxy.. She had chill spells, sweating. Pain resolved in ankle when on antibiotics.   When completed antibiotics she noted temp 100-101 F.. Last elevated temp yesterday.  She has been very tired, nauseated.  Pain increase yesterday around knee and hip and groin.  Noted this morning red patch on back of right thigh, tender, not itchy. Still with discoloration in right ankle, ankle is itchy.  Right ankle pain.  Some pain with pressure on right leg.  No there symptoms such as face pain, no cough, no congestion. No dysuria. No vaginal discharge or burning.   Took ibuprofen at lunchtime.  Pulse at last few OV 99-119  Blood pressure 90/64, pulse (!) 107, temperature 98.7 F (37.1 C), temperature source Oral, height 5\' 2"  (1.575 m), weight 211 lb 8 oz (95.9 kg).    Review of Systems  Constitutional: Positive for fatigue. Negative for fever.  HENT: Negative for ear pain.   Eyes: Negative for pain.  Respiratory: Negative for chest tightness and shortness of breath.   Cardiovascular: Negative for chest pain, palpitations and leg swelling.  Gastrointestinal: Negative for abdominal pain.  Genitourinary: Negative for dysuria.       Objective:   Physical Exam  Constitutional: Vital signs are normal. She appears well-developed and well-nourished. She is cooperative.  Non-toxic appearance. She does not appear ill. No distress.  Nontoxic appearing female in NAD  HENT:  Head: Normocephalic.  Right Ear: Hearing, tympanic membrane, external ear and ear canal normal. Tympanic membrane is not erythematous, not retracted and not  bulging.  Left Ear: Hearing, tympanic membrane, external ear and ear canal normal. Tympanic membrane is not erythematous, not retracted and not bulging.  Nose: No mucosal edema or rhinorrhea. Right sinus exhibits no maxillary sinus tenderness and no frontal sinus tenderness. Left sinus exhibits no maxillary sinus tenderness and no frontal sinus tenderness.  Mouth/Throat: Uvula is midline, oropharynx is clear and moist and mucous membranes are normal.  Eyes: Conjunctivae, EOM and lids are normal. Pupils are equal, round, and reactive to light. Lids are everted and swept, no foreign bodies found.  Neck: Trachea normal and normal range of motion. Neck supple. Carotid bruit is not present. No thyroid mass and no thyromegaly present.  Cardiovascular: Normal rate, regular rhythm, S1 normal, S2 normal, normal heart sounds, intact distal pulses and normal pulses.  Exam reveals no gallop and no friction rub.   No murmur heard. Pulmonary/Chest: Effort normal and breath sounds normal. No tachypnea. No respiratory distress. She has no decreased breath sounds. She has no wheezes. She has no rhonchi. She has no rales.  Abdominal: Soft. Normal appearance and bowel sounds are normal. There is no tenderness.  Musculoskeletal:  ttp over right groin, lateral hip, medial right ankle no peripheral edma, pulse nml bilaterally  Neurological: She is alert.  Skin: Skin is warm, dry and intact. No rash noted.  Erythematous patch on right posterior thigh, no warmth   right medial ankle purplish, no warmth, no streaking.  Psychiatric: Her speech is normal and behavior is normal. Judgment and thought content normal. Her mood appears  not anxious. Cognition and memory are normal. She does not exhibit a depressed mood.          Assessment & Plan:   fever at home, right ankle and leg pain.  Pt nontoxic, but given continue fever and pain after antibiotics... Send for repeat cbc and blood cultures.  Will treat with  ceftriaxone x 1 inj to cover until labs return.  If not clearly infection... Consider ortho issue such as pseudogout... Consider referral to ortho.  Dpoes not appera consistent with septic arthritis.   No clear sign of DVT.

## 2016-09-02 NOTE — Patient Instructions (Addendum)
Please stop at the lab to set up to have labs drawn.  Call if fever after ceftriaxone injection today.. I will call with labs as soon as they return.

## 2016-09-05 ENCOUNTER — Encounter (HOSPITAL_COMMUNITY): Payer: Self-pay | Admitting: *Deleted

## 2016-09-05 ENCOUNTER — Emergency Department (HOSPITAL_COMMUNITY): Payer: BC Managed Care – PPO

## 2016-09-05 ENCOUNTER — Emergency Department (HOSPITAL_COMMUNITY)
Admit: 2016-09-05 | Discharge: 2016-09-05 | Disposition: A | Discharge status: Home / Self Care | Payer: BC Managed Care – PPO | Attending: Emergency Medicine | Admitting: Emergency Medicine

## 2016-09-05 ENCOUNTER — Inpatient Hospital Stay (HOSPITAL_COMMUNITY)
Admission: EM | Admit: 2016-09-05 | Discharge: 2016-09-09 | DRG: 872 | Disposition: A | Discharge status: Home / Self Care | Payer: BC Managed Care – PPO | Attending: Internal Medicine | Admitting: Internal Medicine

## 2016-09-05 DIAGNOSIS — Z5181 Encounter for therapeutic drug level monitoring: Secondary | ICD-10-CM | POA: Diagnosis not present

## 2016-09-05 DIAGNOSIS — Z6837 Body mass index (BMI) 37.0-37.9, adult: Secondary | ICD-10-CM

## 2016-09-05 DIAGNOSIS — E66812 Obesity, class 2: Secondary | ICD-10-CM

## 2016-09-05 DIAGNOSIS — G43909 Migraine, unspecified, not intractable, without status migrainosus: Secondary | ICD-10-CM | POA: Diagnosis present

## 2016-09-05 DIAGNOSIS — I10 Essential (primary) hypertension: Secondary | ICD-10-CM | POA: Diagnosis present

## 2016-09-05 DIAGNOSIS — E669 Obesity, unspecified: Secondary | ICD-10-CM | POA: Diagnosis present

## 2016-09-05 DIAGNOSIS — Z452 Encounter for adjustment and management of vascular access device: Secondary | ICD-10-CM | POA: Diagnosis not present

## 2016-09-05 DIAGNOSIS — B952 Enterococcus as the cause of diseases classified elsewhere: Secondary | ICD-10-CM | POA: Diagnosis present

## 2016-09-05 DIAGNOSIS — Z87898 Personal history of other specified conditions: Secondary | ICD-10-CM | POA: Diagnosis present

## 2016-09-05 DIAGNOSIS — E785 Hyperlipidemia, unspecified: Secondary | ICD-10-CM | POA: Diagnosis present

## 2016-09-05 DIAGNOSIS — Z6839 Body mass index (BMI) 39.0-39.9, adult: Secondary | ICD-10-CM

## 2016-09-05 DIAGNOSIS — M7989 Other specified soft tissue disorders: Secondary | ICD-10-CM | POA: Diagnosis not present

## 2016-09-05 DIAGNOSIS — K219 Gastro-esophageal reflux disease without esophagitis: Secondary | ICD-10-CM | POA: Diagnosis present

## 2016-09-05 DIAGNOSIS — R52 Pain, unspecified: Secondary | ICD-10-CM

## 2016-09-05 DIAGNOSIS — M79609 Pain in unspecified limb: Secondary | ICD-10-CM

## 2016-09-05 DIAGNOSIS — R7881 Bacteremia: Secondary | ICD-10-CM | POA: Diagnosis present

## 2016-09-05 DIAGNOSIS — F411 Generalized anxiety disorder: Secondary | ICD-10-CM | POA: Diagnosis present

## 2016-09-05 DIAGNOSIS — I340 Nonrheumatic mitral (valve) insufficiency: Secondary | ICD-10-CM | POA: Diagnosis not present

## 2016-09-05 DIAGNOSIS — G43009 Migraine without aura, not intractable, without status migrainosus: Secondary | ICD-10-CM | POA: Diagnosis present

## 2016-09-05 DIAGNOSIS — A4181 Sepsis due to Enterococcus: Secondary | ICD-10-CM | POA: Diagnosis not present

## 2016-09-05 LAB — COMPREHENSIVE METABOLIC PANEL
ALK PHOS: 102 U/L (ref 38–126)
ALT: 21 U/L (ref 14–54)
AST: 21 U/L (ref 15–41)
Albumin: 3.3 g/dL — ABNORMAL LOW (ref 3.5–5.0)
Anion gap: 10 (ref 5–15)
BUN: 15 mg/dL (ref 6–20)
CALCIUM: 9.5 mg/dL (ref 8.9–10.3)
CO2: 27 mmol/L (ref 22–32)
CREATININE: 0.76 mg/dL (ref 0.44–1.00)
Chloride: 99 mmol/L — ABNORMAL LOW (ref 101–111)
Glucose, Bld: 90 mg/dL (ref 65–99)
Potassium: 4.5 mmol/L (ref 3.5–5.1)
Sodium: 136 mmol/L (ref 135–145)
Total Bilirubin: 0.5 mg/dL (ref 0.3–1.2)
Total Protein: 7.5 g/dL (ref 6.5–8.1)

## 2016-09-05 LAB — CBC WITH DIFFERENTIAL/PLATELET
Basophils Absolute: 0 10*3/uL (ref 0.0–0.1)
Basophils Relative: 0 %
EOS ABS: 0.1 10*3/uL (ref 0.0–0.7)
EOS PCT: 1 %
HCT: 39.2 % (ref 36.0–46.0)
Hemoglobin: 12.9 g/dL (ref 12.0–15.0)
LYMPHS ABS: 2.2 10*3/uL (ref 0.7–4.0)
Lymphocytes Relative: 23 %
MCH: 29.2 pg (ref 26.0–34.0)
MCHC: 32.9 g/dL (ref 30.0–36.0)
MCV: 88.7 fL (ref 78.0–100.0)
MONO ABS: 0.5 10*3/uL (ref 0.1–1.0)
Monocytes Relative: 5 %
Neutro Abs: 6.9 10*3/uL (ref 1.7–7.7)
Neutrophils Relative %: 71 %
PLATELETS: 259 10*3/uL (ref 150–400)
RBC: 4.42 MIL/uL (ref 3.87–5.11)
RDW: 14.8 % (ref 11.5–15.5)
WBC: 9.8 10*3/uL (ref 4.0–10.5)

## 2016-09-05 LAB — URINALYSIS, ROUTINE W REFLEX MICROSCOPIC
BILIRUBIN URINE: NEGATIVE
Glucose, UA: NEGATIVE mg/dL
HGB URINE DIPSTICK: NEGATIVE
KETONES UR: NEGATIVE mg/dL
Leukocytes, UA: NEGATIVE
Nitrite: NEGATIVE
PROTEIN: NEGATIVE mg/dL
Specific Gravity, Urine: 1.019 (ref 1.005–1.030)
pH: 6 (ref 5.0–8.0)

## 2016-09-05 LAB — CULTURE, BLOOD (SINGLE)

## 2016-09-05 LAB — I-STAT CG4 LACTIC ACID, ED
LACTIC ACID, VENOUS: 0.45 mmol/L — AB (ref 0.5–1.9)
Lactic Acid, Venous: 1.3 mmol/L (ref 0.5–1.9)

## 2016-09-05 LAB — PROTIME-INR
INR: 1.05
PROTHROMBIN TIME: 13.7 s (ref 11.4–15.2)

## 2016-09-05 MED ORDER — HYDROCHLOROTHIAZIDE 12.5 MG PO CAPS
12.5000 mg | ORAL_CAPSULE | Freq: Every day | ORAL | Status: DC
  Administered 2016-09-06 – 2016-09-09 (×4): 12.5 mg via ORAL
  Filled 2016-09-05 (×4): qty 1

## 2016-09-05 MED ORDER — TRIAMCINOLONE ACETONIDE 55 MCG/ACT NA AERO
2.0000 | INHALATION_SPRAY | Freq: Every evening | NASAL | Status: DC
  Administered 2016-09-05 – 2016-09-08 (×3): 2 via NASAL
  Filled 2016-09-05: qty 21.6

## 2016-09-05 MED ORDER — PANTOPRAZOLE SODIUM 40 MG PO TBEC
40.0000 mg | DELAYED_RELEASE_TABLET | Freq: Every day | ORAL | Status: DC
  Administered 2016-09-06 – 2016-09-09 (×4): 40 mg via ORAL
  Filled 2016-09-05 (×4): qty 1

## 2016-09-05 MED ORDER — AMPICILLIN SODIUM 2 G IJ SOLR
2.0000 g | INTRAMUSCULAR | Status: DC
  Administered 2016-09-05 – 2016-09-09 (×24): 2 g via INTRAVENOUS
  Filled 2016-09-05 (×28): qty 2000

## 2016-09-05 MED ORDER — ACETAMINOPHEN 650 MG RE SUPP
650.0000 mg | Freq: Four times a day (QID) | RECTAL | Status: DC | PRN
Start: 2016-09-05 — End: 2016-09-09

## 2016-09-05 MED ORDER — IBUPROFEN 400 MG PO TABS
400.0000 mg | ORAL_TABLET | Freq: Four times a day (QID) | ORAL | Status: DC | PRN
  Administered 2016-09-06 – 2016-09-09 (×7): 400 mg via ORAL
  Filled 2016-09-05 (×7): qty 1

## 2016-09-05 MED ORDER — SODIUM CHLORIDE 0.9 % IV BOLUS (SEPSIS)
1000.0000 mL | Freq: Once | INTRAVENOUS | Status: AC
  Administered 2016-09-05: 1000 mL via INTRAVENOUS

## 2016-09-05 MED ORDER — ACETAMINOPHEN 325 MG PO TABS
650.0000 mg | ORAL_TABLET | Freq: Four times a day (QID) | ORAL | Status: DC | PRN
  Administered 2016-09-06: 650 mg via ORAL
  Filled 2016-09-05: qty 2

## 2016-09-05 MED ORDER — LISINOPRIL-HYDROCHLOROTHIAZIDE 20-12.5 MG PO TABS: 1.0000 | ORAL_TABLET | Freq: Every day | ORAL | Status: DC

## 2016-09-05 MED ORDER — VENLAFAXINE HCL ER 75 MG PO CP24
75.0000 mg | ORAL_CAPSULE | Freq: Every day | ORAL | Status: DC
  Administered 2016-09-05 – 2016-09-08 (×4): 75 mg via ORAL
  Filled 2016-09-05 (×4): qty 1

## 2016-09-05 MED ORDER — PIPERACILLIN-TAZOBACTAM 3.375 G IVPB 30 MIN: 3.3750 g | Freq: Once | INTRAVENOUS | Status: DC

## 2016-09-05 MED ORDER — LISINOPRIL 20 MG PO TABS
20.0000 mg | ORAL_TABLET | Freq: Every day | ORAL | Status: DC
  Administered 2016-09-06 – 2016-09-09 (×4): 20 mg via ORAL
  Filled 2016-09-05 (×5): qty 1

## 2016-09-05 MED ORDER — ENOXAPARIN SODIUM 40 MG/0.4ML ~~LOC~~ SOLN
40.0000 mg | SUBCUTANEOUS | Status: DC
  Filled 2016-09-05: qty 0.4

## 2016-09-05 NOTE — ED Triage Notes (Signed)
Pt sent here by her pcp for continued cellulitis to R leg, despite antibiotic treatment.  Hr 125 and hypotensive, though pt does not appear in great distress.

## 2016-09-05 NOTE — Progress Notes (Signed)
VASCULAR LAB PRELIMINARY  PRELIMINARY  PRELIMINARY  PRELIMINARY  Right lower extremity venous duplex completed.    Preliminary report:  Right:  No evidence of DVT, superficial thrombosis, or Baker's cyst.  Jane Li, RVS 09/05/2016, 6:03 PM

## 2016-09-05 NOTE — ED Notes (Signed)
Patient returned from US and placed back on monitor.

## 2016-09-05 NOTE — ED Provider Notes (Signed)
[facility]-EMERGENCY DEPT Provider Note   CSN: [id number] Arrival date & time: 09/05/16  1405     History   Chief Complaint Chief Complaint  Patient presents with  . abnormal labs    HPI Jane Li is a 46 y.o. female.  HPI Patient is a 46 year old female has been dealing with a right medial ankle infection over the past 3 weeks.  She was initially on amoxicillin followed by course of doxycycline.  She's continued feeling weak and had nausea without urinary symptoms.  She was seen in the primary care office 3 days ago and blood cultures were obtained.  She was given an IM dose of Rocephin.  Blood cultures returned positive demonstrating of vancomycin and ampicillin sensitive enterococcus.  Patient told to come to the emergency department for further evaluation.  Patient reports that the area of redness on her right medial ankles much better but she is having ongoing pain up her right leg including her right thigh and right medial thigh.  No significant swelling.  No history DVT.  She's continued having  fevers at home including fever possible 101-102 last night.  No sore throat.  No abdominal pain.  No chest pain shortness of breath.  No cough.  No recent dental procedures.     Past Medical History:  Diagnosis Date  . Allergy   . Chronic headaches    2/WEEK SINUS AND STRESS HEADACHES  . Deviated nasal septum   . GERD (gastroesophageal reflux disease)   . Hyperlipidemia   . Hypertension    CONTROLLED ON MEDS  . Motion sickness    CAR, AMUSEMENT PARK  . Nasal turbinate hypertrophy    NASAL OBSTRUCTION  . Obesity   . Shortness of breath dyspnea    ON EXERTION  . Sinusitis, chronic     Patient Active Problem List   Diagnosis Date Noted  . Fever 09/02/2016  . Right leg pain 09/02/2016  . Ankle pain 08/10/2016  . Upper respiratory infection 06/20/2016  . GAD (generalized anxiety disorder) 02/19/2016  . Abdominal pain, epigastric 10/06/2015  . Fatigue 12/19/2014  .  Decreased libido 12/19/2014  . Sequelae of injury of head 12/19/2014  . Neck pain 09/05/2014  . Helicobacter pylori gastritis 11/12/2013  . Vagina, candidiasis 11/12/2013  . High cholesterol 02/01/2013  . Elevated LFTs 06/07/2012  . Atypical chest pain 06/07/2012  . HTN (hypertension) 04/13/2012  . GERD 07/30/2009  . BMI 37.0-37.9, adult 05/11/2007  . Migraine headache without aura 05/03/2007  . Allergic rhinitis 05/03/2007  . FATIGUE, CHRONIC 08/15/2006    Past Surgical History:  Procedure Laterality Date  . CESAREAN SECTION  2002  . COLONOSCOPY    . FRONTAL SINUS EXPLORATION Bilateral 10/08/2015   Procedure: FRONTAL SINUS EXPLORATION BILATERAL;  Surgeon: Vernie MurdersPaul Juengel, MD;  [facility];  Service: ENT;  Laterality: Bilateral;  . IMAGE GUIDED SINUS SURGERY N/A 10/08/2015   Procedure: IMAGE GUIDED SINUS SURGERY;  Surgeon: Vernie MurdersPaul Juengel, MD;  [facility];  Service: ENT;  Laterality: N/A;  GAVE DISK TO CECE 5/24 DEE UPREG  . MAXILLARY ANTROSTOMY Bilateral 10/08/2015   Procedure: MAXILLARY ANTROSTOMY BILATERAL;  Surgeon: Vernie MurdersPaul Juengel, MD;  [facility];  Service: ENT;  Laterality: Bilateral;  . SEPTOPLASTY N/A 10/08/2015   Procedure: SEPTOPLASTY;  Surgeon: Vernie MurdersPaul Juengel, MD;  [facility];  Service: ENT;  Laterality: N/A;  . TURBINATE REDUCTION Bilateral 10/08/2015   Procedure: TURBINATE REDUCTION BILATERAL;  Surgeon: Vernie MurdersPaul Juengel, MD;  [facility];  Service: ENT;  Laterality: Bilateral;  . UPPER GASTROINTESTINAL ENDOSCOPY  03-18-2014  . WISDOM TOOTH EXTRACTION      OB History    No data available       Home Medications    Prior to Admission medications   Medication Sig Start Date End Date Taking? Authorizing Provider  calcium carbonate (TUMS - DOSED IN MG ELEMENTAL CALCIUM) 500 MG chewable tablet Chew 1-2 tablets by mouth every 6 (six) hours as needed for indigestion or heartburn.    Yes [provider]  ibuprofen (ADVIL,MOTRIN) 200 MG tablet Take 600 mg by mouth every 6 (six) hours as needed for headache or mild pain.    Yes [provider]  lisinopril-hydrochlorothiazide (PRINZIDE,ZESTORETIC) 20-12.5 MG tablet TAKE 1 TABLET BY MOUTH DAILY. 06/07/16  Yes Excell Seltzer, MD  NON FORMULARY On Guard Beadlets: (Proprietary blend of [place], [place], [place], [place], and [place] contained in tiny vegetable beadlets) Take 2-3 beadlets by mouth once daily   Yes [provider]  NON FORMULARY Essential Oil(s) Lemon Oil Capsules: Take 1 capsule by mouth one to two times a day   Yes [provider]  NON FORMULARY Essential Oil(s) Oregano Capsules: Take 1 capsule by mouth one to two times a day   Yes [provider]  NON FORMULARY Essential Oil(s) Grapefruit Capsules: Take 1 capsule by mouth one to two times a day   Yes [provider]  nystatin-triamcinolone (MYCOLOG II) cream Apply to the affected areas three times a day as needed for irritation 06/20/16  Yes [provider]  omeprazole (PRILOSEC) 40 MG capsule TAKE 1 CAPSULE (40 MG TOTAL) BY MOUTH DAILY. 08/02/16  Yes Bedsole, Amy E, MD  simethicone (GAS-X) 80 MG chewable tablet Chew 80 mg by mouth as needed for flatulence.    Yes [provider]  SUMAtriptan (IMITREX) 100 MG tablet 1 tab po as needed for migraine. May repeat in 2 hours if headache persists or recurs. Patient taking differently: Take 100 mg by mouth once as needed for migraine. And may repeat once in 2 hours if headache persists or recurs 05/05/16  Yes Bedsole, Amy E, MD  triamcinolone (NASACORT ALLERGY 24HR) 55 MCG/ACT AERO nasal inhaler Place 2 sprays into the nose every evening.    Yes [provider]  venlafaxine XR (EFFEXOR-XR) 75 MG 24 hr capsule Take 75 mg by mouth at bedtime.  07/13/16  Yes [provider]    Family History Family History  Problem Relation Age of Onset  . Depression  Mother   . Mental illness Mother   . Hypertension Mother   . Arthritis Father   . Thyroid disease Father   . Mental illness Sister   . Breast cancer Maternal Grandmother   . Diabetes Paternal Grandmother   . Colon cancer Neg Hx   . Stomach cancer Neg Hx     Social History Social History  Substance Use Topics  . Smoking status: Never Smoker  . Smokeless tobacco: Never Used  . Alcohol use 0.0 oz/week     Comment: rare     Allergies   Codeine   Review of Systems Review of Systems  All other systems reviewed and are negative.    Physical Exam Updated Vital Signs BP 94/68 (BP Location: Left Arm)   Pulse (!) 103   Temp 98.5 F (36.9 C) (Oral)   Resp 17   SpO2 100%   Physical Exam  Constitutional: She is oriented  to person, place, and time. She appears well-developed and well-nourished. No distress.  HENT:  Head: Normocephalic and atraumatic.  Eyes: EOM are normal.  Neck: Normal range of motion.  Cardiovascular: Regular rhythm and normal heart sounds.   Tachycardia  Pulmonary/Chest: Effort normal and breath sounds normal.  Abdominal: Soft. She exhibits no distension. There is no tenderness.  Musculoskeletal:  Full range of motion of right ankle, right knee, right hip.  No joint swelling or erythema or warmth.  Normal PT and DP pulse in right foot.  No swelling of the right lower extremity as compared to left.  Chronic discoloration of the right medial ankle without erythema or warmth  Neurological: She is alert and oriented to person, place, and time.  Skin: Skin is warm and dry.  Psychiatric: She has a normal mood and affect. Judgment normal.  Nursing note and vitals reviewed.    ED Treatments / Results  Labs (all labs ordered are listed, but only abnormal results are displayed) Labs Reviewed  COMPREHENSIVE METABOLIC PANEL - Abnormal; Notable for the following:       Result Value   Chloride 99 (*)    Albumin 3.3 (*)    All other components within normal  limits  CULTURE, BLOOD (ROUTINE X 2)  CULTURE, BLOOD (ROUTINE X 2)  CBC WITH DIFFERENTIAL/PLATELET  URINALYSIS, ROUTINE W REFLEX MICROSCOPIC  PROTIME-INR  I-STAT CG4 LACTIC ACID, ED    EKG  EKG Interpretation None       Radiology Dg Chest 2 View  Result Date: 09/05/2016 CLINICAL DATA:  Sepsis, bacteremia. EXAM: CHEST  2 VIEW COMPARISON:  Radiograph and CT scan of May 05, 2016. FINDINGS: The heart size and mediastinal contours are within normal limits. Both lungs are clear. No pneumothorax or pleural effusion is noted. The visualized skeletal structures are unremarkable. IMPRESSION: No active cardiopulmonary disease. Electronically Signed   By: Lupita Raider, M.D.   On: 09/05/2016 16:31    Procedures Procedures (including critical care time)  Medications Ordered in ED Medications  sodium chloride 0.9 % bolus 1,000 mL (1,000 mLs Intravenous New Bag/Given 09/05/16 1607)    And  sodium chloride 0.9 % bolus 1,000 mL (not administered)    And  sodium chloride 0.9 % bolus 1,000 mL (not administered)  ampicillin (OMNIPEN) 2 g in sodium chloride 0.9 % 50 mL IVPB (2 g Intravenous New Bag/Given 09/05/16 1642)     Initial Impression / Assessment and Plan / ED Course  I have reviewed the triage vital signs and the nursing notes.  Pertinent labs & imaging results that were available during my care of the patient were reviewed by me and considered in my medical decision making (see chart for details).     Patient with enterococcus bacteremia and ongoing fevers and symptoms at home.  Patient has been started on IV ampicillin.  Repeat blood cultures obtained.  Tachycardic and hypotensive on arrival.  30 cc/kg bolus given.  Patient be reevaluated.  Patient be admitted the hospital for ongoing management of her bacteremia  Final Clinical Impressions(s) / ED Diagnoses   Final diagnoses:  Bacteremia  Bacteremia due to Enterococcus    New Prescriptions New Prescriptions   No  medications on file     Azalia Bilis, MD 09/05/16 1656

## 2016-09-05 NOTE — ED Notes (Signed)
Pt ambulated to bed in hallway, pt had no complaints while ambulating.

## 2016-09-05 NOTE — H&P (Signed)
History and Physical    JALEENA VIVIANI BJY:[id number] DOB: 04/07/1971 DOA: 09/05/2016  I have briefly reviewed the patient's prior medical records in [facility]  PCP: Excell Seltzer, MD  Patient coming from: Home  Chief Complaint: Bacteremia  HPI: Jane Li is a 46 y.o. female with medical history significant of migraine HA, obesity,, hypertension, hyperlipidemia, who presents to the hospital directed by her PCP after blood cultures obtained in office a few days ago speciated enterococcus.  Patient has been having right ankle pain and redness for about 4 weeks.  She was initially diagnosed with a sprain, however failed conservative measures and skin overlying the ankle started getting red, she was diagnosed with cellulitis and placed on amoxicillin.  That helped with the pain and redness, however once she finished a course she started feeling bad again.  She was subsequently placed in a course of doxycycline which again helped.  She finished the doxycycline few days ago.  She has been noticed increased pain with ambulation in her right ankle.  Did not notice any new redness on the overlying skin.  She also has been complaining of generalized malaise, fatigue as well as intermittent chills at home.  3 days ago on 5/25 she underwent blood cultures and outpatient, and speciated enterococcus today and she was directed to come to the hospital for IV antibiotics.  She denies any chest pain, shortness of breath.  She has no abdominal pain.  She has been complaining of nausea for the past 3 months.  She denies any diarrhea/constipation.  ED Course: In the emergency room, her vital signs are stable, she is afebrile, she was however tachycardic into the 120s on arrival which improved after fluids.  Her blood work is stable, she has normal renal function, and no white count.  Her lactic acid is normal at 1.3.  She was given ampicillin IV, blood cultures were repeated and [facility] was asked for  admission  Review of Systems: As per HPI otherwise 10 point review of systems negative.   Past Medical History:  Diagnosis Date  . Allergy   . Chronic headaches    2/WEEK SINUS AND STRESS HEADACHES  . Deviated nasal septum   . GERD (gastroesophageal reflux disease)   . Hyperlipidemia   . Hypertension    CONTROLLED ON MEDS  . Motion sickness    CAR, AMUSEMENT PARK  . Nasal turbinate hypertrophy    NASAL OBSTRUCTION  . Obesity   . Shortness of breath dyspnea    ON EXERTION  . Sinusitis, chronic     Past Surgical History:  Procedure Laterality Date  . CESAREAN SECTION  2002  . COLONOSCOPY    . FRONTAL SINUS EXPLORATION Bilateral 10/08/2015   Procedure: FRONTAL SINUS EXPLORATION BILATERAL;  Surgeon: Vernie Murders, MD;  [facility];  Service: ENT;  Laterality: Bilateral;  . IMAGE GUIDED SINUS SURGERY N/A 10/08/2015   Procedure: IMAGE GUIDED SINUS SURGERY;  Surgeon: Vernie Murders, MD;  [facility];  Service: ENT;  Laterality: N/A;  GAVE DISK TO CECE 5/24 DEE UPREG  . MAXILLARY ANTROSTOMY Bilateral 10/08/2015   Procedure: MAXILLARY ANTROSTOMY BILATERAL;  Surgeon: Vernie Murders, MD;  [facility]: [facility];  Service: ENT;  Laterality: Bilateral;  . SEPTOPLASTY N/A 10/08/2015   Procedure: SEPTOPLASTY;  Surgeon: Vernie Murders, MD;  Location: [facility];  Service: ENT;  Laterality: N/A;  . TURBINATE REDUCTION Bilateral 10/08/2015   Procedure: TURBINATE REDUCTION BILATERAL;  Surgeon: Vernie Murders, MD;  Location: [facility];  Service: ENT;  Laterality: Bilateral;  . UPPER GASTROINTESTINAL ENDOSCOPY  03-18-2014  . WISDOM TOOTH EXTRACTION       reports that she has never smoked. She has never used smokeless tobacco. She reports that she drinks alcohol. She reports that she does not use drugs.  Allergies  Allergen Reactions  . Codeine Nausea And Vomiting    Family History  Problem Relation Age of Onset  . Depression Mother   .  Mental illness Mother   . Hypertension Mother   . Arthritis Father   . Thyroid disease Father   . Mental illness Sister   . Breast cancer Maternal Grandmother   . Diabetes Paternal Grandmother   . Colon cancer Neg Hx   . Stomach cancer Neg Hx     Prior to Admission medications   Medication Sig Start Date End Date Taking? Authorizing Provider  calcium carbonate (TUMS - DOSED IN MG ELEMENTAL CALCIUM) 500 MG chewable tablet Chew 1-2 tablets by mouth every 6 (six) hours as needed for indigestion or heartburn.    Yes [provider]  ibuprofen (ADVIL,MOTRIN) 200 MG tablet Take 600 mg by mouth every 6 (six) hours as needed for headache or mild pain.    Yes [provider]  lisinopril-hydrochlorothiazide (PRINZIDE,ZESTORETIC) 20-12.5 MG tablet TAKE 1 TABLET BY MOUTH DAILY. 06/07/16  Yes Excell Seltzer, MD  NON FORMULARY On Guard Beadlets: (Proprietary blend of [place], [place], [place], [place], and [place] contained in tiny vegetable beadlets) Take 2-3 beadlets by mouth once daily   Yes [provider]  NON FORMULARY Essential Oil(s) Lemon Oil Capsules: Take 1 capsule by mouth one to two times a day   Yes [provider]  NON FORMULARY Essential Oil(s) Oregano Capsules: Take 1 capsule by mouth one to two times a day   Yes [provider]  NON FORMULARY Essential Oil(s) Grapefruit Capsules: Take 1 capsule by mouth one to two times a day   Yes [provider]  nystatin-triamcinolone (MYCOLOG II) cream Apply to the affected areas three times a day as needed for irritation 06/20/16  Yes [provider]  omeprazole (PRILOSEC) 40 MG capsule TAKE 1 CAPSULE (40 MG TOTAL) BY MOUTH DAILY. 08/02/16  Yes Bedsole, Amy E, MD  simethicone (GAS-X) 80 MG chewable tablet Chew 80 mg by mouth as needed for flatulence.    Yes [provider]  SUMAtriptan (IMITREX) 100 MG tablet 1 tab po as needed for migraine. May repeat in 2 hours if headache  persists or recurs. Patient taking differently: Take 100 mg by mouth once as needed for migraine. And may repeat once in 2 hours if headache persists or recurs 05/05/16  Yes Bedsole, Amy E, MD  triamcinolone (NASACORT ALLERGY 24HR) 55 MCG/ACT AERO nasal inhaler Place 2 sprays into the nose every evening.    Yes [provider]  venlafaxine XR (EFFEXOR-XR) 75 MG 24 hr capsule Take 75 mg by mouth at bedtime.  07/13/16  Yes [provider]    Physical Exam: Vitals:   09/05/16 1431 09/05/16 1645 09/05/16 1650 09/05/16 1700  BP: 94/68  113/72 115/78  Pulse: (!) 125 (!) 103 97   Resp: 16 17 15 20   Temp: 98.5 F (36.9 C)     TempSrc: Oral     SpO2: 97% 100% 99% 99%   Constitutional: NAD, calm, comfortable Eyes: PERRL, lids and conjunctivae normal ENMT: Mucous membranes are moist. Posterior pharynx clear of any exudate or  lesions. Neck: normal, supple, no masses Respiratory: clear to auscultation bilaterally, no wheezing, no crackles. Normal respiratory effort. No accessory muscle use.  Cardiovascular: Regular rate and rhythm, no murmurs / rubs / gallops. No extremity edema. 2+ pedal pulses.  Abdomen: no tenderness, no masses palpated. Bowel sounds positive.  Musculoskeletal: no clubbing / cyanosis. Normal muscle tone.  Tenderness to the medial aspect of the right ankle Skin: no rashes, lesions, ulcers. No induration Neurologic: CN 2-12 grossly intact. Strength 5/5 in all 4.  Psychiatric: Normal judgment and insight. Alert and oriented x 3. Normal mood.   Labs on Admission: I have personally reviewed following labs and imaging studies  CBC:  Recent Labs Lab 09/02/16 1643 09/05/16 1450  WBC 12.1* 9.8  NEUTROABS 8,107* 6.9  HGB 12.7 12.9  HCT 38.1 39.2  MCV 87.8 88.7  PLT 213 259   Basic Metabolic Panel:  Recent Labs Lab 09/05/16 1450  NA 136  K 4.5  CL 99*  CO2 27  GLUCOSE 90  BUN 15  CREATININE 0.76  CALCIUM 9.5   GFR: Estimated Creatinine Clearance:  92.9 mL/min (by C-G formula based on SCr of 0.76 mg/dL). Liver Function Tests:  Recent Labs Lab 09/05/16 1450  AST 21  ALT 21  ALKPHOS 102  BILITOT 0.5  PROT 7.5  ALBUMIN 3.3*   No results for input(s): LIPASE, AMYLASE in the last 168 hours. No results for input(s): AMMONIA in the last 168 hours. Coagulation Profile: No results for input(s): INR, PROTIME in the last 168 hours. Cardiac Enzymes: No results for input(s): CKTOTAL, CKMB, CKMBINDEX, TROPONINI in the last 168 hours. BNP (last 3 results) No results for input(s): PROBNP in the last 8760 hours. HbA1C: No results for input(s): HGBA1C in the last 72 hours. CBG: No results for input(s): GLUCAP in the last 168 hours. Lipid Profile: No results for input(s): CHOL, HDL, LDLCALC, TRIG, CHOLHDL, LDLDIRECT in the last 72 hours. Thyroid Function Tests: No results for input(s): TSH, T4TOTAL, FREET4, T3FREE, THYROIDAB in the last 72 hours. Anemia Panel: No results for input(s): VITAMINB12, FOLATE, FERRITIN, TIBC, IRON, RETICCTPCT in the last 72 hours. Urine analysis:    Component Value Date/Time   COLORURINE YELLOW 09/05/2016 1645   APPEARANCEUR CLEAR 09/05/2016 1645   LABSPEC 1.019 09/05/2016 1645   PHURINE 6.0 09/05/2016 1645   GLUCOSEU NEGATIVE 09/05/2016 1645   HGBUR NEGATIVE 09/05/2016 1645   BILIRUBINUR NEGATIVE 09/05/2016 1645   BILIRUBINUR negative 02/01/2016 1520   KETONESUR NEGATIVE 09/05/2016 1645   PROTEINUR NEGATIVE 09/05/2016 1645   UROBILINOGEN 0.2 02/01/2016 1520   NITRITE NEGATIVE 09/05/2016 1645   LEUKOCYTESUR NEGATIVE 09/05/2016 1645     Radiological Exams on Admission: Dg Chest 2 View  Result Date: 09/05/2016 CLINICAL DATA:  Sepsis, bacteremia. EXAM: CHEST  2 VIEW COMPARISON:  Radiograph and CT scan of May 05, 2016. FINDINGS: The heart size and mediastinal contours are within normal limits. Both lungs are clear. No pneumothorax or pleural effusion is noted. The visualized skeletal structures are  unremarkable. IMPRESSION: No active cardiopulmonary disease. Electronically Signed   By: Lupita Raider, M.D.   On: 09/05/2016 16:31    EKG: Independently reviewed.  Sinus tachycardia  Assessment/Plan Active Problems:   BMI 37.0-37.9, adult   Migraine headache without aura   GERD   HTN (hypertension)   Bacteremia due to Enterococcus   Enterococcus bacteremia -I am not that clear about the source, she did have intermittent right ankle swelling which improved after antibiotics and then got worse  once antibiotics were stopped.  She is quite tender on palpation, will obtain an MRI of the ankle to rule out septic joint -Discussed with infectious disease, they will see in consultation on 5/29 -Patient will need a TEE, message cardiology via epic -Continue ampicillin, blood cultures were obtained in the ED  Hypertension -Resume home medications  Migraine headaches -She is on Imitrex at home, has not been using in a while  GAD -Continue home Effexor   DVT prophylaxis: Lovenox  Code Status: Full code  Family Communication: no family at bedside Disposition Plan: admit to MedSurg Consults called: ID     Admission status: Inpatient    At the time of admission, it appears that the appropriate admission status for this patient is INPATIENT. This is judged to be reasonable and necessary in order to provide the required high service intensity to ensure the patient's safety given the presenting symptoms, physical exam findings, and initial radiographic and laboratory data in the context of their chronic comorbidities. Current circumstances are enterococcus bacteremia, need for clean surveillance blood cultures prior to discharge which is likely to take 48-72 hours at least, and it is felt to place patient at high risk for further clinical deterioration threatening life, limb, or organ. Moreover, it is my clinical judgment that the patient will require inpatient hospital care spanning beyond  2 midnights from the point of admission and that early discharge would result in unnecessary risk of decompensation and readmission or threat to life, limb or bodily function.   Pamella Pertostin Azoria Abbett, MD [facility] Pager [phone]  If 7PM-7AM, please contact night-coverage [email or web address] Password [email or web address]  09/05/2016, 5:34 PM

## 2016-09-06 ENCOUNTER — Telehealth: Payer: Self-pay

## 2016-09-06 LAB — COMPREHENSIVE METABOLIC PANEL
ALBUMIN: 2.6 g/dL — AB (ref 3.5–5.0)
ALK PHOS: 80 U/L (ref 38–126)
ALT: 18 U/L (ref 14–54)
ANION GAP: 8 (ref 5–15)
AST: 15 U/L (ref 15–41)
BUN: 9 mg/dL (ref 6–20)
CALCIUM: 8.6 mg/dL — AB (ref 8.9–10.3)
CO2: 26 mmol/L (ref 22–32)
Chloride: 104 mmol/L (ref 101–111)
Creatinine, Ser: 0.65 mg/dL (ref 0.44–1.00)
GFR calc non Af Amer: >60 mL/min (ref >60)
GLUCOSE: 100 mg/dL — AB (ref 65–99)
POTASSIUM: 3.9 mmol/L (ref 3.5–5.1)
SODIUM: 138 mmol/L (ref 135–145)
TOTAL PROTEIN: 5.7 g/dL — AB (ref 6.5–8.1)
Total Bilirubin: 0.5 mg/dL (ref 0.3–1.2)

## 2016-09-06 LAB — CBC
HCT: 32.9 % — ABNORMAL LOW (ref 36.0–46.0)
HEMOGLOBIN: 10.5 g/dL — AB (ref 12.0–15.0)
MCH: 28.4 pg (ref 26.0–34.0)
MCHC: 31.9 g/dL (ref 30.0–36.0)
MCV: 88.9 fL (ref 78.0–100.0)
Platelets: 254 10*3/uL (ref 150–400)
RBC: 3.7 MIL/uL — AB (ref 3.87–5.11)
RDW: 14.9 % (ref 11.5–15.5)
WBC: 7.1 10*3/uL (ref 4.0–10.5)

## 2016-09-06 MED ORDER — SODIUM CHLORIDE 0.9 % IV SOLN: INTRAVENOUS | Status: DC

## 2016-09-06 MED ORDER — SODIUM CHLORIDE 0.9 % IV SOLN
INTRAVENOUS | Status: DC
  Administered 2016-09-07 (×2): via INTRAVENOUS

## 2016-09-06 MED ORDER — ENOXAPARIN SODIUM 60 MG/0.6ML ~~LOC~~ SOLN
50.0000 mg | SUBCUTANEOUS | Status: DC
Start: 2016-09-07 — End: 2016-09-09
  Filled 2016-09-06 (×3): qty 0.6

## 2016-09-06 NOTE — Progress Notes (Signed)
[facility] will follow with ID team to support possible home IV ABX at DC.  If patient discharges after hours, please call [phone].   Sedalia Mutaamela S Chandler 09/06/2016, 9:33 PM

## 2016-09-06 NOTE — Telephone Encounter (Signed)
PLEASE NOTE: All timestamps contained within this report are represented as [place] Standard Time. CONFIDENTIALTY NOTICE: This fax transmission is intended only for the addressee. It contains information that is legally privileged, confidential or otherwise protected from use or disclosure. If you are not the intended recipient, you are strictly prohibited from reviewing, disclosing, copying using or disseminating any of this information or taking any action in reliance on or regarding this information. If you have received this fax in error, please notify us immediately by telephone so that we can arrange for its return to us. Phone: [phone], Toll-Free: [phone], Fax: [phone] Page: 1 of 1 Call Id: [id number] [facility] Client TELEPHONE ADVICE RECORD [facility] Patient Name: Jane Li Gender: Female DOB: 05/01/1970 Age: [age] Y 3 M 18 D Return Phone Number: City/State/Zip: [place] Stat[facility]Client [facility] [place] - Night Physician Kerby NoraBedsole, Amy - MD Who Is Calling Lab Lab Name [facility] Phone Number [phone] Lab Tech Name [facility] Reference Number none Chief Complaint Lab Result (Critical or Stat) Call Type Lab Send to RN Reason for Call Report lab results Initial Comment Liborio NixonJanice with [facility] has a stat lab . Additional Comment Nurse Assessment Nurse: Edmon Crapeipton, RN, Amy Date/Time Lamount Cohen(Eastern Time): 09/03/2016 8:51:06 PM Confirm and document reason for call. If symptomatic, describe symptoms. ---[facility]er is [facility]orio [facility]Janice with [facility], calling about 3 positive cultures - 2 anaerobic and 1 aerobic gram positive cocci in pairs, clusters and chains. Caller isn't sure if this is an outpatient or hospitalized patient. Please document clinical information provided and list any resource used. ---No triage. Called lab result to on call doctor. Nurse: Edmon Crapeipton, RN,  Amy Date/Time Lamount Cohen(Eastern Time): 09/03/2016 8:53:08 PM Is there an on-call provider listed? ---Yes Please list name of person reporting value (Lab Employee) and a contact number. ---Liborio NixonJanice with [facility] [phone] Please document the following items: Lab name Lab value (read back to lab to verify) Reference range for lab value Date and time blood was drawn Collect time of birth for bilirubin results ---3 positive blood cultures - 2 anaerobic and 1 aerobic - gram positive cocci in pairs, clusters and chains Drawn 09-02-16 at 4:43 pm Please collect the patient contact information from the lab. (name, phone number and address) ---Patient phone number is [phone] per lab Guidelines Guideline Title Affirmed Question Disp. Time Lamount Cohen(Eastern Time) Disposition Final User 09/03/2016 8:55:15 PM Clinical Call Yes Edmon Crapeipton, RN, Amy

## 2016-09-06 NOTE — Telephone Encounter (Signed)
Per chart snapshot [facility].

## 2016-09-06 NOTE — Progress Notes (Signed)
[facility] Progress Note  Patient: Jane Li YNW:[id number]   PCP: Jane SeltzerBedsole, Jane E, MD DOB: 12/16/1970   DOA: 09/05/2016   DOS: 09/06/2016   Date of Service: the patient was seen and examined on 09/06/2016  Subjective: Denies any acute complaint. Pain in the ankle is getting better. Redness is resolved. Her nausea no vomiting. No burning urination no diarrhea. No cough no fever no chills. Some headache this morning which is currently resolved as well.  Brief hospital course: Pt. with PMH of migraine, obesity, hypertension, dyslipidemia; admitted on 09/05/2016, presented with complaint of abnormal lab with cultures positive for enterococcus. Patient has been seeing his PCP for right ankle pain for about 4 weeks. Treated with Augmentin and ceftriaxone. Did not see any improvement and had blood cultures drawn which turn out positive for enterococcus 2 out of 2 and patient was referred for admission for further workup. Currently no symptoms. Currently further plan is further workup.  Assessment and Plan: Enterococcus bacteremia No clear source identified. she did have intermittent right ankle swelling which improved after antibiotics and then got worse once antibiotics were stopped.  - Discussed with infectious disease, appreciate input. - Patient will need a TEE, scheduled tomorrow. - MRI foot to rule out septic arthritis of the ankle. - Continue ampicillin, blood cultures were obtained in the ED  Hypertension -Resume home medications  Migraine headaches -She is on Imitrex at home, has not been using in a while  GAD -Continue home Effexor  Diet: cardiac diet DVT Prophylaxis: subcutaneous Heparin  Advance goals of care discussion: full code  Family Communication: family was present at bedside, at the time of interview. The pt provided permission to discuss medical plan with the family. Opportunity was given to ask question and all questions were answered satisfactorily.    Disposition:  Discharge to home.  Consultants: ID Procedures: TEE  Antibiotics: Anti-infectives    Start     Dose/Rate Route Frequency Ordered Stop   09/05/16 1630  ampicillin (OMNIPEN) 2 g in sodium chloride 0.9 % 50 mL IVPB     2 g 150 mL/hr over 20 Minutes Intravenous Every 4 hours 09/05/16 1617     09/05/16 1630  piperacillin-tazobactam (ZOSYN) IVPB 3.375 g  Status:  Discontinued     3.375 g 100 mL/hr over 30 Minutes Intravenous  Once 09/05/16 1624 09/05/16 1628       Objective: Physical Exam: Vitals:   09/05/16 1935 09/05/16 2039 09/06/16 0742 09/06/16 1421  BP:  127/71 120/67 108/64  Pulse:  95 99 86  Resp:  18 18 18   Temp:  99.5 F (37.5 C) 99.4 F (37.4 C) 98.9 F (37.2 C)  TempSrc:  Oral Oral Oral  SpO2:  100% 98% 98%  Weight: 95.3 kg (210 lb)     Height: 5\' 2"  (1.575 m)       Intake/Output Summary (Last 24 hours) at 09/06/16 1749 Last data filed at 09/06/16 1724  Gross per 24 hour  Intake             2540 ml  Output                0 ml  Net             2540 ml   Filed Weights   09/05/16 1935  Weight: 95.3 kg (210 lb)   General: Alert, Awake and Oriented to Time, Place and Person. Appear in mild distress, affect appropriate Eyes: PERRL, Conjunctiva normal ENT: Oral Mucosa  clear moist. Neck: no JVD, no Abnormal Mass Or lumps Cardiovascular: S1 and S2 Present, no Murmur, Peripheral Pulses Present Respiratory: normal respiratory effort, Bilateral Air entry equal and Decreased, no use of accessory muscle, Clear to Auscultation, no Crackles, no wheezes Abdomen: Bowel Sound present, Soft and no tenderness,  Skin: no redness, no Rash, no induration Extremities: no Pedal edema, no calf tenderness Neurologic: Grossly no focal neuro deficit. Bilaterally Equal motor strength  Data Reviewed: CBC:  Recent Labs Lab 09/02/16 1643 09/05/16 1450 09/06/16 0606  WBC 12.1* 9.8 7.1  NEUTROABS 8,107* 6.9  --   HGB 12.7 12.9 10.5*  HCT 38.1 39.2 32.9*  MCV  87.8 88.7 88.9  PLT 213 259 254   Basic Metabolic Panel:  Recent Labs Lab 09/05/16 1450 09/06/16 0606  NA 136 138  K 4.5 3.9  CL 99* 104  CO2 27 26  GLUCOSE 90 100*  BUN 15 9  CREATININE 0.76 0.65  CALCIUM 9.5 8.6*    Liver Function Tests:  Recent Labs Lab 09/05/16 1450 09/06/16 0606  AST 21 15  ALT 21 18  ALKPHOS 102 80  BILITOT 0.5 0.5  PROT 7.5 5.7*  ALBUMIN 3.3* 2.6*   No results for input(s): LIPASE, AMYLASE in the last 168 hours. No results for input(s): AMMONIA in the last 168 hours. Coagulation Profile:  Recent Labs Lab 09/05/16 2115  INR 1.05   Cardiac Enzymes: No results for input(s): CKTOTAL, CKMB, CKMBINDEX, TROPONINI in the last 168 hours. BNP (last 3 results) No results for input(s): PROBNP in the last 8760 hours. CBG: No results for input(s): GLUCAP in the last 168 hours. Studies: No results found.  Scheduled Meds: . [START ON 09/07/2016] enoxaparin (LOVENOX) injection  50 mg Subcutaneous Q24H  . hydrochlorothiazide  12.5 mg Oral Daily  . lisinopril  20 mg Oral Daily  . pantoprazole  40 mg Oral Daily  . triamcinolone  2 spray Nasal QPM  . venlafaxine XR  75 mg Oral QHS   Continuous Infusions: . [START ON 09/07/2016] sodium chloride    . ampicillin (OMNIPEN) IV 2 g (09/06/16 1724)   PRN Meds: acetaminophen **OR** acetaminophen, ibuprofen  Time spent: 35 minutes  Author: Lynden Oxford, MD [facility] Pager: [phone] 09/06/2016 5:49 PM  If 7PM-7AM, please contact night-coverage at [email or web address], password [facility]

## 2016-09-06 NOTE — Progress Notes (Signed)
    [facility] has been requested to perform a transesophageal echocardiogram on Jane Li for bacteremia.  After careful review of history and examination, the risks and benefits of transesophageal echocardiogram have been explained including risks of esophageal damage, perforation (1:10,000 risk), bleeding, pharyngeal hematoma as well as other potential complications associated with conscious sedation including aspiration, arrhythmia, respiratory failure and death. Alternatives to treatment were discussed, questions were answered. Patient is willing to proceed.   Dorthula MatasGREENE,Contrina Orona G, PA-C  09/06/2016 4:19 PM

## 2016-09-06 NOTE — Telephone Encounter (Signed)
PLEASE NOTE: All timestamps contained within this report are represented as [place] Standard Time. CONFIDENTIALTY NOTICE: This fax transmission is intended only for the addressee. It contains information that is legally privileged, confidential or otherwise protected from use or disclosure. If you are not the intended recipient, you are strictly prohibited from reviewing, disclosing, copying using or disseminating any of this information or taking any action in reliance on or regarding this information. If you have received this fax in error, please notify us immediately by telephone so that we can arrange for its return to [place]. Phone: [phone], Toll-Free: [phone], Fax: [phone] Page: 1 of 2 Call Id: [id number] [place] Patient Name: Jane Li Gender: Female DOB: 10/31/1970 Age: 46 Y 3 M 19 D Return Phone Number: City/State/Zip: [place] Client [place] Night - Client Client Site [place] Physician Eliezer Lofts - MD Who Is Calling Lab Lab Name [facility] Lab Phone Number [phone] opt 2 Lab Tech Name Velna Hatchet Lab Reference Number [id number] Chief Complaint Lab Result (Critical or Stat) Call Type Lab Send to RN Reason for Call Report lab results Initial Comment Brandi with [facility] has stat labs Additional Comment Nurse Assessment Nurse: Lavina Hamman, RN, Sarah Date/Time Eilene Ghazi Time): 09/02/2016 7:35:37 PM Is there an on-call provider listed? ---Yes Please document the following items: Lab name Lab value (read back to lab to verify) Reference range for lab value Date and time blood was drawn Collect time of birth for bilirubin results ---Velna Hatchet with [facility] Lab has stat labs WBC high 12.1 (3.8-10.8) RDW high 15.4 (11.0-15.0) Absolute neut high 8107 (1500-7800) Smear review- criteria was not met Collected today 09/02/16  4:43pm Please collect the patient contact information from the lab. (name, phone number and address) ---[phone] Guidelines Guideline Title Affirmed Question Disp. Time Eilene Ghazi Time) Disposition Final User 09/02/2016 8:30:22 PM Clinical Call Yes Lavina Hamman, RN, [place] Phone DateTime Action Result/Outcome Notes Martinique, Betty - MD [phone] 09/02/2016 7:42:42 PM Doctor Paged Paged On Call Back to Call Center [place], Betty - MD 09/02/2016 8:29:40 PM Message Result Spoke with On Call - General Spoke with on call MD Dr. Martinique. Provided stat lab results and informed that lab was to fax results to office. Martinique, Betty - MD [phone] 09/02/2016 8:29:51 PM Doctor Paged Called On Call Provider - Reached PLEASE NOTE: All timestamps contained within this report are represented as [place] Standard Time. CONFIDENTIALTY NOTICE: This fax transmission is intended only for the addressee. It contains information that is legally privileged, confidential or otherwise protected from use or disclosure. If you are not the intended recipient, you are strictly prohibited from reviewing, disclosing, copying using or disseminating any of this information or taking any action in reliance on or regarding this information. If you have received this fax in error, please notify us immediately by telephone so that we can arrange for its return to [place]. Phone: [phone], Toll-Free: [phone], Fax: [phone] Page: 2 of 2 Call Id: [id number] [place] Phone DateTime Action Result/Outcome Notes Martinique, Betty - MD 09/02/2016 8:30:08 PM Message Result Spoke with On Call - General

## 2016-09-06 NOTE — Telephone Encounter (Signed)
I agree positive blood cultures need to go to oncall doctor. We will review the protocol and discuss with [facility]

## 2016-09-06 NOTE — Progress Notes (Signed)
Patient received from ED via bed. Vital signs are stable. Patient is alert oriented x4. Skin assessment done with another nurse found intact. Patient is on telemetry and iv antiobiotics. Patient given instruction about call bell, light and unit routines.

## 2016-09-06 NOTE — Consult Note (Signed)
[facility] [place]  Total days of antibiotics 2        Day 2 Ampicillin          Admit Date: 09/05/2016       Reason for Consult: Enterococcus Bacteremia      Referring Physician: Dr. Posey Pronto   Active Problems:   BMI 37.0-37.9, adult   Migraine headache without aura   GERD   HTN (hypertension)   Bacteremia due to Enterococcus    HPI: Jane Li is a 46 y.o. female that was admitted to the hospital on 09/05/2016 as directed by her PCP in the setting of + BCx growing enterococcus. She has been having right ankle pain/redness for about 4 weeks now, first having been seen by PCP on 5/2 after laser therapy treatment? with chiropracter. At first this was dx as a sprain, however she failed supportive therapy and she developed cellulitis over the affected area --> started on Augmentin which improved her condition at first. She was also treated with prednisone taper for suspected gout flare. However, after she completed therapy her symptoms relapsed to which she was then treated with a course of Doxycycline of which she completed on 5/26. Presented to PCP on 5/28 with increased pain while ambulating in her right ankle and generalized malaise, fatigue, intermittent chills and nausea. Ceftriaxone IM x 1 in office and lab work was assessed. Venous duplex was obtained to r/o DVT which was negative bilaterally. XRay of right ankle on 5/08 with soft tissue swelling, no acute Fx; spurring present at dorsal margin. Of note she also has a history of lower back pain with DDD. She also complains of nausea for 3 months. No urinary symptoms currently. Also described some right knee and right groin pain that has subsided.   In the ED she was normothermic with acceptable BP (lowest was SBP in low 90s), however tachycardic initially (120s) initially; this improved with IVFs. Lactic acid normal at 1.3. BCx were obtained and ampicillin was started. Reports that she does feel better today.   Past  Medical History:  Diagnosis Date  . Allergy   . Chronic headaches    2/WEEK SINUS AND STRESS HEADACHES  . Deviated nasal septum   . GERD (gastroesophageal reflux disease)   . Hyperlipidemia   . Hypertension    CONTROLLED ON MEDS  . Motion sickness    CAR, [facility] [place]  . Nasal turbinate hypertrophy    NASAL OBSTRUCTION  . Obesity   . Shortness of breath dyspnea    ON EXERTION  . Sinusitis, chronic     Allergies:  Allergies  Allergen Reactions  . Codeine Nausea And Vomiting    Current antibiotics: Anti-infectives    Start     Dose/Rate Route Frequency Ordered Stop   09/05/16 1630  ampicillin (OMNIPEN) 2 g in sodium chloride 0.9 % 50 mL IVPB     2 g 150 mL/hr over 20 Minutes Intravenous Every 4 hours 09/05/16 1617     09/05/16 1630  piperacillin-tazobactam (ZOSYN) IVPB 3.375 g  Status:  Discontinued     3.375 g 100 mL/hr over 30 Minutes Intravenous  Once 09/05/16 1624 09/05/16 1628      MEDICATIONS: . [START ON 09/07/2016] enoxaparin (LOVENOX) injection  50 mg Subcutaneous Q24H  . hydrochlorothiazide  12.5 mg Oral Daily  . lisinopril  20 mg Oral Daily  . pantoprazole  40 mg Oral Daily  . triamcinolone  2 spray Nasal QPM  . venlafaxine XR  75 mg Oral QHS    Social History  Substance Use Topics  . Smoking status: Never Smoker  . Smokeless tobacco: Never Used  . Alcohol use 0.0 oz/week     Comment: rare    Family History  Problem Relation Age of Onset  . Depression Mother   . Mental illness Mother   . Hypertension Mother   . Arthritis Father   . Thyroid disease Father   . Mental illness Sister   . Breast cancer Maternal Grandmother   . Diabetes Paternal Grandmother   . Colon cancer Neg Hx   . Stomach cancer Neg Hx     Review of Systems - History obtained from chart review and the patient General ROS: positive for fevers/chills, maliase. Negative for weight loss Respiratory ROS: no cough, shortness of breath, or wheezing Cardiovascular ROS: Had some  chest pain  Gastrointestinal ROS: no abdominal pain, change in bowel habits, or black or bloody stools Nausea x 3 months Genito-Urinary ROS: no dysuria, trouble voiding, or hematuria Musculoskeletal ROS: right ankle pain and back pain as above Neurological ROS: no TIA or stroke symptoms   OBJECTIVE: Temp:  [98.9 F (37.2 C)-99.5 F (37.5 C)] 98.9 F (37.2 C) (05/29 1421) Pulse Rate:  [84-103] 86 (05/29 1421) Resp:  [15-25] 18 (05/29 1421) BP: (108-127)/(64-87) 108/64 (05/29 1421) SpO2:  [98 %-100 %] 98 % (05/29 1421) Weight:  [210 lb (95.3 kg)] 210 lb (95.3 kg) (05/28 1935)   General appearance: alert, cooperative, no distress. Well-nourished woman seated comfortably in bed.  Throat: lips, mucosa, and tongue normal; teeth and gums normal Back: symmetric, no curvature. ROM normal. No CVA tenderness. Resp: clear to auscultation bilaterally, normal effort Cardio: regular rate and rhythm, S1, S2 normal, no murmur, click, rub or gallop GI: soft, non-tender; bowel sounds normal; no masses,  no organomegaly Extremities: extremities normal, atraumatic, no cyanosis or edema. RLE - Small peeling to inner aspect of right ankle, slight red papular rash to posterio aspect of lower calf. No tenderness to palpation to RLE or LLE, R or L knee. No warmth or erythema.  Pulses: 2+ and symmetric  LABS: Results for orders placed or performed during the hospital encounter of 09/05/16 (from the past 48 hour(s))  Comprehensive metabolic panel     Status: Abnormal   Collection Time: 09/05/16  2:50 PM  Result Value Ref Range   Sodium 136 135 - 145 mmol/L   Potassium 4.5 3.5 - 5.1 mmol/L   Chloride 99 (L) 101 - 111 mmol/L   CO2 27 22 - 32 mmol/L   Glucose, Bld 90 65 - 99 mg/dL   BUN 15 6 - 20 mg/dL   Creatinine, Ser 0.76 0.44 - 1.00 mg/dL   Calcium 9.5 8.9 - 10.3 mg/dL   Total Protein 7.5 6.5 - 8.1 g/dL   Albumin 3.3 (L) 3.5 - 5.0 g/dL   AST 21 15 - 41 U/L   ALT 21 14 - 54 U/L   Alkaline  Phosphatase 102 38 - 126 U/L   Total Bilirubin 0.5 0.3 - 1.2 mg/dL   GFR calc non Af Amer >60 >60 mL/min   GFR calc Af Amer >60 >60 mL/min    Comment: (NOTE) The eGFR has been calculated using the CKD EPI equation. This calculation has not been validated in all clinical situations. eGFR's persistently <60 mL/min signify possible Chronic Kidney Disease.    Anion gap 10 5 - 15  CBC with Differential     Status: None  Collection Time: 09/05/16  2:50 PM  Result Value Ref Range   WBC 9.8 4.0 - 10.5 K/uL   RBC 4.42 3.87 - 5.11 MIL/uL   Hemoglobin 12.9 12.0 - 15.0 g/dL   HCT 39.2 36.0 - 46.0 %   MCV 88.7 78.0 - 100.0 fL   MCH 29.2 26.0 - 34.0 pg   MCHC 32.9 30.0 - 36.0 g/dL   RDW 14.8 11.5 - 15.5 %   Platelets 259 150 - 400 K/uL   Neutrophils Relative % 71 %   Neutro Abs 6.9 1.7 - 7.7 K/uL   Lymphocytes Relative 23 %   Lymphs Abs 2.2 0.7 - 4.0 K/uL   Monocytes Relative 5 %   Monocytes Absolute 0.5 0.1 - 1.0 K/uL   Eosinophils Relative 1 %   Eosinophils Absolute 0.1 0.0 - 0.7 K/uL   Basophils Relative 0 %   Basophils Absolute 0.0 0.0 - 0.1 K/uL  Culture, blood (Routine x 2)     Status: None (Preliminary result)   Collection Time: 09/05/16  2:50 PM  Result Value Ref Range   Specimen Description BLOOD RIGHT ANTECUBITAL    Special Requests      BOTTLES DRAWN AEROBIC ONLY Blood Culture adequate volume   Culture NO GROWTH < 24 HOURS    Report Status PENDING   I-Stat CG4 Lactic Acid, ED     Status: None   Collection Time: 09/05/16  3:11 PM  Result Value Ref Range   Lactic Acid, Venous 1.30 0.5 - 1.9 mmol/L  Urinalysis, Routine w reflex microscopic     Status: None   Collection Time: 09/05/16  4:45 PM  Result Value Ref Range   Color, Urine YELLOW YELLOW   APPearance CLEAR CLEAR   Specific Gravity, Urine 1.019 1.005 - 1.030   pH 6.0 5.0 - 8.0   Glucose, UA NEGATIVE NEGATIVE mg/dL   Hgb urine dipstick NEGATIVE NEGATIVE   Bilirubin Urine NEGATIVE NEGATIVE   Ketones, ur NEGATIVE  NEGATIVE mg/dL   Protein, ur NEGATIVE NEGATIVE mg/dL   Nitrite NEGATIVE NEGATIVE   Leukocytes, UA NEGATIVE NEGATIVE  I-Stat CG4 Lactic Acid, ED     Status: Abnormal   Collection Time: 09/05/16  6:57 PM  Result Value Ref Range   Lactic Acid, Venous 0.45 (L) 0.5 - 1.9 mmol/L  Protime-INR     Status: None   Collection Time: 09/05/16  9:15 PM  Result Value Ref Range   Prothrombin Time 13.7 11.4 - 15.2 seconds   INR 1.05   Comprehensive metabolic panel     Status: Abnormal   Collection Time: 09/06/16  6:06 AM  Result Value Ref Range   Sodium 138 135 - 145 mmol/L   Potassium 3.9 3.5 - 5.1 mmol/L   Chloride 104 101 - 111 mmol/L   CO2 26 22 - 32 mmol/L   Glucose, Bld 100 (H) 65 - 99 mg/dL   BUN 9 6 - 20 mg/dL   Creatinine, Ser 0.65 0.44 - 1.00 mg/dL   Calcium 8.6 (L) 8.9 - 10.3 mg/dL   Total Protein 5.7 (L) 6.5 - 8.1 g/dL   Albumin 2.6 (L) 3.5 - 5.0 g/dL   AST 15 15 - 41 U/L   ALT 18 14 - 54 U/L   Alkaline Phosphatase 80 38 - 126 U/L   Total Bilirubin 0.5 0.3 - 1.2 mg/dL   GFR calc non Af Amer >60 >60 mL/min   GFR calc Af Amer >60 >60 mL/min    Comment: (NOTE) The eGFR  has been calculated using the CKD EPI equation. This calculation has not been validated in all clinical situations. eGFR's persistently <60 mL/min signify possible Chronic Kidney Disease.    Anion gap 8 5 - 15  CBC     Status: Abnormal   Collection Time: 09/06/16  6:06 AM  Result Value Ref Range   WBC 7.1 4.0 - 10.5 K/uL   RBC 3.70 (L) 3.87 - 5.11 MIL/uL   Hemoglobin 10.5 (L) 12.0 - 15.0 g/dL   HCT 32.9 (L) 36.0 - 46.0 %   MCV 88.9 78.0 - 100.0 fL   MCH 28.4 26.0 - 34.0 pg   MCHC 31.9 30.0 - 36.0 g/dL   RDW 14.9 11.5 - 15.5 %   Platelets 254 150 - 400 K/uL    MICRO: Results for orders placed or performed during the hospital encounter of 09/05/16  Culture, blood (Routine x 2)     Status: None (Preliminary result)   Collection Time: 09/05/16  2:50 PM  Result Value Ref Range Status   Specimen Description  BLOOD RIGHT ANTECUBITAL  Final   Special Requests   Final    BOTTLES DRAWN AEROBIC ONLY Blood Culture adequate volume   Culture NO GROWTH < 24 HOURS  Final   Report Status PENDING  Incomplete     IMAGING: Dg Chest 2 View  Result Date: 09/05/2016 CLINICAL DATA:  Sepsis, bacteremia. EXAM: CHEST  2 VIEW COMPARISON:  Radiograph and CT scan of May 05, 2016. FINDINGS: The heart size and mediastinal contours are within normal limits. Both lungs are clear. No pneumothorax or pleural effusion is noted. The visualized skeletal structures are unremarkable. IMPRESSION: No active cardiopulmonary disease. Electronically Signed   By: Marijo Conception, M.D.   On: 09/05/2016 16:31    HISTORICAL MICRO/IMAGING  Assessment/Plan:  46 year old with ~4 week history of right ankle pain/cellulitis refractory to outpatient treatment now with enterococcus bacteremia in 1 set of blood cultures obtained outpatient. No clear source at this time as her exam is unrevealing.   Bacteremia, Enterococcus Species 5/25 BCx + in 2 bottles  - BCx 5/29 >> NGTD 1/1 bottles drawn --> unsure as to why one set was drawn, will order repeat set to get more information  -Agree with imaging of her heart with TEE as we have no source for now -Continue ampicillin    Right Ankle Pain  - Considering chronicity of the pain and recent failure x 2 of antibiotics will need to evaluate for source. Agree with MRI w/ and w/o contrast to evaluate for possible septic arthritis.  - Consider checking ESR/CRP to establish a baseline.   Will continue to follow as we gather more information. Further recs to come. Hopefully can ID the cause.   [name], MSN, NP-C [facility] for Infectious [place] Cell: [phone] Pager: [phone]  09/06/2016 4:14 PM

## 2016-09-07 ENCOUNTER — Encounter (HOSPITAL_COMMUNITY)
Admission: EM | Disposition: A | Discharge status: Home / Self Care | Payer: Self-pay | Source: Home / Self Care | Attending: Internal Medicine

## 2016-09-07 ENCOUNTER — Other Ambulatory Visit (HOSPITAL_COMMUNITY): Payer: BC Managed Care – PPO

## 2016-09-07 ENCOUNTER — Inpatient Hospital Stay (HOSPITAL_COMMUNITY): Payer: BC Managed Care – PPO

## 2016-09-07 ENCOUNTER — Encounter (HOSPITAL_COMMUNITY): Payer: Self-pay | Admitting: *Deleted

## 2016-09-07 ENCOUNTER — Other Ambulatory Visit: Payer: Self-pay | Admitting: Family Medicine

## 2016-09-07 DIAGNOSIS — R7881 Bacteremia: Principal | ICD-10-CM

## 2016-09-07 DIAGNOSIS — B952 Enterococcus as the cause of diseases classified elsewhere: Secondary | ICD-10-CM

## 2016-09-07 DIAGNOSIS — I340 Nonrheumatic mitral (valve) insufficiency: Secondary | ICD-10-CM

## 2016-09-07 DIAGNOSIS — I10 Essential (primary) hypertension: Secondary | ICD-10-CM

## 2016-09-07 HISTORY — PX: TEE WITHOUT CARDIOVERSION: SHX5443

## 2016-09-07 SURGERY — ECHOCARDIOGRAM, TRANSESOPHAGEAL
Anesthesia: Moderate Sedation

## 2016-09-07 MED ORDER — MIDAZOLAM HCL 5 MG/ML IJ SOLN
INTRAMUSCULAR | Status: AC
  Filled 2016-09-07: qty 2

## 2016-09-07 MED ORDER — FENTANYL CITRATE (PF) 100 MCG/2ML IJ SOLN
INTRAMUSCULAR | Status: AC
  Filled 2016-09-07: qty 2

## 2016-09-07 MED ORDER — FENTANYL CITRATE (PF) 100 MCG/2ML IJ SOLN
INTRAMUSCULAR | Status: DC | PRN
  Administered 2016-09-07 (×4): 25 ug via INTRAVENOUS

## 2016-09-07 MED ORDER — BUTAMBEN-TETRACAINE-BENZOCAINE 2-2-14 % EX AERO
INHALATION_SPRAY | CUTANEOUS | Status: DC | PRN
  Administered 2016-09-07: 1 via TOPICAL

## 2016-09-07 MED ORDER — MIDAZOLAM HCL 10 MG/2ML IJ SOLN
INTRAMUSCULAR | Status: DC | PRN
  Administered 2016-09-07 (×4): 2 mg via INTRAVENOUS

## 2016-09-07 MED ORDER — GADOBENATE DIMEGLUMINE 529 MG/ML IV SOLN
20.0000 mL | Freq: Once | INTRAVENOUS | Status: AC | PRN
  Administered 2016-09-07: 20 mL via INTRAVENOUS

## 2016-09-07 MED ORDER — DIPHENHYDRAMINE HCL 50 MG/ML IJ SOLN
INTRAMUSCULAR | Status: AC
  Filled 2016-09-07: qty 1

## 2016-09-07 NOTE — CV Procedure (Signed)
During this procedure the patient is administered a total of Versed 8 mg and Fentanyl 100 mg to achieve and maintain moderate conscious sedation.  The patient's heart rate, blood pressure, and oxygen saturation are monitored continuously during the procedure. The period of conscious sedation is 30 minutes, of which I was present face-to-face 100% of this time.  TEE:  Normal TV,MV, PV No SBE / vegetation Trileaflet AV with mild AR EF 60% No LAA thrombus No ASD/PFO No effusion No aortic debris Normal RV Normal LV EF 60%

## 2016-09-07 NOTE — Progress Notes (Signed)
  Echocardiogram Echocardiogram Transesophageal has been performed.  Jane Li T [name] 09/07/2016, 1:19 PM

## 2016-09-07 NOTE — Progress Notes (Signed)
PROGRESS NOTE   Jane Li  ZOX:[id number]    DOB: 08-20-1970    DOA: 09/05/2016  PCP: Excell Seltzer, MD   I have briefly reviewed patients previous medical records in [facility].  Brief Narrative:  46 year old female with PMH of migraine, obesity, HTN, HLD, outpatient treatment of presumed right ankle cellulitis with Augmentin and ceftriaxone, did not see any improvement and hence blood cultures drawn which showed enterococcus bacteremia and admitted for further workup. ID consulted.   Assessment & Plan:   Active Problems:   BMI 37.0-37.9, adult   Migraine headache without aura   GERD   HTN (hypertension)   Bacteremia   1. Enterococcus bacteremia: 2 of 2 blood cultures from 09/03/16 positive for enterococcus. Surveillance blood cultures from 5/29: Negative to date. MRI of right ankle shows thrombophlebitis of the posterior medial aspect of the ankle and foot. TEE 5/30 without vegetations. Discussed with Dr. Drue Second who recommends continuing additional IV antibiotics overnight, PICC line placement in a.m. with plans to complete total 4 weeks of IV ampicillin. 2. Essential hypertension: Blood pressures soft to low normal. If continues to have hypotensive episodes, may have to hold or DC HCTZ or lisinopril. 3. Anemia: Follow CBCs. 4. Migraine headaches: None in the hospital. 5. RLE pain: Right Lower extremity venous Dopplers without DVT or SVT. Supportive treatment. 6. Right ankle pain: Refractory to recent outpatient antibiotic treatment. MRI shows thrombophlebitis? Etiology of her pain. Supportive treatment with NSAIDs.    DVT prophylaxis: Subcutaneous heparin Code Status: Full Family Communication: None at bedside Disposition: DC home possibly 5/31   Consultants:  Infectious disease Cardiology   Procedures:  TEE 5/30  Antimicrobials:  IV ampicillin    Subjective: Complains of some pain behind the right knee . Patient was seen prior to TEE this  morning.  ROS: No fever, chills, headaches.  Objective:  Vitals:   09/07/16 1305 09/07/16 1315 09/07/16 1330 09/07/16 1345  BP: (!) 95/36 (!) 83/53 100/60 93/61  Pulse: 80 83 79 84  Resp: (!) 22 (!) 22 (!) 24 (!) 22  Temp: 98.3 F (36.8 C)     TempSrc: Oral     SpO2: 96% 93% 92% 93%  Weight:      Height:        Examination:  General exam: Pleasant young female sitting up comfortably in bed. Respiratory system: Clear to auscultation. Respiratory effort normal. Cardiovascular system: S1 & S2 heard, RRR. No JVD, murmurs, rubs, gallops or clicks. No pedal edema. Gastrointestinal system: Abdomen is nondistended, soft and nontender. No organomegaly or masses felt. Normal bowel sounds heard. Central nervous system: Alert and oriented. No focal neurological deficits. Extremities: Symmetric 5 x 5 power. Right lower extremity without acute findings. Skin: No rashes, lesions or ulcers Psychiatry: Judgement and insight appear normal. Mood & affect appropriate.     Data Reviewed: I have personally reviewed following labs and imaging studies  CBC:  Recent Labs Lab 09/02/16 1643 09/05/16 1450 09/06/16 0606  WBC 12.1* 9.8 7.1  NEUTROABS 8,107* 6.9  --   HGB 12.7 12.9 10.5*  HCT 38.1 39.2 32.9*  MCV 87.8 88.7 88.9  PLT 213 259 254   Basic Metabolic Panel:  Recent Labs Lab 09/05/16 1450 09/06/16 0606  NA 136 138  K 4.5 3.9  CL 99* 104  CO2 27 26  GLUCOSE 90 100*  BUN 15 9  CREATININE 0.76 0.65  CALCIUM 9.5 8.6*   Liver Function Tests:  Recent Labs Lab 09/05/16 1450  09/06/16 0606  AST 21 15  ALT 21 18  ALKPHOS 102 80  BILITOT 0.5 0.5  PROT 7.5 5.7*  ALBUMIN 3.3* 2.6*   Coagulation Profile:  Recent Labs Lab 09/05/16 2115  INR 1.05     Recent Results (from the past 240 hour(s))  Culture, blood (single)     Status: None   Collection Time: 09/02/16  4:43 PM  Result Value Ref Range Status   Culture ENTEROCOCCUS SPECIES  Final   Organism ID, Bacteria  ENTEROCOCCUS SPECIES  Final    Comment: Critical Results Called to,Read Back By and Verified With: AMY RN @840PM  Payton DoughtyVINCJ [phone] ISOLATED IN BOTH BOTTLES       Susceptibility   Enterococcus species -  (no method available)    AMPICILLIN <=2 Sensitive     VANCOMYCIN 2 Sensitive   Culture, blood (single)     Status: None   Collection Time: 09/02/16  4:43 PM  Result Value Ref Range Status   Culture ENTEROCOCCUS SPECIES  Final   Organism ID, Bacteria ENTEROCOCCUS SPECIES  Final    Comment: Critical Results Called to,Read Back By and Verified With: AMY RN @840PM  VINCJ ISOLATED IN BOTH BOTTLES       Susceptibility   Enterococcus species -  (no method available)    AMPICILLIN <=2 Sensitive     VANCOMYCIN 2 Sensitive   Culture, blood (Routine x 2)     Status: None (Preliminary result)   Collection Time: 09/05/16  2:50 PM  Result Value Ref Range Status   Specimen Description BLOOD RIGHT ANTECUBITAL  Final   Special Requests   Final    BOTTLES DRAWN AEROBIC ONLY Blood Culture adequate volume   Culture NO GROWTH 2 DAYS  Final   Report Status PENDING  Incomplete  Culture, blood (routine x 2)     Status: None (Preliminary result)   Collection Time: 09/06/16  7:23 PM  Result Value Ref Range Status   Specimen Description BLOOD RIGHT HAND  Final   Special Requests IN PEDIATRIC BOTTLE Blood Culture adequate volume  Final   Culture NO GROWTH < 24 HOURS  Final   Report Status PENDING  Incomplete  Culture, blood (routine x 2)     Status: None (Preliminary result)   Collection Time: 09/06/16  7:30 PM  Result Value Ref Range Status   Specimen Description BLOOD RIGHT ANTECUBITAL  Final   Special Requests IN PEDIATRIC BOTTLE Blood Culture adequate volume  Final   Culture NO GROWTH < 24 HOURS  Final   Report Status PENDING  Incomplete         Radiology Studies: Mr Ankle Right W Wo Contrast  Result Date: 09/07/2016 CLINICAL DATA:  Pain and tenderness and swelling of the right ankle. EXAM:  MRI OF THE RIGHT ANKLE WITHOUT AND WITH CONTRAST TECHNIQUE: Multiplanar, multisequence MR imaging of the ankle was performed before and after the administration of intravenous contrast. CONTRAST:  20mL MULTIHANCE GADOBENATE DIMEGLUMINE 529 MG/ML IV SOLN COMPARISON:  Radiographs dated 08/16/2016 FINDINGS: Soft tissues: There is inflammation in the soft tissues at the posteromedial aspect of the ankle extending onto the plantar aspect of the foot medial to the calcaneus. There are thrombosed veins in the plantar aspect of the foot best seen on series 13. TENDONS Peroneal: Intact peroneus longus and peroneus brevis tendons. Posteromedial: Small amount of fluid in the sheath of the posterior tibialis tendon just distal to the tip of the medial malleolus. No enhancement after contrast administration. Anterior: Intact tibialis anterior,  extensor hallucis longus and extensor digitorum longus tendons. Achilles: Normal. Plantar Fascia: Focal degenerative changes of the origin of the medial band. LIGAMENTS Lateral: Intact. Medial: Intact. CARTILAGE Ankle Joint: No joint effusion or chondral defect. Subtalar Joints/Sinus Tarsi: No joint effusion or chondral defect. Bones: Plantar calcaneal spur appear minimal dorsal spurring on the navicular. IMPRESSION: IMPRESSION Thrombophlebitis of the posteromedial aspect of the ankle and foot. Electronically Signed   By: Francene Boyers M.D.   On: 09/07/2016 12:30        Scheduled Meds: . enoxaparin (LOVENOX) injection  50 mg Subcutaneous Q24H  . hydrochlorothiazide  12.5 mg Oral Daily  . lisinopril  20 mg Oral Daily  . pantoprazole  40 mg Oral Daily  . triamcinolone  2 spray Nasal QPM  . venlafaxine XR  75 mg Oral QHS   Continuous Infusions: . ampicillin (OMNIPEN) IV Stopped (09/07/16 1436)     LOS: 2 days     Lakira Ogando, MD, FACP, FHM. [facility] Pager [phone]  If 7PM-7AM, please contact night-coverage [email or web address] Password [facility] 09/07/2016,  4:54 PM

## 2016-09-07 NOTE — Progress Notes (Signed)
[facility]    Date of Admission:  09/05/2016   Total days of antibiotics 3 of ampicillin          ID: Jane Li is a 46 y.o. female with enterococcal bacteremia Active Problems:   BMI 37.0-37.9, adult   Migraine headache without aura   GERD   HTN (hypertension)   Bacteremia    Subjective: Afebrile, she underwent TEE which was negative and mri of ankle which found thrombophlebitis. Still has some pain behind right knee  Medications:  . enoxaparin (LOVENOX) injection  50 mg Subcutaneous Q24H  . hydrochlorothiazide  12.5 mg Oral Daily  . lisinopril  20 mg Oral Daily  . pantoprazole  40 mg Oral Daily  . triamcinolone  2 spray Nasal QPM  . venlafaxine XR  75 mg Oral QHS    Objective: Vital signs in last 24 hours: Temp:  [98.3 F (36.8 C)-98.7 F (37.1 C)] 98.3 F (36.8 C) (05/30 1305) Pulse Rate:  [79-92] 84 (05/30 1345) Resp:  [12-24] 22 (05/30 1345) BP: (83-132)/(36-81) 93/61 (05/30 1345) SpO2:  [92 %-99 %] 93 % (05/30 1345) Physical Exam  Constitutional:  oriented to person, place, and time. appears well-developed and well-nourished. No distress.  HENT: [place]/AT, PERRLA, no scleral icterus Mouth/Throat: Oropharynx is clear and moist. No oropharyngeal exudate.  Cardiovascular: Normal rate, regular rhythm and normal heart sounds. Exam reveals no gallop and no friction rub.  No murmur heard.  Pulmonary/Chest: Effort normal and breath sounds normal. No respiratory distress.  has no wheezes.  Neck = supple, no nuchal rigidity Abdominal: Soft. Bowel sounds are normal.  exhibits no distension. There is no tenderness.  Lymphadenopathy: no cervical adenopathy. No axillary adenopathy Neurological: alert and oriented to person, place, and time.  Ext: right knee is benign, no effusion, no warmth, no erythema, full ROM Skin: Skin is warm and dry. No rash noted. No erythema.  Psychiatric: a normal mood and affect.  behavior is normal.    Lab  Results  Recent Labs  09/05/16 1450 09/06/16 0606  WBC 9.8 7.1  HGB 12.9 10.5*  HCT 39.2 32.9*  NA 136 138  K 4.5 3.9  CL 99* 104  CO2 27 26  BUN 15 9  CREATININE 0.76 0.65   Liver Panel  Recent Labs  09/05/16 1450 09/06/16 0606  PROT 7.5 5.7*  ALBUMIN 3.3* 2.6*  AST 21 15  ALT 21 18  ALKPHOS 102 80  BILITOT 0.5 0.5    Microbiology: 5/25 enterococcus 5/28 blood cx ngtd 5/29 blood cx ngtd Studies/Results: Mr Ankle Right W Wo Contrast  Result Date: 09/07/2016 CLINICAL DATA:  Pain and tenderness and swelling of the right ankle. EXAM: MRI OF THE RIGHT ANKLE WITHOUT AND WITH CONTRAST TECHNIQUE: Multiplanar, multisequence MR imaging of the ankle was performed before and after the administration of intravenous contrast. CONTRAST:  20mL MULTIHANCE GADOBENATE DIMEGLUMINE 529 MG/ML IV SOLN COMPARISON:  Radiographs dated 08/16/2016 FINDINGS: Soft tissues: There is inflammation in the soft tissues at the posteromedial aspect of the ankle extending onto the plantar aspect of the foot medial to the calcaneus. There are thrombosed veins in the plantar aspect of the foot best seen on series 13. TENDONS Peroneal: Intact peroneus longus and peroneus brevis tendons. Posteromedial: Small amount of fluid in the sheath of the posterior tibialis tendon just distal to the tip of the medial malleolus. No enhancement after contrast administration. Anterior: Intact tibialis anterior, extensor hallucis longus and extensor digitorum longus  tendons. Achilles: Normal. Plantar Fascia: Focal degenerative changes of the origin of the medial band. LIGAMENTS Lateral: Intact. Medial: Intact. CARTILAGE Ankle Joint: No joint effusion or chondral defect. Subtalar Joints/Sinus Tarsi: No joint effusion or chondral defect. Bones: Plantar calcaneal spur appear minimal dorsal spurring on the navicular. IMPRESSION: IMPRESSION Thrombophlebitis of the posteromedial aspect of the ankle and foot. Electronically Signed   By:  Francene Boyers M.D.   On: 09/07/2016 12:30     Assessment/Plan: Enterococcal bacteremia complicated by thrombophlebitis of right ankle = concern that may be septic thrombophlebitis. Would aim to try to treat for 4 wk of IV ampicillin.  Right knee pain = doppler ruled out DVT or baker's cyst  Drue Second [facility] for Infectious Diseases Cell: [phone] Pager: [phone]  09/07/2016, 8:08 PM

## 2016-09-07 NOTE — Interval H&P Note (Signed)
History and Physical Interval Note:  09/07/2016 11:30 AM  Jane Li  has presented today for surgery, with the diagnosis of BACTEREMIA  The various methods of treatment have been discussed with the patient and family. After consideration of risks, benefits and other options for treatment, the patient has consented to  Procedure(s): TRANSESOPHAGEAL ECHOCARDIOGRAM (TEE) (N/A) as a surgical intervention .  The patient's history has been reviewed, patient examined, no change in status, stable for surgery.  I have reviewed the patient's chart and labs.  Questions were answered to the patient's satisfaction.     Charlton HawsPeter Jaiyden Laur

## 2016-09-08 MED ORDER — AMPICILLIN IV (FOR PTA / DISCHARGE USE ONLY): 2.0000 g | INTRAVENOUS | 0 refills | Status: DC

## 2016-09-08 MED ORDER — SODIUM CHLORIDE 0.9% FLUSH
10.0000 mL | INTRAVENOUS | Status: DC | PRN
  Administered 2016-09-09: 10 mL
  Filled 2016-09-08: qty 40

## 2016-09-08 NOTE — Progress Notes (Signed)
09/08/2016 Patient refused to continue to wear telemetry, and requested it be removed. Patients request was followed and patient was removed from telemetry. Pt is being discharged tomorrow for home IV antibiotic infusion. Home Health unable to do teaching and administration tonight, thus patient is staying in the hospital for antibiotic administration and will be discharged tomorrow.

## 2016-09-08 NOTE — Progress Notes (Signed)
Peripherally Inserted Central Catheter/Midline Placement  The IV Nurse has discussed with the patient and/or persons authorized to consent for the patient, the purpose of this procedure and the potential benefits and risks involved with this procedure.  The benefits include less needle sticks, lab draws from the catheter, and the patient may be discharged home with the catheter. Risks include, but not limited to, infection, bleeding, blood clot (thrombus formation), and puncture of an artery; nerve damage and irregular heartbeat and possibility to perform a PICC exchange if needed/ordered by physician.  Alternatives to this procedure were also discussed.  Bard Power PICC patient education guide, fact sheet on infection prevention and patient information card has been provided to patient /or left at bedside.    PICC/Midline Placement Documentation  PICC Single Lumen 09/08/16 PICC Right Basilic 40 cm 1 cm (Active)  Indication for Insertion or Continuance of Line Home intravenous therapies (PICC only) 09/08/2016  3:00 PM  Exposed Catheter (cm) 1 cm 09/08/2016  3:00 PM  Dressing Change Due 09/15/16 09/08/2016  3:00 PM       Consuello Masseimmons, Dhyan Noah M 09/08/2016, 3:46 PM

## 2016-09-08 NOTE — Discharge Summary (Signed)
Physician Discharge Summary  Jane Li:[id number] DOB: 06-17-1970  PCP: Jinny Sanders, MD  Admit date: 09/05/2016 Discharge date: 09/09/16  Recommendations for Outpatient Follow-up:  1. Dr. Algie Coffer, PCP in 1 week. Please follow final surveillance blood culture results that were sent from the hospital on 09/05/16 and 09/06/16. Please also follow weekly labs (CBC with differential & BMP) to be drawn by home health services while she is on prolonged IV antibiotics. 2. Dr. Carlyle Basques, Infectious Disease 3-4 weeks. Lab works associated with prolonged IV antibiotics will be faxed automatically to [facility] 3. PICC line to be removed after completion of IV antibiotics  Home Health: RN Equipment/Devices: None    Discharge Condition: Improved and stable  CODE STATUS: Full  Diet recommendation: Heart healthy diet.  Discharge Diagnoses:  Active Problems:   BMI 37.0-37.9, adult   Migraine headache without aura   GERD   HTN (hypertension)   Bacteremia   Brief Summary: 46 year old female with PMH of migraine, obesity, HTN, HLD, outpatient treatment of presumed right ankle cellulitis with Augmentin and ceftriaxone, did not see any improvement and hence blood cultures were drawn which showed enterococcus bacteremia and she was admitted for further workup. ID consulted.   Assessment & Plan:   1. Enterococcus bacteremia: 2 of 2 blood cultures from 09/03/16 positive for enterococcus. Surveillance blood cultures from 5/28 & 5/29: Negative to date. MRI of right ankle shows thrombophlebitis of the posterior medial aspect of the ankle and foot. TEE 5/30 without vegetations. As per ID follow-up, patient had enterococcal bacteremia complicated by thrombophlebitis of right ankle and concerned that it may be septic thrombophlebitis. ID recommends treating with 4 weeks of IV ampicillin and last day of therapy: 10/04/2016. 2. Essential hypertension:  controlled on HCTZ/lisinopril. 3. Anemia: Follow  CBCs in a.m. 4. Migraine headaches: None in the hospital. 5. RLE pain: Right Lower extremity venous Dopplers without DVT or SVT. Supportive treatment. 6. Right ankle pain: Refractory to recent outpatient antibiotic treatment. MRI shows thrombophlebitis, ID suspect septic thrombophlebitis? Etiology of her pain. Supportive treatment with NSAIDs and antibiotic management as above. Advised patient that if symptoms do not continue to improve or worsen, may consider outpatient orthopedic consultation.      Consultants:  Infectious disease Cardiology   Procedures:  TEE 5/30: PICC line placed 5/31   Discharge Instructions  Discharge Instructions    Call MD for:  redness, tenderness, or signs of infection (pain, swelling, redness, odor or green/yellow discharge around incision site)    Complete by:  As directed    Call MD for:  severe uncontrolled pain    Complete by:  As directed    Call MD for:  temperature >100.4    Complete by:  As directed    Diet - low sodium heart healthy    Complete by:  As directed    Home infusion instructions [facility] May follow [place] Dosing Protocol; May administer Cathflo as needed to maintain patency of vascular access device.; Flushing of vascular access device: per [facility] Protocol: 0.9% NaCl pre/post medica...    Complete by:  As directed    Instructions:  May follow [place] Dosing Protocol   Instructions:  May administer Cathflo as needed to maintain patency of vascular access device.   Instructions:  Flushing of vascular access device: per [facility] Protocol: 0.9% NaCl pre/post medication administration and prn patency; Heparin 100 u/ml, 56m for implanted ports and Heparin 10u/ml, 559mfor all other central venous catheters.  Instructions:  May follow [facility] Anaphylaxis Protocol for First Dose Administration in the home: 0.9% NaCl at 25-50 ml/hr to maintain IV access for protocol meds. Epinephrine 0.3 ml IV/IM PRN and Benadryl 25-50 IV/IM  PRN s/s of anaphylaxis.   Instructions:  [facility] Infusion Coordinator (RN) to assist per patient IV care needs in the home PRN.   Increase activity slowly    Complete by:  As directed        Medication List    TAKE these medications   ampicillin IVPB Inject 2 g into the vein every 4 (four) hours. Indication:  Enterococcus sepsis Last Day of Therapy:  October 04, 2016 Labs - Once weekly:  CBC/D and BMP, Labs - Every other week:  ESR and CRP   calcium carbonate 500 MG chewable tablet Commonly known as:  TUMS - dosed in mg elemental calcium Chew 1-2 tablets by mouth every 6 (six) hours as needed for indigestion or heartburn.   GAS-X 80 MG chewable tablet Generic drug:  simethicone Chew 80 mg by mouth as needed for flatulence.   ibuprofen 200 MG tablet Commonly known as:  ADVIL,MOTRIN Take 600 mg by mouth every 6 (six) hours as needed for headache or mild pain.   lisinopril-hydrochlorothiazide 20-12.5 MG tablet Commonly known as:  PRINZIDE,ZESTORETIC TAKE 1 TABLET BY MOUTH DAILY.   NASACORT ALLERGY 24HR 55 MCG/ACT Aero nasal inhaler Generic drug:  triamcinolone Place 2 sprays into the nose every evening.   NON FORMULARY On Guard Beadlets: (Proprietary blend of [place], [place], [place], [place], and [place] contained in tiny vegetable beadlets) Take 2-3 beadlets by mouth once daily   NON FORMULARY Essential Oil(s) Lemon Oil Capsules: Take 1 capsule by mouth one to two times a day   NON FORMULARY Essential Oil(s) Oregano Capsules: Take 1 capsule by mouth one to two times a day   NON FORMULARY Essential Oil(s) Grapefruit Capsules: Take 1 capsule by mouth one to two times a day   nystatin-triamcinolone cream Commonly known as:  MYCOLOG II Apply to the affected areas three times a day as needed for irritation   omeprazole 40 MG capsule Commonly known as:  PRILOSEC TAKE 1 CAPSULE (40 MG TOTAL) BY MOUTH DAILY.   SUMAtriptan 100 MG tablet Commonly known as:   IMITREX 1 tab po as needed for migraine. May repeat in 2 hours if headache persists or recurs. What changed:  how much to take  how to take this  when to take this  reasons to take this  additional instructions   venlafaxine XR 75 MG 24 hr capsule Commonly known as:  EFFEXOR-XR Take 75 mg by mouth at bedtime.      Follow-up Information    Jinny Sanders, MD. Schedule an appointment as soon as possible for a visit in 1 week(s).   Specialty:  Family Medicine Contact information: [place] [address] [phone]        Carlyle Basques, MD. Schedule an appointment as soon as possible for a visit in 3 week(s).   Specialty:  Infectious Diseases Contact information: [place] [place] [address] [phone]          Allergies  Allergen Reactions  . Codeine Nausea And Vomiting      Procedures/Studies: Dg Chest 2 View  Result Date: 09/05/2016 CLINICAL DATA:  Sepsis, bacteremia. EXAM: CHEST  2 VIEW COMPARISON:  Radiograph and CT scan of May 05, 2016. FINDINGS: The heart size and  mediastinal contours are within normal limits. Both lungs are clear. No pneumothorax or pleural effusion is noted. The visualized skeletal structures are unremarkable. IMPRESSION: No active cardiopulmonary disease. Electronically Signed   By: Marijo Conception, M.D.   On: 09/05/2016 16:31   Dg Ankle Complete Right  Result Date: 08/16/2016 CLINICAL DATA:  Medial acute RIGHT ankle pain for 1 week, no trauma EXAM: RIGHT ANKLE - COMPLETE 3+ VIEW COMPARISON:  None FINDINGS: Osseous mineralization normal. Moderate-sized plantar calcaneal spur. Ankle joint spaces preserved. No acute fracture, dislocation or bone destruction. Medial soft tissue swelling present. Spurring present at dorsal margin of the tarsal navicular. IMPRESSION: Medial soft tissue swelling without acute bony abnormality. Calcaneal spurring. Electronically Signed   By: Lavonia Dana M.D.    On: 08/16/2016 10:30   Mr Ankle Right W Wo Contrast  Result Date: 09/07/2016 CLINICAL DATA:  Pain and tenderness and swelling of the right ankle. EXAM: MRI OF THE RIGHT ANKLE WITHOUT AND WITH CONTRAST TECHNIQUE: Multiplanar, multisequence MR imaging of the ankle was performed before and after the administration of intravenous contrast. CONTRAST:  1m MULTIHANCE GADOBENATE DIMEGLUMINE 529 MG/ML IV SOLN COMPARISON:  Radiographs dated 08/16/2016 FINDINGS: Soft tissues: There is inflammation in the soft tissues at the posteromedial aspect of the ankle extending onto the plantar aspect of the foot medial to the calcaneus. There are thrombosed veins in the plantar aspect of the foot best seen on series 13. TENDONS Peroneal: Intact peroneus longus and peroneus brevis tendons. Posteromedial: Small amount of fluid in the sheath of the posterior tibialis tendon just distal to the tip of the medial malleolus. No enhancement after contrast administration. Anterior: Intact tibialis anterior, extensor hallucis longus and extensor digitorum longus tendons. Achilles: Normal. Plantar Fascia: Focal degenerative changes of the origin of the medial band. LIGAMENTS Lateral: Intact. Medial: Intact. CARTILAGE Ankle Joint: No joint effusion or chondral defect. Subtalar Joints/Sinus Tarsi: No joint effusion or chondral defect. Bones: Plantar calcaneal spur appear minimal dorsal spurring on the navicular. IMPRESSION: IMPRESSION Thrombophlebitis of the posteromedial aspect of the ankle and foot. Electronically Signed   By: JLorriane ShireM.D.   On: 09/07/2016 12:30      Subjective: Ongoing dull aching pain from left ankle posteriorly up to back of knee, rated at 3/4, no aggravating or relieving factors, not limiting ambulation or activity.  Discharge Exam:  Vitals:   09/07/16 1345 09/07/16 2341 09/08/16 0533 09/08/16 0858  BP: 93/61 (!) 95/55 110/70 116/74  Pulse: 84 86 94   Resp: (!) '22 18 18   ' Temp:  97.7 F (36.5 C) 99  F (37.2 C)   TempSrc:  Oral Oral   SpO2: 93% 96% 97%   Weight:      Height:        General exam: Pleasant young female seen ambulating comfortably in the room. Respiratory system: Clear to auscultation. Respiratory effort normal. Cardiovascular system: S1 & S2 heard, RRR. No JVD, murmurs, rubs, gallops or clicks. No pedal edema. Telemetry: Mostly sinus rhythm. Occasional sinus tachycardia up to 150-160, transient and associated with activity and asymptomatic. Gastrointestinal system: Abdomen is nondistended, soft and nontender. No organomegaly or masses felt. Normal bowel sounds heard. Central nervous system: Alert and oriented. No focal neurological deficits. Extremities: Symmetric 5 x 5 power. Right lower extremity without acute findings on today's exam as well. Skin: No rashes, lesions or ulcers Psychiatry: Judgement and insight appear normal. Mood & affect appropriate.     The results of significant diagnostics from this  hospitalization (including imaging, microbiology, ancillary and laboratory) are listed below for reference.     Microbiology: Recent Results (from the past 240 hour(s))  Culture, blood (single)     Status: None   Collection Time: 09/02/16  4:43 PM  Result Value Ref Range Status   Culture ENTEROCOCCUS SPECIES  Final   Organism ID, Bacteria ENTEROCOCCUS SPECIES  Final    Comment: Critical Results Called to,Read Back By and Verified With: AMY RN '@840PM'  VINCJ [id number] ISOLATED IN BOTH BOTTLES       Susceptibility   Enterococcus species -  (no method available)    AMPICILLIN <=2 Sensitive     VANCOMYCIN 2 Sensitive   Culture, blood (single)     Status: None   Collection Time: 09/02/16  4:43 PM  Result Value Ref Range Status   Culture ENTEROCOCCUS SPECIES  Final   Organism ID, Bacteria ENTEROCOCCUS SPECIES  Final    Comment: Critical Results Called to,Read Back By and Verified With: AMY RN '@840PM'  VINCJ ISOLATED IN BOTH BOTTLES       Susceptibility    Enterococcus species -  (no method available)    AMPICILLIN <=2 Sensitive     VANCOMYCIN 2 Sensitive   Culture, blood (Routine x 2)     Status: None (Preliminary result)   Collection Time: 09/05/16  2:50 PM  Result Value Ref Range Status   Specimen Description BLOOD RIGHT ANTECUBITAL  Final   Special Requests   Final    BOTTLES DRAWN AEROBIC ONLY Blood Culture adequate volume   Culture NO GROWTH 3 DAYS  Final   Report Status PENDING  Incomplete  Culture, blood (routine x 2)     Status: None (Preliminary result)   Collection Time: 09/06/16  7:23 PM  Result Value Ref Range Status   Specimen Description BLOOD RIGHT HAND  Final   Special Requests IN PEDIATRIC BOTTLE Blood Culture adequate volume  Final   Culture NO GROWTH 2 DAYS  Final   Report Status PENDING  Incomplete  Culture, blood (routine x 2)     Status: None (Preliminary result)   Collection Time: 09/06/16  7:30 PM  Result Value Ref Range Status   Specimen Description BLOOD RIGHT ANTECUBITAL  Final   Special Requests IN PEDIATRIC BOTTLE Blood Culture adequate volume  Final   Culture NO GROWTH 2 DAYS  Final   Report Status PENDING  Incomplete     Labs: CBC:  Recent Labs Lab 09/02/16 1643 09/05/16 1450 09/06/16 0606  WBC 12.1* 9.8 7.1  NEUTROABS 8,107* 6.9  --   HGB 12.7 12.9 10.5*  HCT 38.1 39.2 32.9*  MCV 87.8 88.7 88.9  PLT 213 259 124   Basic Metabolic Panel:  Recent Labs Lab 09/05/16 1450 09/06/16 0606  NA 136 138  K 4.5 3.9  CL 99* 104  CO2 27 26  GLUCOSE 90 100*  BUN 15 9  CREATININE 0.76 0.65  CALCIUM 9.5 8.6*   Liver Function Tests:  Recent Labs Lab 09/05/16 1450 09/06/16 0606  AST 21 15  ALT 21 18  ALKPHOS 102 80  BILITOT 0.5 0.5  PROT 7.5 5.7*  ALBUMIN 3.3* 2.6*   Urinalysis    Component Value Date/Time   COLORURINE YELLOW 09/05/2016 1645   APPEARANCEUR CLEAR 09/05/2016 1645   LABSPEC 1.019 09/05/2016 1645   PHURINE 6.0 09/05/2016 1645   GLUCOSEU NEGATIVE 09/05/2016 1645    HGBUR NEGATIVE 09/05/2016 1645   BILIRUBINUR NEGATIVE 09/05/2016 1645   BILIRUBINUR negative 02/01/2016 1520  KETONESUR NEGATIVE 09/05/2016 1645   PROTEINUR NEGATIVE 09/05/2016 1645   UROBILINOGEN 0.2 02/01/2016 1520   NITRITE NEGATIVE 09/05/2016 1645   LEUKOCYTESUR NEGATIVE 09/05/2016 1645      Time coordinating discharge: Over 30 minutes  SIGNED:  Vernell Leep, MD, FACP, [place]. [facility] Pager [phone]  If 7PM-7AM, please contact night-coverage [email or web address] Password [email or web address] 09/08/2016, 4:18 PM

## 2016-09-08 NOTE — Care Management Note (Signed)
Case Management Note  Patient Details  Name: Jane Li MRN: [id number] Date of Birth: 01/21/1971  Subjective/Objective:          Pt admitted with enterococcal bacteremia. From home with husband. Independent with ADL's and no DME usage PTA.   Vania ReaKevin M Gaertner (Spouse)     [phone]       PCP: Kerby NoraAmy Bedsole  Action/Plan: Pt will need IV home infusion, 4 wks of IV ampicillin. Once PICC placed the plan is to d/c to home with home health services (RN).   Expected Discharge Date:                  Expected Discharge Plan:  Home w Home Health Services  In-House Referral:     Discharge planning Services  CM Consult  Post Acute Care Choice:  Durable Medical Equipment Choice offered to:  Patient  DME Arranged:  IV pump/equipment DME Agency:  [facility] (referral made with Pam for IV/ABX home infusion)/ MD aware to complete OPAT consult.  HH Arranged:  RN HH Agency:  [facility] (referral made with Lupita LeashDonna @ [phone])  Status of Service:  Completed, signed off  If discussed at Long Length of Stay Meetings, dates discussed:    Additional Comments:  Epifanio LeschesCole, Cameshia Hudson, RN 09/08/2016, 12:37 PM

## 2016-09-08 NOTE — Progress Notes (Signed)
Spoke to the nurse, encouraged him to contact IR for PICC placement otherwise the PICC will not be placed until 09/09/16. Consuello Masseimmons, Esti Demello M

## 2016-09-08 NOTE — Progress Notes (Signed)
PHARMACY CONSULT NOTE FOR:  OUTPATIENT  PARENTERAL ANTIBIOTIC THERAPY (OPAT)  Indication: Enterococcus bacteremia Regimen: Ampicillin 2 grams iv Q 4 hours End date: October 04, 2016  IV antibiotic discharge orders are pended. To discharging provider:  please sign these orders via discharge navigator,  Select New Orders & click on the button choice - Manage This Unsigned Work.     Thank you for allowing pharmacy to be a part of this patient's care.  Elwin Sleightowell, Reanne Nellums Kay 09/08/2016, 12:46 PM

## 2016-09-08 NOTE — Discharge Instructions (Addendum)
Bacteremia °Bacteremia is the presence of bacteria in the blood. When bacteria enter the bloodstream, they can cause a life-threatening reaction called sepsis, which is a medical emergency. Bacteremia can spread to other parts of the body, including the heart, joints, and brain. °What are the causes? °This condition is caused by bacteria that get into the blood. Bacteria can enter the blood: °· From a skin infection or a cut on your skin. °· During an episode of pneumonia. °· From an infection in your stomach or intestine (gastrointestinal infection). °· From an infection in your bladder or urinary system (urinary tract infection). °· During a dental or medical procedure. °· After you brush your teeth so hard that your gums bleed. °· When a bacterial infection in another part of the body spreads to the blood. °· Through a dirty needle. ° °What increases the risk? °This condition is more likely to develop in: °· Children. °· Elderly adults. °· People who have a long-lasting (chronic) disease or medical condition. °· People who have an artificial joint or heart valve. °· People who have heart valve disease. °· People who have a tube, such as a catheter or IV tube, that has been inserted for a medical treatment. °· People who have a weak body defense system (immune system). °· People who use IV drugs. ° °What are the signs or symptoms? °Symptoms of this condition include: °· Fever. °· Chills. °· A racing heart. °· Shortness of breath. °· Dizziness. °· Weakness. °· Confusion. °· Nausea or vomiting. °· Diarrhea. ° °In some cases, there are no symptoms. Bacteremia that has spread to the other parts of the body may cause symptoms in those areas. °How is this diagnosed? °This condition may be diagnosed with a physical exam and tests, such as: °· A complete blood count (CBC). This test looks for signs of infection. °· Blood cultures. These look for bacteria in your blood. °· Tests of any tubes that you may have inserted into  your body, such as an IV tube or urinary catheter. These tests look for a source of infection. °· Urine tests including urine cultures. These look for bacteria in the urine that could be a source of infection. °· Imaging tests, such as an X-ray, CT scan, MRI, or heart ultrasound. These look for a source of infection in other parts of the body, such as the lungs, heart valves, or joints. ° °How is this treated? °This condition may be treated with: °· Antibiotic medicines given through an IV infusion. Depending on the source of infection, antibiotics may be needed for several weeks. At first, an antibiotic may be given to kill most types of blood bacteria. If your test results show that a certain kind of bacteria is causing problems, the antibiotic may be changed to kill only the bacteria that are causing problems. °· Antibiotics taken by mouth. °· IV fluids to support the body as you fight the infection. °· Removing any catheter or device that could be a source of infection. °· Blood pressure and breathing support, if you have sepsis. °· Surgery to control the source or spread of infection, such as: °? Removing an infected implanted device. °? Removing infected tissue or an abscess. ° °This condition is usually treated at a hospital. If you are treated at home, you may need to come back for medicines, blood tests, and evaluation. This is important. °Follow these instructions at home: °· Take over-the-counter and prescription medicines only as told by your health care provider. °·   If you were prescribed an antibiotic, take it as told by your health care provider. Do not stop taking the antibiotic even if you start to feel better.  Rest until your condition is under control.  Drink enough fluid to keep your urine clear or pale yellow.  Do not smoke. If you need help quitting, ask your health care provider.  Keep all follow-up visits as told by your health care provider. This is important. How is this  prevented?  Get the vaccinations that your health care provider recommends.  Clean and cover any scrapes or cuts.  Take good care of your skin. This includes regular bathing and moisturizing.  Wash your hands often.  Practice good oral hygiene. Brush your teeth two times a day and floss regularly. Get help right away if:  You have pain.  You have a fever.  You have trouble breathing.  Your skin becomes blotchy, pale, or clammy.  You develop confusion, dizziness, or weakness.  You develop diarrhea.  You develop any new symptoms after treatment. Summary  Bacteremia is the presence of bacteria in the blood. When bacteria enter the bloodstream, they can cause a life- threatening reaction called sepsis.  Children and elderly adults are at increased risk of bacteremia. Other risk factors include having a long-lasting (chronic) disease or a weak immune system, having an artificial joint or heart valve, having heart valve disease, having tubes that were inserted in the body for medical treatment, or using IV drugs.  Some symptoms of bacteremia include fever, chills, shortness of breath, confusion, nausea or vomiting, and diarrhea.  Tests may be done to diagnose a source of infection that led to bacteremia. These tests may include blood tests, urine tests, and imaging tests.  Bacteremia is usually treated with antibiotics, usually in a hospital. This information is not intended to replace advice given to you by your health care provider. Make sure you discuss any questions you have with your health care provider. Document Released: 01/09/2006 Document Revised: 02/23/2016 Document Reviewed: 02/23/2016 Elsevier Interactive Patient Education  2018 [employer] PICC Home Guide A peripherally inserted central catheter (PICC) is a long, thin, flexible tube that is inserted into a vein in the upper arm. It is a form of intravenous (IV) access. It is considered to be a "central" line  because the tip of the PICC ends in a large vein in your chest. This large vein is called the superior vena cava (SVC). The PICC tip ends in the SVC because there is a lot of blood flow in the SVC. This allows medicines and IV fluids to be quickly distributed throughout the body. The PICC is inserted using a sterile technique by a specially trained nurse or physician. After the PICC is inserted, a chest X-ray exam is done to be sure it is in the correct place. A PICC may be placed for different reasons, such as:  To give medicines and liquid nutrition that can only be given through a central line. Examples are: ? Certain antibiotic treatments. ? Chemotherapy. ? Total parenteral nutrition (TPN).  To take frequent blood samples.  To give IV fluids and blood products.  If there is difficulty placing a peripheral intravenous (PIV) catheter.  If taken care of properly, a PICC can remain in place for several months. A PICC can also allow a person to go home from the hospital early. Medicine and PICC care can be managed at home by a family member or home health care team. What problems  can happen when I have a PICC? Problems with a PICC can occasionally occur. These may include the following:  A blood clot (thrombus) forming in or at the tip of the PICC. This can cause the PICC to become clogged. A clot-dissolving medicine called tissue plasminogen activator (tPA) can be given through the PICC to help break up the clot.  Inflammation of the vein (phlebitis) in which the PICC is placed. Signs of inflammation may include redness, pain at the insertion site, red streaks, or being able to feel a "cord" in the vein where the PICC is located.  Infection in the PICC or at the insertion site. Signs of infection may include fever, chills, redness, swelling, or pus drainage from the PICC insertion site.  PICC movement (malposition). The PICC tip may move from its original position due to excessive physical  activity, forceful coughing, sneezing, or vomiting.  A break or cut in the PICC. It is important to not use scissors near the PICC.  Nerve or tendon irritation or injury during PICC insertion.  What should I keep in mind about activities when I have a PICC?  You may bend your arm and move it freely. If your PICC is near or at the bend of your elbow, avoid activity with repeated motion at the elbow.  Rest at home for the remainder of the day following PICC line insertion.  Avoid lifting heavy objects as instructed by your health care provider.  Avoid using a crutch with the arm on the same side as your PICC. You may need to use a walker. What should I know about my PICC dressing?  Keep your PICC bandage (dressing) clean and dry to prevent infection. ? Ask your health care provider when you may shower. Ask your health care provider to teach you how to wrap the PICC when you do take a shower.  Change the PICC dressing as instructed by your health care provider.  Change your PICC dressing if it becomes loose or wet. What should I know about PICC care?  Check the PICC insertion site daily for leakage, redness, swelling, or pain.  Do not take a bath, swim, or use hot tubs when you have a PICC. Cover PICC line with clear plastic wrap and tape to keep it dry while showering.  Flush the PICC as directed by your health care provider. Let your health care provider know right away if the PICC is difficult to flush or does not flush. Do not use force to flush the PICC.  Do not use a syringe that is less than 10 mL to flush the PICC.  Never pull or tug on the PICC.  Avoid blood pressure checks on the arm with the PICC.  Keep your PICC identification card with you at all times.  Do not take the PICC out yourself. Only a trained clinical professional should remove the PICC. Get help right away if:  Your PICC is accidentally pulled all the way out. If this happens, cover the insertion site  with a bandage or gauze dressing. Do not throw the PICC away. Your health care provider will need to inspect it.  Your PICC was tugged or pulled and has partially come out. Do not  push the PICC back in.  There is any type of drainage, redness, or swelling where the PICC enters the skin.  You cannot flush the PICC, it is difficult to flush, or the PICC leaks around the insertion site when it is flushed.  You hear a "flushing" sound when the PICC is flushed.  You have pain, discomfort, or numbness in your arm, shoulder, or jaw on the same side as the PICC.  You feel your heart "racing" or skipping beats.  You notice a hole or tear in the PICC.  You develop chills or a fever. This information is not intended to replace advice given to you by your health care provider. Make sure you discuss any questions you have with your health care provider. Document Released: 10/02/2002 Document Revised: 10/16/2015 Document Reviewed: 01/18/2013 Elsevier Interactive Patient Education  2017 [employer]  Please get your medications reviewed and adjusted by your Primary MD.  Please request your Primary MD to go over all Hospital Tests and Procedure/Radiological results at the follow up, please get all Hospital records sent to your Prim MD by signing hospital release before you go home.  If you had Pneumonia of Lung problems at the Hospital: Please get a 2 view Chest X ray done in 6-8 weeks after hospital discharge or sooner if instructed by your Primary MD.  If you have Congestive Heart Failure: Please call your Cardiologist or Primary MD anytime you have any of the following symptoms:  1) 3 pound weight gain in 24 hours or 5 pounds in 1 week  2) shortness of breath, with or without a dry hacking cough  3) swelling in the hands, feet or stomach  4) if you have to sleep on extra pillows at night in order to breathe  Follow cardiac low salt diet and 1.5 lit/day fluid restriction.  If you have  diabetes Accuchecks 4 times/day, Once in AM empty stomach and then before each meal. Log in all results and show them to your primary doctor at your next visit. If any glucose reading is under 80 or above 300 call your primary MD immediately.  If you have Seizure/Convulsions/Epilepsy: Please do not drive, operate heavy machinery, participate in activities at heights or participate in high speed sports until you have seen by Primary MD or a Neurologist and advised to do so again.  If you had Gastrointestinal Bleeding: Please ask your Primary MD to check a complete blood count within one week of discharge or at your next visit. Your endoscopic/colonoscopic biopsies that are pending at the time of discharge, will also need to followed by your Primary MD.  Get Medicines reviewed and adjusted. Please take all your medications with you for your next visit with your Primary MD  Please request your Primary MD to go over all hospital tests and procedure/radiological results at the follow up, please ask your Primary MD to get all Hospital records sent to his/her office.  If you experience worsening of your admission symptoms, develop shortness of breath, life threatening emergency, suicidal or homicidal thoughts you must seek medical attention immediately by calling 911 or calling your MD immediately  if symptoms less severe.  You must read complete instructions/literature along with all the possible adverse reactions/side effects for all the Medicines you take and that have been prescribed to you. Take any new Medicines after you have completely understood and accpet all the possible adverse reactions/side effects.   Do not drive or operate heavy machinery when taking Pain medications.   Do not take more than prescribed Pain, Sleep and Anxiety Medications  Special Instructions: If you have smoked or chewed Tobacco  in the last 2 yrs please stop smoking, stop any regular Alcohol  and or any  Recreational drug use.  Wear Seat belts while driving.  Please note You were cared for by a hospitalist during your hospital stay. If you have any questions about your discharge medications or the care you received while you were in the hospital after you are discharged, you can call the unit and asked to speak with the hospitalist on call if the hospitalist that took care of you is not available. Once you are discharged, your primary care physician will handle any further medical issues. Please note that NO REFILLS for any discharge medications will be authorized once you are discharged, as it is imperative that you return to your primary care physician (or establish a relationship with a primary care physician if you do not have one) for your aftercare needs so that they can reassess your need for medications and monitor your lab values.  You can reach the hospitalist office at phone [phone] or fax [phone]   If you do not have a primary care physician, you can call [phone] for a physician referral.

## 2016-09-09 DIAGNOSIS — A4181 Sepsis due to Enterococcus: Secondary | ICD-10-CM | POA: Diagnosis not present

## 2016-09-09 DIAGNOSIS — Z5181 Encounter for therapeutic drug level monitoring: Secondary | ICD-10-CM | POA: Diagnosis not present

## 2016-09-09 DIAGNOSIS — Z452 Encounter for adjustment and management of vascular access device: Secondary | ICD-10-CM | POA: Diagnosis not present

## 2016-09-09 DIAGNOSIS — I10 Essential (primary) hypertension: Secondary | ICD-10-CM | POA: Diagnosis not present

## 2016-09-09 LAB — CBC
HEMATOCRIT: 32.8 % — AB (ref 36.0–46.0)
Hemoglobin: 10.4 g/dL — ABNORMAL LOW (ref 12.0–15.0)
MCH: 28.4 pg (ref 26.0–34.0)
MCHC: 31.7 g/dL (ref 30.0–36.0)
MCV: 89.6 fL (ref 78.0–100.0)
Platelets: 339 10*3/uL (ref 150–400)
RBC: 3.66 MIL/uL — ABNORMAL LOW (ref 3.87–5.11)
RDW: 14.7 % (ref 11.5–15.5)
WBC: 6.9 10*3/uL (ref 4.0–10.5)

## 2016-09-09 NOTE — Progress Notes (Signed)
[facility]    Date of Admission:  09/05/2016   Total days of antibiotics 4 of ampicillin   ID: Jane Li is a 46 y.o. female with enterococcal bacteremia.   Active Problems:   BMI 37.0-37.9, adult   Migraine headache without aura   GERD   HTN (hypertension)   Bacteremia    Subjective: Feeling better. Ankle/knee pain improved with ibuprofen. Tender with firm pressure. Ready to go home.   Medications:  . enoxaparin (LOVENOX) injection  50 mg Subcutaneous Q24H  . hydrochlorothiazide  12.5 mg Oral Daily  . lisinopril  20 mg Oral Daily  . pantoprazole  40 mg Oral Daily  . triamcinolone  2 spray Nasal QPM  . venlafaxine XR  75 mg Oral QHS    Objective: Vital signs in last 24 hours: Temp:  [98 F (36.7 C)] 98 F (36.7 C) (06/01 0555) Pulse Rate:  [91-92] 92 (06/01 0555) Resp:  [17-19] 19 (06/01 0555) BP: (117-138)/(65-86) 138/86 (06/01 0913) SpO2:  [95 %-97 %] 97 % (06/01 0555)   Constitutional:  oriented to person, place, and time. appears well-developed and well-nourished. No distress.  HENT: [place]/AT, PERRLA, no scleral icterus Mouth/Throat: Oropharynx is clear and moist. No oropharyngeal exudate.  Cardiovascular: Normal rate, regular rhythm and normal heart sounds. Exam reveals no gallop and no friction rub.  No murmur heard.  Pulmonary/Chest: Effort normal and breath sounds normal. No respiratory distress.  has no wheezes.  Neck = supple, no nuchal rigidity Abdominal: Soft. Bowel sounds are normal.  exhibits no distension. There is no tenderness.  Lymphadenopathy: no cervical adenopathy. No axillary adenopathy Neurological: alert and oriented to person, place, and time.  Ext: right knee is benign, no effusion, no warmth, no erythema, full ROM Skin: Skin is warm and dry. No rash noted. No erythema.  Psychiatric: a normal mood and affect.  behavior is normal.   Lab Results  Recent Labs  09/09/16 0344  WBC 6.9  HGB 10.4*  HCT  32.8*   Liver Panel No results for input(s): PROT, ALBUMIN, AST, ALT, ALKPHOS, BILITOT, BILIDIR, IBILI in the last 72 hours. Sedimentation Rate No results for input(s): ESRSEDRATE in the last 72 hours. C-Reactive Protein No results for input(s): CRP in the last 72 hours.  Microbiology:  Studies/Results: Mr Ankle Right W Wo Contrast  Result Date: 09/07/2016 CLINICAL DATA:  Pain and tenderness and swelling of the right ankle. EXAM: MRI OF THE RIGHT ANKLE WITHOUT AND WITH CONTRAST TECHNIQUE: Multiplanar, multisequence MR imaging of the ankle was performed before and after the administration of intravenous contrast. CONTRAST:  17m MULTIHANCE GADOBENATE DIMEGLUMINE 529 MG/ML IV SOLN COMPARISON:  Radiographs dated 08/16/2016 FINDINGS: Soft tissues: There is inflammation in the soft tissues at the posteromedial aspect of the ankle extending onto the plantar aspect of the foot medial to the calcaneus. There are thrombosed veins in the plantar aspect of the foot best seen on series 13. TENDONS Peroneal: Intact peroneus longus and peroneus brevis tendons. Posteromedial: Small amount of fluid in the sheath of the posterior tibialis tendon just distal to the tip of the medial malleolus. No enhancement after contrast administration. Anterior: Intact tibialis anterior, extensor hallucis longus and extensor digitorum longus tendons. Achilles: Normal. Plantar Fascia: Focal degenerative changes of the origin of the medial band. LIGAMENTS Lateral: Intact. Medial: Intact. CARTILAGE Ankle Joint: No joint effusion or chondral defect. Subtalar Joints/Sinus Tarsi: No joint effusion or chondral defect. Bones: Plantar calcaneal spur appear minimal dorsal spurring  on the navicular. IMPRESSION: IMPRESSION Thrombophlebitis of the posteromedial aspect of the ankle and foot. Electronically Signed   By: Lorriane Shire M.D.   On: 09/07/2016 12:30     Assessment/Plan: Enterococcal bacteremia complicated by thrombophlebitis of  right ankle =  - BCx 5/29 >> NG x 2d 2/2 sets  - No changes to plan. Continue ampicillin x 4 weeks for suspected septic thrombophlebitis  - PICC line in place   Right Ankle/Knee Pain = - MRI unremarkable for joint, tendon or ligament abnormality. Only finding is thrombophlebitis of posteriomedial aspect.  - Doppler US - negative for DVT or bakers cyst    Discharge Planning = CM involved in care for home IV therapy and [facility] will provide services.   [facility] Orders -  Diagnosis: Enterococcal Bacteremia   Culture Result: Enterococcus 5/25 BC  Allergies  Allergen Reactions  . Codeine Nausea And Vomiting    Discharge antibiotics: Ampicillin 2 gm IV q4h   Duration: 4 weeks  End Date: June 26th   [place] Per Protocol:  Labs weekly while on IV antibiotics: _x_ CBC with differential _x_ BMP __ CMP __ CRP __ ESR __ Vancomycin trough  _x_ Please pull PIC at completion of IV antibiotics __ Please leave PIC in place until doctor has seen patient or been notified  Fax weekly labs to [phone]  Clinic Follow Up Appt: 3-4 weeks  @ [facility] with Dr. Baxter Flattery.    [name], MSN, NP-C [facility] [place] Cell: [phone] Pager: [phone]  09/09/2016, 10:24 AM

## 2016-09-09 NOTE — Care Management Note (Signed)
Case Management Note  Patient Details  Name: Jane Li MRN: [id number] Date of Birth: 11/26/1970  Subjective/Objective:          Admitted with enterococcal bactermia.          Action/Plan: Plan is to d/c today with home health services.  Expected Discharge Date:  09/09/16               Expected Discharge Plan:  Home w Home Health Services  In-House Referral:     Discharge planning Services  CM Consult  Post Acute Care Choice:  Durable Medical Equipment Choice offered to:  Patient  DME Arranged:  IV pump/equipment DME Agency:  [facility] (referral made with Pam for IV/ABX home infusion)  HH Arranged:  RN HH Agency:  [facility] (referral made with Lupita LeashDonna @ [phone])  Status of Service:  Completed, signed off  If discussed at Long Length of Stay Meetings, dates discussed:    Additional Comments:  Epifanio LeschesCole, Qianna Hudson, RN 09/09/2016, 11:27 AM

## 2016-09-09 NOTE — Progress Notes (Signed)
Progress note  Patient was supposed to discharge on 09/08/16 but home health services would not have been able to assist with IV antibiotics that day. Thereby she stayed additional day in the hospital. Jane SiresInterviewed and examined this morning. She states that her pain in the left leg/foot is better than yesterday after ibuprofen that she took for chronic low back pain. She had questions about "small clots" noted on MRI of the ankle. I reviewed the MRI with the reading radiologist today who indicated that these small clots were in the plantar aspect of the foot and rest of her deep vein system was negative for clots. Recommended supportive treatment with NSAIDs for thrombophlebitis and IV antibiotics for bacteremia and possible septic thrombophlebitis. No indication for anticoagulation. This was discussed in detail with patient who verbalized understanding. She was advised to seek immediate medical attention for any kind of deterioration.  She was comfortable and appeared in no obvious distress. Vital signs and physical exam were stable and unchanged compared to yesterday. Hemoglobin stable. Patient did not know that she had anemia. She is menopausal. She denies any bleeding history. She is not a vegetarian. Advised her to follow-up with her PCP regarding follow-up and evaluation of anemia as deemed necessary. She received her noon dose of IV antibiotics and then was discharged home in stable condition.  Marcellus ScottHONGALGI,Shaquela Weichert, MD, FACP, FHM. [facility] Pager [phone]  If 7PM-7AM, please contact night-coverage [email or web address] Password [email or web address] 09/09/2016, 1:36 PM

## 2016-09-10 LAB — CULTURE, BLOOD (ROUTINE X 2)
Culture: NO GROWTH
Special Requests: ADEQUATE

## 2016-09-11 LAB — CULTURE, BLOOD (ROUTINE X 2)
CULTURE: NO GROWTH
Culture: NO GROWTH
Special Requests: ADEQUATE
Special Requests: ADEQUATE

## 2016-09-16 ENCOUNTER — Ambulatory Visit (INDEPENDENT_AMBULATORY_CARE_PROVIDER_SITE_OTHER): Payer: BC Managed Care – PPO | Admitting: Family Medicine

## 2016-09-16 ENCOUNTER — Encounter: Payer: Self-pay | Admitting: Family Medicine

## 2016-09-16 VITALS — BP 110/64 | HR 95 | Temp 98.5°F | Ht 62.0 in | Wt 215.5 lb

## 2016-09-16 DIAGNOSIS — I809 Phlebitis and thrombophlebitis of unspecified site: Secondary | ICD-10-CM | POA: Insufficient documentation

## 2016-09-16 DIAGNOSIS — Z95828 Presence of other vascular implants and grafts: Secondary | ICD-10-CM | POA: Insufficient documentation

## 2016-09-16 DIAGNOSIS — M25571 Pain in right ankle and joints of right foot: Secondary | ICD-10-CM

## 2016-09-16 DIAGNOSIS — M545 Low back pain, unspecified: Secondary | ICD-10-CM | POA: Insufficient documentation

## 2016-09-16 DIAGNOSIS — M79604 Pain in right leg: Secondary | ICD-10-CM

## 2016-09-16 DIAGNOSIS — Z8739 Personal history of other diseases of the musculoskeletal system and connective tissue: Secondary | ICD-10-CM

## 2016-09-16 DIAGNOSIS — R42 Dizziness and giddiness: Secondary | ICD-10-CM | POA: Diagnosis not present

## 2016-09-16 DIAGNOSIS — I10 Essential (primary) hypertension: Secondary | ICD-10-CM | POA: Diagnosis not present

## 2016-09-16 DIAGNOSIS — B952 Enterococcus as the cause of diseases classified elsewhere: Secondary | ICD-10-CM

## 2016-09-16 DIAGNOSIS — R7881 Bacteremia: Secondary | ICD-10-CM

## 2016-09-16 DIAGNOSIS — K219 Gastro-esophageal reflux disease without esophagitis: Secondary | ICD-10-CM | POA: Diagnosis not present

## 2016-09-16 DIAGNOSIS — D649 Anemia, unspecified: Secondary | ICD-10-CM | POA: Diagnosis not present

## 2016-09-16 MED ORDER — LISINOPRIL-HYDROCHLOROTHIAZIDE 20-12.5 MG PO TABS: 1.0000 | ORAL_TABLET | Freq: Every day | ORAL | 3 refills | Status: DC

## 2016-09-16 MED ORDER — MELOXICAM 15 MG PO TABS: 15.0000 mg | ORAL_TABLET | Freq: Every day | ORAL | 0 refills | Status: DC

## 2016-09-16 NOTE — Assessment & Plan Note (Signed)
ON 1 month ampicillin via PICC line. Final culture blood negative.  Will get weekly cbc, BMET from Home health.. results not yet received.

## 2016-09-16 NOTE — Patient Instructions (Addendum)
Stop ibuprofen/ aleve. Can try meloxicam for pain and inflammation.  Start heat on low back, start massage, gentle stretching. Continue probiotics.  Home vertigo desensitization exercises.  Continue nasacort 2 spray per nostril daily. We will call with lab results.

## 2016-09-16 NOTE — Assessment & Plan Note (Signed)
Well controlled. Continue current medication.  

## 2016-09-16 NOTE — Assessment & Plan Note (Signed)
MSK strain.  treat with meloxicam, massage, heat and home PT.

## 2016-09-16 NOTE — Progress Notes (Signed)
Subjective:    Patient ID: Jane Li, female    DOB: [birth year], 46 y.o.   MRN: [id number]  HPI   46 year old female presents for follow up hospitalization for enterococcus bacteremia. She was admitted on 5/28 to 09/09/2016.  Possible septic thrombophlebitis as source of infection in right ankle.  Now on ampicillin IV via PICC 2 g every 4 hours until 10/04/2016  TEE 5/30 neg without vegetations Right Lower extremity venous Dopplers without DVT or SVT. MRI ankle: thrombophlebitis Has PICC line in place. Cbc and BMP weekly: pending, last CBC 6/1: wbc 6.9, hg 10.4, last  CMET 5/29: nml LFTs, Cr, Ca 8.6 Final  surveillance blood cultures: 5/28, 5/29: no growth x 5 days  Has follow up with ID Dr. Judyann Munson in 3-4 weeks.  Today 09/16/16 she reports: No further fever, no ankle or leg pain, no redness or swelling.  No SE to antibiotics except mild reflux.  She has been eating and drinking fluids well .  She is having mild constipation.  She was found to be  anemic, no blood loss.  Had history of mild anemia as child.. Not sure why.  She has had  Increase in low back pain with lying in hospital bed. No weakness, no numbness. Doing chiropractor and stretching. Rx for massage given.   No blood in urine, no dysuria.  She has been using ibuprofen 400 mg three times daily. Tramadol/hydrocodone does not help as much.  Needs refill of lisinopril. BP Readings from Last 3 Encounters:  09/16/16 110/64  09/09/16 138/86  09/02/16 90/64    Review of Systems  Constitutional: Negative for fatigue and fever.  HENT: Negative for ear pain.   Eyes: Negative for pain.  Respiratory: Negative for chest tightness and shortness of breath.   Cardiovascular: Negative for chest pain, palpitations and leg swelling.  Gastrointestinal: Negative for abdominal pain.  Genitourinary: Negative for dysuria.       Objective:   Physical Exam  Constitutional: Vital signs are normal. She appears  well-developed and well-nourished. She is cooperative.  Non-toxic appearance. She does not appear ill. No distress.  HENT:  Head: Normocephalic.  Right Ear: Hearing, tympanic membrane, external ear and ear canal normal. Tympanic membrane is not erythematous, not retracted and not bulging.  Left Ear: Hearing, tympanic membrane, external ear and ear canal normal. Tympanic membrane is not erythematous, not retracted and not bulging.  Nose: No mucosal edema or rhinorrhea. Right sinus exhibits no maxillary sinus tenderness and no frontal sinus tenderness. Left sinus exhibits no maxillary sinus tenderness and no frontal sinus tenderness.  Mouth/Throat: Uvula is midline, oropharynx is clear and moist and mucous membranes are normal.  Eyes: Conjunctivae, EOM and lids are normal. Pupils are equal, round, and reactive to light. Lids are everted and swept, no foreign bodies found.  Neck: Trachea normal and normal range of motion. Neck supple. Carotid bruit is not present. No thyroid mass and no thyromegaly present.  Cardiovascular: Normal rate, regular rhythm, S1 normal, S2 normal, normal heart sounds, intact distal pulses and normal pulses.  Exam reveals no gallop and no friction rub.   No murmur heard. Pulmonary/Chest: Effort normal and breath sounds normal. No tachypnea. No respiratory distress. She has no decreased breath sounds. She has no wheezes. She has no rhonchi. She has no rales.  Abdominal: Soft. Normal appearance and bowel sounds are normal. There is no splenomegaly or hepatomegaly. There is no tenderness. There is no CVA tenderness.  Musculoskeletal:  Lumbar back: She exhibits tenderness. She exhibits normal range of motion and no bony tenderness.  Neurological: She is alert. No sensory deficit. Coordination and gait normal.  Skin: Skin is warm, dry and intact. No rash noted.  PICC line in place on right upper arm, no surrounding erythema or pain.  Resolved rash on right posterior thigh  and only mild discoloration/hyperpigmentation in right ankle.  Psychiatric: Her speech is normal and behavior is normal. Judgment and thought content normal. Her mood appears not anxious. Cognition and memory are normal. She does not exhibit a depressed mood.          Assessment & Plan:

## 2016-09-16 NOTE — Assessment & Plan Note (Signed)
Resolved

## 2016-09-16 NOTE — Assessment & Plan Note (Signed)
Uncomplicated. 

## 2016-09-16 NOTE — Assessment & Plan Note (Signed)
Due to increase use of NSAIDs.. Try change to meloxicam and continue PPI.

## 2016-09-16 NOTE — Assessment & Plan Note (Signed)
Not clearly associated with medication. Most consistent with BPPV or inner ear source. Slight effusion in right TM.Marland Kitchen. Continue nasal steroid.  Start home desensitization for possible BPPV.

## 2016-09-16 NOTE — Assessment & Plan Note (Signed)
Unknown cause. New. No blood loss.  IF continued on recent cbc ( obtain results) then consider work up with B12, iron panel etc.

## 2016-09-16 NOTE — Assessment & Plan Note (Signed)
Resolving.. Pain resolved. Most likely the source of pt enteroccus bacteremia.

## 2016-09-20 ENCOUNTER — Telehealth: Payer: Self-pay | Admitting: *Deleted

## 2016-09-20 NOTE — Telephone Encounter (Signed)
Patient called Jane Li and left a voice mail requesting an earlier hospital follow up appt with Dr. Drue SecondSnider. She is coming in on 10/17/16 and her IV antibiotic stop date is 10/04/16. She is going to the beach on 09[place]24/16 through 10/01/16 and was hoping to end her meds early. She was also concerned that she would have the picc line for almost two weeks after the stop date. Explained to patient that Dr. Drue SecondSnider will be monitoring her labs and will be making a decision on when to pull her picc line based on this. The patient has already spoken to [facility] and she will be instructed on how to mix her IV medication to administer while out of town. If she develops any issues with the picc line or her meds she will need to go to the ED for evaluation. She is aware of this and will plan to keep her appointment on 10/17/16.

## 2016-09-22 ENCOUNTER — Telehealth: Payer: Self-pay | Admitting: Family Medicine

## 2016-09-22 NOTE — Telephone Encounter (Signed)
Notify pt reviewed this weeks cbc and BMET.Marland Kitchen. Kidney function nml, potassium slightly low... Increase potassium containing foods. Whit blood cells down from 8 , now at 6 in nml range.  Anemia present will continue to follow.  Please ask HH to add iron panel and ferritin to net labs draw.. Let me know if I need to write an rx for this.

## 2016-09-23 NOTE — Telephone Encounter (Signed)
Spoke with [facility] and gave verbal order to add a iron panel and ferritin to next blood draw.

## 2016-09-23 NOTE — Telephone Encounter (Signed)
[place] notified as instructed by telephone. 

## 2016-10-04 ENCOUNTER — Encounter: Payer: Self-pay | Admitting: Family Medicine

## 2016-10-05 ENCOUNTER — Telehealth: Payer: Self-pay | Admitting: *Deleted

## 2016-10-05 NOTE — Telephone Encounter (Signed)
Alena RN from [facility] called to make sure it is ok to D/C the patient PICC. The original stop date was 10/04/16 and she is to pull it today and just checking prior to doing this. Advised not fimilar with the patient and will ask the provider and pharmacy and call her back.

## 2016-10-13 ENCOUNTER — Other Ambulatory Visit: Payer: Self-pay | Admitting: Family Medicine

## 2016-10-13 NOTE — Telephone Encounter (Signed)
Last office visit 09/16/16.  Last refilled 06/08/018 for #30 with no refills.  Ok to refill?

## 2016-10-14 ENCOUNTER — Telehealth: Payer: Self-pay | Admitting: Family Medicine

## 2016-10-14 MED ORDER — POTASSIUM CHLORIDE CRYS ER 20 MEQ PO TBCR
20.0000 meq | EXTENDED_RELEASE_TABLET | Freq: Every day | ORAL | 0 refills | Status: DC
Start: 2016-10-14 — End: 2016-10-19

## 2016-10-14 NOTE — Telephone Encounter (Signed)
[facility] [place].

## 2016-10-14 NOTE — Telephone Encounter (Signed)
Labs placed in Dr. Daphine DeutscherBedsole's in box for review.

## 2016-10-14 NOTE — Telephone Encounter (Signed)
Home Health did lab work on 10/03/16.  Patient said she hasn't received the results yet, specifically the iron levels.  Please call patient.

## 2016-10-14 NOTE — Telephone Encounter (Signed)
I have not receive labs yet either.. Please call HH.

## 2016-10-14 NOTE — Telephone Encounter (Signed)
Notify pt anemia is resolved and iron is in nml range. Nml kidney function.  Potassium remains slightly low.. Is she taking a supplement? If not  Call in Kdur 20 meq daily x 3 days.  #3 0RF.  White blood cell count nml.   She should have appt Monday with ID MD.

## 2016-10-14 NOTE — Telephone Encounter (Signed)
Marylene Landngela notified as instructed by telephone.  She is not taking any supplements.  K-Dur prescription sent into [facility] [address] as instructed by Dr. Ermalene SearingBedsole.

## 2016-10-17 ENCOUNTER — Inpatient Hospital Stay: Payer: BC Managed Care – PPO | Admitting: Internal Medicine

## 2016-10-19 ENCOUNTER — Ambulatory Visit (INDEPENDENT_AMBULATORY_CARE_PROVIDER_SITE_OTHER): Payer: BC Managed Care – PPO | Admitting: Internal Medicine

## 2016-10-19 ENCOUNTER — Encounter: Payer: Self-pay | Admitting: Internal Medicine

## 2016-10-19 VITALS — BP 114/77 | HR 108 | Wt 213.0 lb

## 2016-10-19 DIAGNOSIS — B952 Enterococcus as the cause of diseases classified elsewhere: Secondary | ICD-10-CM

## 2016-10-19 DIAGNOSIS — R7881 Bacteremia: Secondary | ICD-10-CM | POA: Diagnosis not present

## 2016-10-21 NOTE — Progress Notes (Signed)
RFV: follow up for enterococcal bacteremia  Patient ID: Jane Li, female   DOB: June 04, 1970, 46 y.o.   MRN: [id number]  HPI Jane Li is a48yo F with hx of DJD, who was admitted for leg pain and enterococcal bacteremia. She was ruled out of endocarditis, but leg pain was thought to be thrombophlebitis. She was treated with 4 wk of Iv ampicillin without difficulty. She is feeling better. No fevers, chills, nightsweats, leg pain improved  Outpatient Encounter Prescriptions as of 10/19/2016  Medication Sig  . calcium carbonate (TUMS - DOSED IN MG ELEMENTAL CALCIUM) 500 MG chewable tablet Chew 1-2 tablets by mouth every 6 (six) hours as needed for indigestion or heartburn.   Marland Kitchen ibuprofen (ADVIL,MOTRIN) 200 MG tablet Take 600 mg by mouth every 6 (six) hours as needed for headache or mild pain.   Marland Kitchen lisinopril-hydrochlorothiazide (PRINZIDE,ZESTORETIC) 20-12.5 MG tablet Take 1 tablet by mouth daily.  . NON FORMULARY On Guard Beadlets: (Proprietary blend of [place], [place], [place], [place], and [place] contained in tiny vegetable beadlets) Take 2-3 beadlets by mouth once daily  . NON FORMULARY Essential Oil(s) Lemon Oil Capsules: Take 1 capsule by mouth one to two times a day  . NON FORMULARY Essential Oil(s) Oregano Capsules: Take 1 capsule by mouth one to two times a day  . NON FORMULARY Essential Oil(s) Grapefruit Capsules: Take 1 capsule by mouth one to two times a day  . nystatin-triamcinolone (MYCOLOG II) cream Apply to the affected areas three times a day as needed for irritation  . omeprazole (PRILOSEC) 40 MG capsule TAKE 1 CAPSULE (40 MG TOTAL) BY MOUTH DAILY.  . simethicone (GAS-X) 80 MG chewable tablet Chew 80 mg by mouth as needed for flatulence.   . SUMAtriptan (IMITREX) 100 MG tablet 1 tab po as needed for migraine. May repeat in 2 hours if headache persists or recurs. (Patient taking differently: Take 100 mg by mouth once as needed for migraine. And may repeat once in 2 hours  if headache persists or recurs)  . triamcinolone (NASACORT ALLERGY 24HR) 55 MCG/ACT AERO nasal inhaler Place 2 sprays into the nose every evening.   . venlafaxine XR (EFFEXOR-XR) 75 MG 24 hr capsule Take 75 mg by mouth at bedtime.   . [DISCONTINUED] ampicillin IVPB Inject 2 g into the vein every 4 (four) hours. Indication:  Enterococcus sepsis Last Day of Therapy:  October 04, 2016 Labs - Once weekly:  CBC/D and BMP, Labs - Every other week:  ESR and CRP  . [DISCONTINUED] meloxicam (MOBIC) 15 MG tablet TAKE 1 TABLET BY MOUTH EVERY DAY  . [DISCONTINUED] potassium chloride SA (K-DUR,KLOR-CON) 20 MEQ tablet Take 1 tablet (20 mEq total) by mouth daily.   No facility-administered encounter medications on file as of 10/19/2016.      Patient Active Problem List   Diagnosis Date Noted  . S/P PICC central line placement 09/16/2016  . Septic thrombophlebitis 09/16/2016  . Acute low back pain 09/16/2016  . Vertigo 09/16/2016  . Anemia 09/16/2016  . Bacteremia due to Enterococcus 09/05/2016  . Right leg pain 09/02/2016  . Ankle pain 08/10/2016  . GAD (generalized anxiety disorder) 02/19/2016  . Decreased libido 12/19/2014  . Helicobacter pylori gastritis 11/12/2013  . High cholesterol 02/01/2013  . HTN (hypertension) 04/13/2012  . GERD 07/30/2009  . BMI 37.0-37.9, adult 05/11/2007  . Migraine headache without aura 05/03/2007  . Allergic rhinitis 05/03/2007  . FATIGUE, CHRONIC 08/15/2006     Health Maintenance Due  Topic Date Due  .  HIV Screening  05/19/1983     Review of Systems Review of Systems  Constitutional: Negative for fever, chills, diaphoresis, activity change, appetite change, fatigue and unexpected weight change.  HENT: Negative for congestion, sore throat, rhinorrhea, sneezing, trouble swallowing and sinus pressure.  Eyes: Negative for photophobia and visual disturbance.  Respiratory: Negative for cough, chest tightness, shortness of breath, wheezing and stridor.    Cardiovascular: Negative for chest pain, palpitations and leg swelling.  Gastrointestinal: Negative for nausea, vomiting, abdominal pain, diarrhea, constipation, blood in stool, abdominal distention and anal bleeding.  Genitourinary: Negative for dysuria, hematuria, flank pain and difficulty urinating.  Musculoskeletal: +intermittent back pain that she attributes to her baseline DJD Skin: Negative for color change, pallor, rash and wound.  Neurological: Negative for dizziness, tremors, weakness and light-headedness.  Hematological: Negative for adenopathy. Does not bruise/bleed easily.  Psychiatric/Behavioral: Negative for behavioral problems, confusion, sleep disturbance, dysphoric mood, decreased concentration and agitation.    Physical Exam   BP 114/77   Pulse (!) 108   Wt 213 lb (96.6 kg)   BMI 38.96 kg/m   Physical Exam  Constitutional:  oriented to person, place, and time. appears well-developed and well-nourished. No distress.  HENT: [place]/AT, PERRLA, no scleral icterus Mouth/Throat: Oropharynx is clear and moist. No oropharyngeal exudate.  Skin: Skin is warm and dry. No rash noted. No erythema.no scarring from picc line Ext: trace edema on bilateral legs  Psychiatric: a normal mood and affect.  behavior is normal.   CBC Lab Results  Component Value Date   WBC 6.9 09/09/2016   RBC 3.66 (L) 09/09/2016   HGB 10.4 (L) 09/09/2016   HCT 32.8 (L) 09/09/2016   PLT 339 09/09/2016   MCV 89.6 09/09/2016   MCH 28.4 09/09/2016   MCHC 31.7 09/09/2016   RDW 14.7 09/09/2016   LYMPHSABS 2.2 09/05/2016   MONOABS 0.5 09/05/2016   EOSABS 0.1 09/05/2016    BMET Lab Results  Component Value Date   NA 138 09/06/2016   K 3.9 09/06/2016   CL 104 09/06/2016   CO2 26 09/06/2016   GLUCOSE 100 (H) 09/06/2016   BUN 9 09/06/2016   CREATININE 0.65 09/06/2016   CALCIUM 8.6 (L) 09/06/2016   GFRNONAA >60 09/06/2016   GFRAA >60 09/06/2016   Assessment and Plan  Enterococcal bacteremia =  she has finished 4 wk of ampicillin for complicated bacteremia. No need for further abtx at this time  rtc if needed

## 2017-01-04 ENCOUNTER — Ambulatory Visit (INDEPENDENT_AMBULATORY_CARE_PROVIDER_SITE_OTHER)
Admission: RE | Admit: 2017-01-04 | Discharge: 2017-01-04 | Disposition: A | Discharge status: Home / Self Care | Payer: BC Managed Care – PPO | Source: Ambulatory Visit | Attending: Primary Care | Admitting: Primary Care

## 2017-01-04 ENCOUNTER — Ambulatory Visit (INDEPENDENT_AMBULATORY_CARE_PROVIDER_SITE_OTHER): Payer: BC Managed Care – PPO | Admitting: Primary Care

## 2017-01-04 ENCOUNTER — Encounter: Payer: Self-pay | Admitting: Primary Care

## 2017-01-04 VITALS — BP 112/72 | HR 83 | Temp 98.5°F | Ht 62.0 in | Wt 222.8 lb

## 2017-01-04 DIAGNOSIS — M79672 Pain in left foot: Secondary | ICD-10-CM

## 2017-01-04 MED ORDER — NAPROXEN 500 MG PO TABS: 500.0000 mg | ORAL_TABLET | Freq: Two times a day (BID) | ORAL | 0 refills | Status: DC

## 2017-01-04 NOTE — Progress Notes (Signed)
Subjective:    Patient ID: Jane Li, female    DOB: 1970/07/04, 46 y.o.   MRN: [id number]  HPI  Ms. Kitchen is a 46 year old female who presents today with a chief complaint of foot pain. Her pain is located to the medial side of her left dorsal foot that she first noticed 2 months ago. 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 Her pain is constantly achy but with intermittent sharp pain with weight bearing movements. She denies swelling, redness, fevers, injury/trauma. She's been taking Ibuprofen 600 mg sporadically with temporary improvement.    Review of Systems  Musculoskeletal: Positive for arthralgias. Negative for joint swelling.  Skin: Negative for color change.  Neurological: Negative for numbness.       Past Medical History:  Diagnosis Date  . Allergy   . Chronic headaches    2/WEEK SINUS AND STRESS HEADACHES  . Deviated nasal septum   . GERD (gastroesophageal reflux disease)   . Hyperlipidemia   . Hypertension    CONTROLLED ON MEDS  . Motion sickness    [place], [place]  . Nasal turbinate hypertrophy    NASAL OBSTRUCTION  . Obesity   . Shortness of breath dyspnea    ON EXERTION  . Sinusitis, chronic      Social History   Social History  . Marital status: Married    Spouse name: N/A  . Number of children: 2  . Years of education: N/A   Occupational History  . 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 Smoking status: Never Smoker  . Smokeless tobacco: Never Used  . Alcohol use 0.0 oz/week     Comment: rare  . Drug use: No  . Sexual activity: Yes    Partners: Male   Other Topics Concern  . Not on file   Social History Narrative   Regular exercise-yes occasionally   Diet: 3 meals daily, varied, no ff, social etoh,    Past Surgical History:  Procedure Laterality Date  . CESAREAN SECTION  2002  . COLONOSCOPY    . FRONTAL SINUS EXPLORATION Bilateral 10/08/2015   Procedure: FRONTAL SINUS EXPLORATION BILATERAL;  Surgeon: Vernie Murders, MD;  Location: [facility];  Service: ENT;  Laterality: Bilateral;  . IMAGE GUIDED SINUS SURGERY N/A 10/08/2015   Procedure: IMAGE GUIDED SINUS SURGERY;  Surgeon: Vernie Murders, MD;  Location: [facility];  Service: ENT;  Laterality: N/A;  GAVE DISK TO [facility] 5/24 DEE UPREG  . MAXILLARY ANTROSTOMY Bilateral 10/08/2015   Procedure: MAXILLARY ANTROSTOMY BILATERAL;  Surgeon: Vernie Murders, MD;  Location: [facility];  Service: ENT;  Laterality: Bilateral;  . SEPTOPLASTY N/A 10/08/2015   Procedure: SEPTOPLASTY;  Surgeon: Vernie Murders, MD;  Location: [facility];  Service: ENT;  Laterality: N/A;  . TEE WITHOUT CARDIOVERSION N/A 09/07/2016   Procedure: TRANSESOPHAGEAL ECHOCARDIOGRAM (TEE);  Surgeon: Wendall Stade, MD;  Location: [facility];  Service: Cardiovascular;  Laterality: N/A;  . TURBINATE REDUCTION Bilateral 10/08/2015   Procedure: TURBINATE REDUCTION BILATERAL;  Surgeon: Vernie Murders, MD;  Location: [facility];  Service: ENT;  Laterality: Bilateral;  . UPPER GASTROINTESTINAL ENDOSCOPY  03-18-2014  . WISDOM TOOTH EXTRACTION      Family History  Problem Relation Age of Onset  . Depression Mother   . Mental illness Mother   . Hypertension Mother   . Arthritis Father   . Thyroid disease Father   .  Mental illness Sister   . Breast cancer Maternal Grandmother   . Diabetes Paternal Grandmother   . Colon cancer Neg Hx   . Stomach cancer Neg Hx     Allergies  Allergen Reactions  . Codeine Nausea And Vomiting    Current Outpatient Prescriptions on File Prior to Visit  Medication Sig Dispense Refill  . calcium carbonate (TUMS - DOSED IN MG ELEMENTAL CALCIUM) 500 MG chewable tablet Chew 1-2 tablets by mouth every 6 (six) hours as needed for indigestion or heartburn.     Marland Kitchen ibuprofen (ADVIL,MOTRIN) 200 MG tablet Take 600 mg by mouth every 6 (six) hours as needed for headache or mild  pain.     Marland Kitchen lisinopril-hydrochlorothiazide (PRINZIDE,ZESTORETIC) 20-12.5 MG tablet Take 1 tablet by mouth daily. 90 tablet 3  . NON FORMULARY On Guard Beadlets: (Proprietary blend of Wild [place], [place], [place], [place], and [place] contained in tiny vegetable beadlets) Take 2-3 beadlets by mouth once daily    . NON FORMULARY Essential Oil(s) Lemon Oil Capsules: Take 1 capsule by mouth one to two times a day    . NON FORMULARY Essential Oil(s) Oregano Capsules: Take 1 capsule by mouth one to two times a day    . NON FORMULARY Essential Oil(s) Grapefruit Capsules: Take 1 capsule by mouth one to two times a day    . nystatin-triamcinolone (MYCOLOG II) cream Apply to the affected areas three times a day as needed for irritation  3  . omeprazole (PRILOSEC) 40 MG capsule TAKE 1 CAPSULE (40 MG TOTAL) BY MOUTH DAILY. 90 capsule 1  . simethicone (GAS-X) 80 MG chewable tablet Chew 80 mg by mouth as needed for flatulence.     . SUMAtriptan (IMITREX) 100 MG tablet 1 tab po as needed for migraine. May repeat in 2 hours if headache persists or recurs. (Patient taking differently: Take 100 mg by mouth once as needed for migraine. And may repeat once in 2 hours if headache persists or recurs) 10 tablet 0  . triamcinolone (NASACORT ALLERGY 24HR) 55 MCG/ACT AERO nasal inhaler Place 2 sprays into the nose every evening.     . venlafaxine XR (EFFEXOR-XR) 75 MG 24 hr capsule Take 75 mg by mouth at bedtime.      No current facility-administered medications on file prior to visit.     BP 112/72   Pulse 83   Temp 98.5 F (36.9 C) (Oral)   Ht  (1.575 m)   Wt 222 lb 12.8 oz (101.1 kg)   SpO2 98%   BMI 40.75 kg/m    Objective:   Physical Exam  Musculoskeletal:       Left foot: There is normal range of motion.       Feet:  Pain to area indicated with plantar and dorsiflexion.   Skin: Skin is warm and dry. No erythema.          Assessment & Plan:  Acute Foot Pain:  Located to medial dorsal  left foot x 2 months. No improvement with sporadic use of ibuprofen. Exam today doesn't suggest gout, infection. Check plain films for potential stress fracture. Could very well be tendonitis.  Rx for Naproxen 500 mg BID x 1-2 weeks provided. Discussed wrapping foot for support, wearing supportive shoes. If no improvement in 1-2 weeks consider sports med eval.  Morrie Sheldon, NP

## 2017-01-04 NOTE — Patient Instructions (Signed)
Start naproxen 500 mg tablets for foot pain. Take 1 tablet by mouth twice daily with food for 1-2 weeks.  Complete xray(s) prior to leaving today. I will notify you of your results once received.  Consider wrapping the foot with a brace or ACE bandage for support. Ice the foot for pain.   Please call us in 1 week if no improvement.  It was a pleasure meeting you!

## 2017-01-05 ENCOUNTER — Other Ambulatory Visit: Payer: Self-pay | Admitting: Family Medicine

## 2017-02-03 ENCOUNTER — Other Ambulatory Visit: Payer: Self-pay | Admitting: Primary Care

## 2017-02-03 DIAGNOSIS — M79672 Pain in left foot: Secondary | ICD-10-CM

## 2017-02-06 NOTE — Telephone Encounter (Signed)
Ok to refill? Electronically refill request for naproxen (NAPROSYN) 500 MG tablet.  This was prescribed by Jae DireKate on an acute visit on 01/04/2017.

## 2017-02-10 ENCOUNTER — Ambulatory Visit: Payer: Self-pay

## 2017-02-10 ENCOUNTER — Telehealth: Payer: Self-pay | Admitting: Internal Medicine

## 2017-02-10 NOTE — Telephone Encounter (Signed)
Pt. called to discuss her symptoms with a nurse. Stated she thinks she has a bladder infection.  Reported she has had sx's of urgency and pressure to urinate x 5 days.  Denied fever/ chills.  Stated she is feeling very tired, dizzy, has some swelling in feet/ ankles, with (L) > (R); also admits to a nonproductive cough.  Reported she has an appt. with Dr. Ermalene SearingBedsole on Tues., and questioned if she should wait until next week for evaluation.  Advised pt. she should be evaluated sooner than Tuesday.  Encouraged her to schedule an E-Visit this evening.  The pt. stated [facility], and will go in and be checked at this time.  No further action taken at this time.     Reason for Disposition . Urinating more frequently than usual (i.e., frequency)  Answer Assessment - Initial Assessment Questions 1. SYMPTOM: "What's the main symptom you're concerned about?" (e.g., frequency, incontinence)     A lot of pressure/ urgency to urinate, but only a little bit comes out   2. ONSET: "When did the  ________  start?"     About 5 days 3. PAIN: "Is there any pain?" If so, ask: "How bad is it?" (Scale: 1-10; mild, moderate, severe)     Denies pain or burning 4. CAUSE: "What do you think is causing the symptoms?"     Possibly a bladder infection 5. OTHER SYMPTOMS: "Do you have any other symptoms?" (e.g., fever, flank pain, blood in urine, pain with urination)     Very tired, very dizzy, some swelling in feet/ankles; left >right; cough, nonproductive. 6. PREGNANCY: "Is there any chance you are pregnant?" "When was your last menstrual period?"     No; uterine ablation  Protocols used: [facility]

## 2017-02-10 NOTE — Telephone Encounter (Signed)
Patient not interested in fu with Dr. Ardyth Manam at this time she states she did not have osa and doesn't need appt

## 2017-02-14 ENCOUNTER — Ambulatory Visit: Payer: BC Managed Care – PPO | Admitting: Family Medicine

## 2017-02-14 ENCOUNTER — Encounter: Payer: Self-pay | Admitting: Family Medicine

## 2017-02-14 ENCOUNTER — Telehealth: Payer: Self-pay | Admitting: *Deleted

## 2017-02-14 NOTE — Telephone Encounter (Signed)
The appointment was cancelled.  CRM discussed with Bhutanena and Rene Kocheregina.  I think it was sent to you to see if you wanted to charge the No Show Fee and/or have the patient to reschedule.  I ask them to document this in the future and just send the CRM to me.

## 2017-02-14 NOTE — Telephone Encounter (Signed)
Copied from CRM #[id number]. Topic: Quick Communication - Appointment Cancellation >> Feb 14, 2017  7:58 AM Guinevere FerrariMorris, Sharamare E, NT wrote: Patient called to cancel appointment scheduled for today @11 :30 with Dr. Ermalene SearingBedsole.    Route to department's PEC pool.

## 2017-02-14 NOTE — Telephone Encounter (Signed)
Was the appt cancelled?   If so.. They don't need to tell me.  If they did not why not?  Please print this and give to [facility].

## 2017-02-17 ENCOUNTER — Encounter: Payer: Self-pay | Admitting: Family Medicine

## 2017-02-17 ENCOUNTER — Ambulatory Visit (INDEPENDENT_AMBULATORY_CARE_PROVIDER_SITE_OTHER): Payer: BC Managed Care – PPO | Admitting: Family Medicine

## 2017-02-17 ENCOUNTER — Other Ambulatory Visit: Payer: Self-pay

## 2017-02-17 VITALS — BP 100/70 | HR 82 | Temp 98.1°F | Ht 62.0 in | Wt 228.8 lb

## 2017-02-17 DIAGNOSIS — R350 Frequency of micturition: Secondary | ICD-10-CM

## 2017-02-17 LAB — POC URINALSYSI DIPSTICK (AUTOMATED)
Bilirubin, UA: NEGATIVE
Glucose, UA: NEGATIVE
Ketones, UA: NEGATIVE
Leukocytes, UA: NEGATIVE
NITRITE UA: NEGATIVE
PH UA: 6.5 (ref 5.0–8.0)
Protein, UA: NEGATIVE
RBC UA: NEGATIVE
Spec Grav, UA: 1.02 (ref 1.010–1.025)
UROBILINOGEN UA: 0.2 U/dL

## 2017-02-17 NOTE — Progress Notes (Signed)
   Subjective:    Patient ID: Jane Li, female    DOB: 03/08/1971, 46 y.o.   MRN: [id number]  HPI    46 year old female presents for urinary frequency , ongoing x 1 week. She has noted that she is not emptying out completely.  Feeling more fatigued.  UA clear on  Urgent care, [facility], 02/10/2017  No dysuria  Lower abd  Pressure constant.  No fever.  No CVA tenderness. Has some low back pain.  no blood in urine.  Hx of enterococcus bacteremia 09/2016   She is worried venlafaxine is making her tired.  Mood is good so would like to wean  Blood pressure 100/70, pulse 82, temperature 98.1 F (36.7 C), temperature source Oral, height 5\' 2"  (1.575 m), weight 228 lb 12 oz (103.8 kg).   Review of Systems  Constitutional: Negative for fatigue and fever.  HENT: Negative for ear pain.   Eyes: Negative for pain.  Respiratory: Negative for chest tightness and shortness of breath.   Cardiovascular: Negative for chest pain, palpitations and leg swelling.  Gastrointestinal: Negative for abdominal pain.  Genitourinary: Negative for dysuria.       Objective:   Physical Exam  Constitutional: Vital signs are normal. She appears well-developed and well-nourished. She is cooperative.  Non-toxic appearance. She does not appear ill. No distress.  HENT:  Head: Normocephalic.  Right Ear: Hearing, tympanic membrane, external ear and ear canal normal. Tympanic membrane is not erythematous, not retracted and not bulging.  Left Ear: Hearing, tympanic membrane, external ear and ear canal normal. Tympanic membrane is not erythematous, not retracted and not bulging.  Nose: No mucosal edema or rhinorrhea. Right sinus exhibits no maxillary sinus tenderness and no frontal sinus tenderness. Left sinus exhibits no maxillary sinus tenderness and no frontal sinus tenderness.  Mouth/Throat: Uvula is midline, oropharynx is clear and moist and mucous membranes are normal.  Eyes: Conjunctivae, EOM and lids are  normal. Pupils are equal, round, and reactive to light. Lids are everted and swept, no foreign bodies found.  Neck: Trachea normal and normal range of motion. Neck supple. Carotid bruit is not present. No thyroid mass and no thyromegaly present.  Cardiovascular: Normal rate, regular rhythm, S1 normal, S2 normal, normal heart sounds, intact distal pulses and normal pulses. Exam reveals no gallop and no friction rub.  No murmur heard. Pulmonary/Chest: Effort normal and breath sounds normal. No tachypnea. No respiratory distress. She has no decreased breath sounds. She has no wheezes. She has no rhonchi. She has no rales.  Abdominal: Soft. Normal appearance and bowel sounds are normal. There is no tenderness.  Neurological: She is alert.  Skin: Skin is warm, dry and intact. No rash noted.  Psychiatric: Her speech is normal and behavior is normal. Judgment and thought content normal. Her mood appears not anxious. Cognition and memory are normal. She does not exhibit a depressed mood.          Assessment & Plan:

## 2017-02-17 NOTE — Patient Instructions (Addendum)
Urinalysis clear today.  Increase water intake to flush bladder.  Avoid bladder irritant like alcohol, caffeine, citris, tomato, pepermint.

## 2017-02-17 NOTE — Assessment & Plan Note (Signed)
No clear sign of infection, stone, diabetes.  Most likely symptoms are from bladder irritation verus urge incontinence.  Treat with water and avoid bladder irritant.. Call if not improving as expected.

## 2017-02-19 ENCOUNTER — Other Ambulatory Visit: Payer: Self-pay | Admitting: Family Medicine

## 2017-02-19 DIAGNOSIS — M79672 Pain in left foot: Secondary | ICD-10-CM

## 2017-02-19 NOTE — Telephone Encounter (Signed)
Last office visit 02/17/17.  Last refilled 02/07/17 for #30 with no refills.  Ok to refill?

## 2017-02-23 ENCOUNTER — Encounter: Payer: Self-pay | Admitting: Family Medicine

## 2017-02-24 ENCOUNTER — Other Ambulatory Visit: Payer: Self-pay | Admitting: Family Medicine

## 2017-02-24 MED ORDER — VENLAFAXINE HCL ER 37.5 MG PO CP24: 37.5000 mg | ORAL_CAPSULE | Freq: Every day | ORAL | 1 refills | Status: DC

## 2017-02-25 ENCOUNTER — Other Ambulatory Visit: Payer: Self-pay | Admitting: Family Medicine

## 2017-03-09 ENCOUNTER — Other Ambulatory Visit: Payer: Self-pay | Admitting: Neurosurgery

## 2017-03-09 DIAGNOSIS — M47816 Spondylosis without myelopathy or radiculopathy, lumbar region: Secondary | ICD-10-CM

## 2017-03-10 ENCOUNTER — Other Ambulatory Visit: Payer: Self-pay | Admitting: Family Medicine

## 2017-03-10 DIAGNOSIS — M79672 Pain in left foot: Secondary | ICD-10-CM

## 2017-03-10 NOTE — Telephone Encounter (Signed)
Last office visit 02/17/2017.  Last refilled 02/20/2017 for #30 with no refills.  Ok to refill?

## 2017-03-21 ENCOUNTER — Other Ambulatory Visit: Payer: BC Managed Care – PPO

## 2017-03-24 ENCOUNTER — Ambulatory Visit (INDEPENDENT_AMBULATORY_CARE_PROVIDER_SITE_OTHER): Payer: BC Managed Care – PPO | Admitting: Family Medicine

## 2017-03-24 ENCOUNTER — Ambulatory Visit
Admission: RE | Admit: 2017-03-24 | Discharge: 2017-03-24 | Disposition: A | Discharge status: Home / Self Care | Payer: BC Managed Care – PPO | Source: Ambulatory Visit | Attending: Neurosurgery | Admitting: Neurosurgery

## 2017-03-24 ENCOUNTER — Other Ambulatory Visit: Payer: BC Managed Care – PPO

## 2017-03-24 ENCOUNTER — Encounter: Payer: Self-pay | Admitting: Family Medicine

## 2017-03-24 VITALS — BP 112/60 | HR 104 | Temp 98.0°F | Wt 228.0 lb

## 2017-03-24 DIAGNOSIS — J029 Acute pharyngitis, unspecified: Secondary | ICD-10-CM | POA: Diagnosis not present

## 2017-03-24 DIAGNOSIS — J069 Acute upper respiratory infection, unspecified: Secondary | ICD-10-CM

## 2017-03-24 DIAGNOSIS — M47816 Spondylosis without myelopathy or radiculopathy, lumbar region: Secondary | ICD-10-CM

## 2017-03-24 LAB — POCT RAPID STREP A (OFFICE): RAPID STREP A SCREEN: NEGATIVE

## 2017-03-24 MED ORDER — GADOBENATE DIMEGLUMINE 529 MG/ML IV SOLN
20.0000 mL | Freq: Once | INTRAVENOUS | Status: AC | PRN
  Administered 2017-03-24: 20 mL via INTRAVENOUS

## 2017-03-24 NOTE — Patient Instructions (Addendum)
Your strep test is negative, you likely have a viral infection  For throat pain- chloraseptic type spray, warm salt water gargles, can take tylenol alternating with your naprosyn  For nasal congestion you can use Afrin nasal spray for 3 days max, Sudafed, saline nasal spray (generic is fine for all). For cough you can try Delsym. Drink enough fluids to make your urine light yellow. Please come back in if you are not better in 5-7 days or if you develop wheezing, shortness of breath or persistent vomiting.    Pharyngitis Pharyngitis is redness, pain, and swelling (inflammation) of your pharynx. What are the causes? Pharyngitis is usually caused by infection. Most of the time, these infections are from viruses (viral) and are part of a cold. However, sometimes pharyngitis is caused by bacteria (bacterial). Pharyngitis can also be caused by allergies. Viral pharyngitis may be spread from person to person by coughing, sneezing, and personal items or utensils (cups, forks, spoons, toothbrushes). Bacterial pharyngitis may be spread from person to person by more intimate contact, such as kissing. What are the signs or symptoms? Symptoms of pharyngitis include:  Sore throat.  Tiredness (fatigue).  Low-grade fever.  Headache.  Joint pain and muscle aches.  Skin rashes.  Swollen lymph nodes.  Plaque-like film on throat or tonsils (often seen with bacterial pharyngitis).  How is this diagnosed? Your health care provider will ask you questions about your illness and your symptoms. Your medical history, along with a physical exam, is often all that is needed to diagnose pharyngitis. Sometimes, a rapid strep test is done. Other lab tests may also be done, depending on the suspected cause. How is this treated? Viral pharyngitis will usually get better in 3-4 days without the use of medicine. Bacterial pharyngitis is treated with medicines that kill germs (antibiotics). Follow these  instructions at home:  Drink enough water and fluids to keep your urine clear or pale yellow.  Only take over-the-counter or prescription medicines as directed by your health care provider: ? If you are prescribed antibiotics, make sure you finish them even if you start to feel better. ? Do not take aspirin.  Get lots of rest.  Gargle with 8 oz of salt water ( tsp of salt per 1 qt of water) as often as every 1-2 hours to soothe your throat.  Throat lozenges (if you are not at risk for choking) or sprays may be used to soothe your throat. Contact a health care provider if:  You have large, tender lumps in your neck.  You have a rash.  You cough up green, yellow-brown, or bloody spit. Get help right away if:  Your neck becomes stiff.  You drool or are unable to swallow liquids.  You vomit or are unable to keep medicines or liquids down.  You have severe pain that does not go away with the use of recommended medicines.  You have trouble breathing (not caused by a stuffy nose). This information is not intended to replace advice given to you by your health care provider. Make sure you discuss any questions you have with your health care provider. Document Released: 03/28/2005 Document Revised: 09/03/2015 Document Reviewed: 12/03/2012 Elsevier Interactive Patient Education  2017 [employer]

## 2017-03-24 NOTE — Progress Notes (Signed)
Subjective:    Patient ID: Jane Li, female    DOB: 08/05/1970, 46 y.o.   MRN: [id number]  HPI This is a 46 yo female who presents today with sore throat on right side x 3 days, has some sinus drainage, mild cough, some lightheadedness last two days- better today. No fever. Taking mucinex D and naproxen with improvement. She is a Comptrollerlibrarian. Has seasonal allergies.     Past Medical History:  Diagnosis Date  . Allergy   . Chronic headaches    2/WEEK SINUS AND STRESS HEADACHES  . Deviated nasal septum   . GERD (gastroesophageal reflux disease)   . Hyperlipidemia   . Hypertension    CONTROLLED ON MEDS  . Motion sickness    [facility], [facility]  . Nasal turbinate hypertrophy    NASAL OBSTRUCTION  . Obesity   . Shortness of breath dyspnea    ON EXERTION  . Sinusitis, chronic    Past Surgical History:  Procedure Laterality Date  . CESAREAN SECTION  2002  . COLONOSCOPY    . FRONTAL SINUS EXPLORATION Bilateral 10/08/2015   Procedure: FRONTAL SINUS EXPLORATION BILATERAL;  Surgeon: Vernie MurdersPaul Juengel, MD;  [facility];  Service: ENT;  Laterality: Bilateral;  . IMAGE GUIDED SINUS SURGERY N/A 10/08/2015   Procedure: IMAGE GUIDED SINUS SURGERY;  Surgeon: Vernie MurdersPaul Juengel, MD;  [facility];  Service: ENT;  Laterality: N/A;  GAVE DISK TO [facility] 5/24 DEE UPREG  . MAXILLARY ANTROSTOMY Bilateral 10/08/2015   Procedure: MAXILLARY ANTROSTOMY BILATERAL;  Surgeon: Vernie MurdersPaul Juengel, MD;  [facility];  Service: ENT;  Laterality: Bilateral;  . SEPTOPLASTY N/A 10/08/2015   Procedure: SEPTOPLASTY;  Surgeon: Vernie MurdersPaul Juengel, MD;  [facility];  Service: ENT;  Laterality: N/A;  . TEE WITHOUT CARDIOVERSION N/A 09/07/2016   Procedure: TRANSESOPHAGEAL ECHOCARDIOGRAM (TEE);  Surgeon: Wendall StadeNishan, Peter C, MD;  [facility];  Service: Cardiovascular;  Laterality: N/A;  . TURBINATE REDUCTION Bilateral 10/08/2015   Procedure: TURBINATE REDUCTION  BILATERAL;  Surgeon: Vernie MurdersPaul Juengel, MD;  [facility];  Service: ENT;  Laterality: Bilateral;  . UPPER GASTROINTESTINAL ENDOSCOPY  03-18-2014  . WISDOM TOOTH EXTRACTION     Family History  Problem Relation Age of Onset  . Depression Mother   . Mental illness Mother   . Hypertension Mother   . Arthritis Father   . Thyroid disease Father   . Mental illness Sister   . Breast cancer Maternal Grandmother   . Diabetes Paternal Grandmother   . Colon cancer Neg Hx   . Stomach cancer Neg Hx    Social History   Tobacco Use  . Smoking status: Never Smoker  . Smokeless tobacco: Never Used  Substance Use Topics  . Alcohol use: Yes    Alcohol/week: 0.0 oz    Comment: rare  . Drug use: No      Review of Systems Per HPI    Objective:   Physical Exam  Constitutional: She is oriented to person, place, and time. She appears well-developed and well-nourished. No distress.  HENT:  Head: Normocephalic and atraumatic.  Right Ear: Tympanic membrane, external ear and ear canal normal.  Left Ear: External ear and ear canal normal. Tympanic membrane is retracted.  Nose: Mucosal edema and rhinorrhea present.  Mouth/Throat: Oropharyngeal exudate (right side) and posterior oropharyngeal erythema present. No posterior oropharyngeal edema.  Eyes: Conjunctivae are normal.  Neck: Normal range of motion. Neck supple.  Cardiovascular: Normal rate, regular rhythm and  normal heart sounds.  Pulmonary/Chest: Effort normal and breath sounds normal.  Neurological: She is alert and oriented to person, place, and time.  Skin: Skin is warm and dry. She is not diaphoretic.  Psychiatric: She has a normal mood and affect. Her behavior is normal. Judgment and thought content normal.  Vitals reviewed.     BP 112/60 (BP Location: Left Arm, Patient Position: Sitting, Cuff Size: Normal)   Pulse (!) 104   Temp 98 F (36.7 C) (Oral)   Wt 228 lb (103.4 kg)   SpO2 98%   BMI 41.70 kg/m  Wt  Readings from Last 3 Encounters:  03/24/17 228 lb (103.4 kg)  02/17/17 228 lb 12 oz (103.8 kg)  01/04/17 222 lb 12.8 oz (101.1 kg)   Results for orders placed or performed in visit on 03/24/17  POCT rapid strep A  Result Value Ref Range   Rapid Strep A Screen Negative Negative       Assessment & Plan:  1. Sore throat - POCT rapid strep A- negative  2. Viral URI - Provided written and verbal information regarding diagnosis and treatment. - RTC precautions reviewed   Olean Reeeborah Gessner, FNP-BC  [facility], [facility]  03/24/2017 5:40 PM

## 2017-03-27 ENCOUNTER — Telehealth: Payer: Self-pay | Admitting: Family Medicine

## 2017-03-27 NOTE — Telephone Encounter (Signed)
Attempted to call patient to discuss symptoms. No answer, will try again later.

## 2017-03-27 NOTE — Telephone Encounter (Signed)
I spoke with pt and she can swallow; no difficulty breathing. Feels like golf ball on rt side of throat; when drinks warm drink makes throat feel better but when drinks cold drink makes throat hurt worse and pain goes into ear; still has ongoing earache; not able to rest at night. Pt was seen 03/24/17. [facility]

## 2017-03-27 NOTE — Telephone Encounter (Signed)
Copied from CRM [phone]#[id number]. Topic: Quick Communication - See Telephone Encounter >> Mar 27, 2017  8:56 AM Elliot GaultBell, Tiffany M wrote: CRM for notification. See Telephone encounter for:   03/27/17.   Relation to UE:AVWUpt:SELF Call back number: [phone] [facility] Pharmacy: [id number] [place], [place] - [address] [phone] (Phone) [phone] (Fax)    Reason for call:  Patient last seen Emi BelfastGessner, Deborah B, FNP 03/24/17 for ear pain and sore throat, patient states symptoms have not improved but actually worsen, patient states she cant swallow cold drinks, requesting Rx, please advise

## 2017-03-27 NOTE — Telephone Encounter (Signed)
Pt called back and got an appt with her ENT for this afternoon

## 2017-04-05 ENCOUNTER — Other Ambulatory Visit: Payer: Self-pay | Admitting: Family Medicine

## 2017-04-05 DIAGNOSIS — M79672 Pain in left foot: Secondary | ICD-10-CM

## 2017-04-06 NOTE — Telephone Encounter (Signed)
Last office visit 03/24/2017 with Harlin Heys. Gessner for URI.  Last refilled 03/10/2017 for #30 with no refills.  Ok to refill?

## 2017-05-01 ENCOUNTER — Other Ambulatory Visit: Payer: Self-pay | Admitting: Family Medicine

## 2017-05-01 DIAGNOSIS — M79672 Pain in left foot: Secondary | ICD-10-CM

## 2017-05-01 NOTE — Telephone Encounter (Signed)
Last office visit 03/24/2017 for URI with D. Leone PayorGessner.  Last refilled 04/07/2017 for #30 with no refills.  Ok to refill?

## 2017-05-18 ENCOUNTER — Other Ambulatory Visit: Payer: Self-pay | Admitting: Family Medicine

## 2017-05-18 DIAGNOSIS — M79672 Pain in left foot: Secondary | ICD-10-CM

## 2017-05-18 NOTE — Telephone Encounter (Signed)
Last office visit 03/24/17 with Harlin Heys. Gessner for URI.  Last refilled 05/01/2017 for #30 with no refills.  Ok to refill?

## 2017-06-01 ENCOUNTER — Other Ambulatory Visit: Payer: Self-pay

## 2017-06-01 ENCOUNTER — Encounter: Payer: Self-pay | Admitting: Family Medicine

## 2017-06-01 ENCOUNTER — Ambulatory Visit: Payer: BC Managed Care – PPO | Admitting: Family Medicine

## 2017-06-01 VITALS — BP 100/64 | HR 131 | Temp 100.1°F | Ht 62.0 in | Wt 225.8 lb

## 2017-06-01 DIAGNOSIS — R000 Tachycardia, unspecified: Secondary | ICD-10-CM | POA: Diagnosis not present

## 2017-06-01 DIAGNOSIS — R509 Fever, unspecified: Secondary | ICD-10-CM | POA: Diagnosis not present

## 2017-06-01 DIAGNOSIS — R52 Pain, unspecified: Secondary | ICD-10-CM | POA: Diagnosis not present

## 2017-06-01 DIAGNOSIS — J101 Influenza due to other identified influenza virus with other respiratory manifestations: Secondary | ICD-10-CM | POA: Diagnosis not present

## 2017-06-01 DIAGNOSIS — J111 Influenza due to unidentified influenza virus with other respiratory manifestations: Secondary | ICD-10-CM | POA: Insufficient documentation

## 2017-06-01 LAB — POC INFLUENZA A&B (BINAX/QUICKVUE)
Influenza A, POC: POSITIVE — AB
Influenza B, POC: NEGATIVE

## 2017-06-01 MED ORDER — IBUPROFEN 200 MG PO TABS
800.0000 mg | ORAL_TABLET | Freq: Once | ORAL | Status: AC
  Administered 2017-06-01: 800 mg via ORAL

## 2017-06-01 MED ORDER — OSELTAMIVIR PHOSPHATE 75 MG PO CAPS: 75.0000 mg | ORAL_CAPSULE | Freq: Two times a day (BID) | ORAL | 0 refills | Status: DC

## 2017-06-01 NOTE — Assessment & Plan Note (Signed)
Likely multifactorial > due to decongestant, mild dehydration, current fever ( treated in office with ibuprofen). EKG unremarkable except for sinus tachy.. No new heart issue and pt without past CV issues.  Push fluids, rest, treat with tamiflu and got to ER if not improving in next 24 hours.

## 2017-06-01 NOTE — Patient Instructions (Addendum)
Rest, push fluids.  Start tamiflu x 5 days.  Stop decongestant.  Mucinex DM twice daily. Call if cough worsens at night for cough suppressant.  Fever control with ibuprofen 800 mg every 8 hours. Last dose given at 4:35 PM in  office  If chest pain, shortness of breath or heart rate not improving with oral intake in 24 hours go to ER.

## 2017-06-01 NOTE — Progress Notes (Signed)
   Subjective:    Patient ID: Jane Li, female    DOB: 11/15/1970, 47 y.o.   MRN: [id number]  Sore Throat   Associated symptoms include congestion. Pertinent negatives include no coughing.  Fever   This is a new problem. The current episode started yesterday. The problem has been rapidly worsening. The maximum temperature noted was 102 to 102.9 F. Associated symptoms include congestion, muscle aches and a sore throat. Pertinent negatives include no coughing. Associated symptoms comments:  Post nasal drip  abdominal cramping.. No emesis or diarrhea.  decreased appetite.  drinking gatorade and water nml UOP. . She has tried NSAIDs for the symptoms. The treatment provided mild relief.  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   She did take decongestant today  Blood pressure 100/64, pulse (!) 131, temperature 100.1 F (37.8 C), temperature source Oral, height 5\' 2"  (1.575 m), weight 225 lb 12 oz (102.4 kg).   Review of Systems  Constitutional: Positive for fever.  HENT: Positive for congestion and sore throat.   Respiratory: Negative for cough.        Objective:   Physical Exam  Constitutional: Vital signs are normal. She appears well-developed and well-nourished. She is cooperative.  Non-toxic appearance. She appears ill. No distress.  HENT:  Head: Normocephalic.  Right Ear: Hearing, tympanic membrane, external ear and ear canal normal. Tympanic membrane is not erythematous, not retracted and not bulging.  Left Ear: Hearing, tympanic membrane, external ear and ear canal normal. Tympanic membrane is not erythematous, not retracted and not bulging.  Nose: No mucosal edema or rhinorrhea. Right sinus exhibits no maxillary sinus tenderness and no frontal sinus tenderness. Left sinus exhibits no maxillary sinus tenderness and no frontal sinus tenderness.  Mouth/Throat: Uvula is midline, oropharynx is clear and moist and mucous  membranes are normal.  Eyes: Conjunctivae, EOM and lids are normal. Pupils are equal, round, and reactive to light. Lids are everted and swept, no foreign bodies found.  Neck: Trachea normal and normal range of motion. Neck supple. Carotid bruit is not present. No thyroid mass and no thyromegaly present.  Cardiovascular: Regular rhythm, S1 normal, S2 normal, normal heart sounds, intact distal pulses and normal pulses. Tachycardia present. Exam reveals no gallop and no friction rub.  No murmur heard. Pulmonary/Chest: Effort normal and breath sounds normal. No tachypnea. No respiratory distress. She has no decreased breath sounds. She has no wheezes. She has no rhonchi. She has no rales.  Abdominal: Soft. Normal appearance and bowel sounds are normal. There is no tenderness.  Neurological: She is alert.  Skin: Skin is warm, dry and intact. No rash noted.  Psychiatric: Her speech is normal and behavior is normal. Judgment and thought content normal. Her mood appears not anxious. Cognition and memory are normal. She does not exhibit a depressed mood.          Assessment & Plan:

## 2017-06-01 NOTE — Assessment & Plan Note (Signed)
Discussed symptomatic care.  Hydration, rest. Call if SOB, cough worsening or prolongued fever. Reviewed flu timeline. She has some health problems that put her at risk for complications , so we decided to use of tamiflu. Reviewed SE of the medication in detail. Pt agreed. Discussed family prophylaxis, sent in for her husband and  her children will call pediatrition to consider tamiflu prophylaxis. She was advised to not return to work until 24 hour after fever resolved on no antipyretics.

## 2017-06-04 ENCOUNTER — Other Ambulatory Visit: Payer: Self-pay | Admitting: Family Medicine

## 2017-06-04 DIAGNOSIS — M79672 Pain in left foot: Secondary | ICD-10-CM

## 2017-06-05 NOTE — Telephone Encounter (Signed)
Last office visit 06/01/2017.  Last refilled 05/18/2017 for #30 with no refills.  Ok to refill?

## 2017-06-08 ENCOUNTER — Ambulatory Visit: Payer: BC Managed Care – PPO | Admitting: Family Medicine

## 2017-06-08 ENCOUNTER — Encounter: Payer: Self-pay | Admitting: Family Medicine

## 2017-06-08 VITALS — BP 100/70 | HR 97 | Temp 98.2°F | Ht 62.0 in | Wt 226.8 lb

## 2017-06-08 DIAGNOSIS — J029 Acute pharyngitis, unspecified: Secondary | ICD-10-CM

## 2017-06-08 MED ORDER — AMOXICILLIN 500 MG PO CAPS: 500.0000 mg | ORAL_CAPSULE | Freq: Three times a day (TID) | ORAL | 0 refills | Status: DC

## 2017-06-08 NOTE — Progress Notes (Signed)
Subjective:    Patient ID: Jane Li, female    DOB: 06/07/1970, 47 y.o.   MRN: [id number]  HPI Her for a ST  Has had ST since the flu  (strep and the flu going around at school)  Thurs/fri got much more severe  She took left over amoxicilin fri/sat/sun and felt better and then symptoms returned when she ran out  Wed and today a lot worse  ST is raw / can swallow but it hurts Feels swollen  Glands felt swollen last week  No rash   No fever  No headache   Lots of drainage (spits out mucous and it is green)  No sinus pain   She was tx for influenza with tamiflu on 06/01/17  Better from that   Patient Active Problem List   Diagnosis Date Noted  . Pharyngitis 06/08/2017  . Influenza A 06/01/2017  . Tachycardia 06/01/2017  . Urinary frequency 02/17/2017  . S/P PICC central line placement 09/16/2016  . Septic thrombophlebitis 09/16/2016  . Acute low back pain 09/16/2016  . Vertigo 09/16/2016  . Anemia 09/16/2016  . Bacteremia due to Enterococcus 09/05/2016  . Right leg pain 09/02/2016  . Ankle pain 08/10/2016  . GAD (generalized anxiety disorder) 02/19/2016  . Decreased libido 12/19/2014  . Helicobacter pylori gastritis 11/12/2013  . High cholesterol 02/01/2013  . HTN (hypertension) 04/13/2012  . GERD 07/30/2009  . BMI 37.0-37.9, adult 05/11/2007  . Migraine headache without aura 05/03/2007  . Allergic rhinitis 05/03/2007  . FATIGUE, CHRONIC 08/15/2006   Past Medical History:  Diagnosis Date  . Allergy   . Chronic headaches    2/WEEK SINUS AND STRESS HEADACHES  . Deviated nasal septum   . GERD (gastroesophageal reflux disease)   . Hyperlipidemia   . Hypertension    CONTROLLED ON MEDS  . Motion sickness    [place], [place]  . Nasal turbinate hypertrophy    NASAL OBSTRUCTION  . Obesity   . Shortness of breath dyspnea    ON EXERTION  . Sinusitis, chronic    Past Surgical History:  Procedure Laterality Date  . CESAREAN SECTION  2002  .  COLONOSCOPY    . FRONTAL SINUS EXPLORATION Bilateral 10/08/2015   Procedure: FRONTAL SINUS EXPLORATION BILATERAL;  Surgeon: Vernie MurdersPaul Juengel, MD;  [facility];  Service: ENT;  Laterality: Bilateral;  . IMAGE GUIDED SINUS SURGERY N/A 10/08/2015   Procedure: IMAGE GUIDED SINUS SURGERY;  Surgeon: Vernie MurdersPaul Juengel, MD;  [facility];  Service: ENT;  Laterality: N/A;  GAVE DISK TO [facility] 5/24 DEE UPREG  . MAXILLARY ANTROSTOMY Bilateral 10/08/2015   Procedure: MAXILLARY ANTROSTOMY BILATERAL;  Surgeon: Vernie MurdersPaul Juengel, MD;  [facility];  Service: ENT;  Laterality: Bilateral;  . SEPTOPLASTY N/A 10/08/2015   Procedure: SEPTOPLASTY;  Surgeon: Vernie MurdersPaul Juengel, MD;  [facility];  Service: ENT;  Laterality: N/A;  . TEE WITHOUT CARDIOVERSION N/A 09/07/2016   Procedure: TRANSESOPHAGEAL ECHOCARDIOGRAM (TEE);  Surgeon: Wendall StadeNishan, Peter C, MD;  [facility];  Service: Cardiovascular;  Laterality: N/A;  . TURBINATE REDUCTION Bilateral 10/08/2015   Procedure: TURBINATE REDUCTION BILATERAL;  Surgeon: Vernie MurdersPaul Juengel, MD;  [facility];  Service: ENT;  Laterality: Bilateral;  . UPPER GASTROINTESTINAL ENDOSCOPY  03-18-2014  . WISDOM TOOTH EXTRACTION     Social History   Tobacco Use  . Smoking status: Never Smoker  . Smokeless tobacco: Never Used  Substance Use Topics  . Alcohol use: Yes  Alcohol/week: 0.0 oz    Comment: rare  . Drug use: No   Family History  Problem Relation Age of Onset  . Depression Mother   . Mental illness Mother   . Hypertension Mother   . Arthritis Father   . Thyroid disease Father   . Mental illness Sister   . Breast cancer Maternal Grandmother   . Diabetes Paternal Grandmother   . Colon cancer Neg Hx   . Stomach cancer Neg Hx    Allergies  Allergen Reactions  . Codeine Nausea And Vomiting   Current Outpatient Medications on File Prior to Visit  Medication Sig Dispense Refill  . calcium carbonate  (TUMS - DOSED IN MG ELEMENTAL CALCIUM) 500 MG chewable tablet Chew 1-2 tablets by mouth every 6 (six) hours as needed for indigestion or heartburn.     Marland Kitchen ibuprofen (ADVIL,MOTRIN) 200 MG tablet Take 600 mg by mouth every 6 (six) hours as needed for headache or mild pain.     Marland Kitchen lisinopril-hydrochlorothiazide (PRINZIDE,ZESTORETIC) 20-12.5 MG tablet Take 1 tablet by mouth daily. 90 tablet 3  . naproxen (NAPROSYN) 500 MG tablet TAKE 1 TABLET (500 MG TOTAL) BY MOUTH 2 (TWO) TIMES DAILY WITH A MEAL 60 tablet 0  . NON FORMULARY On Guard Beadlets: (Proprietary blend of [place], [place], [place], [place], and [place] contained in tiny vegetable beadlets) Take 2-3 beadlets by mouth once daily    . NON FORMULARY Essential Oil(s) Lemon Oil Capsules: Take 1 capsule by mouth one to two times a day    . NON FORMULARY Essential Oil(s) Oregano Capsules: Take 1 capsule by mouth one to two times a day    . NON FORMULARY Essential Oil(s) Grapefruit Capsules: Take 1 capsule by mouth one to two times a day    . nystatin-triamcinolone (MYCOLOG II) cream Apply to the affected areas three times a day as needed for irritation  3  . omeprazole (PRILOSEC) 40 MG capsule TAKE 1 CAPSULE (40 MG TOTAL) BY MOUTH DAILY. 90 capsule 1  . simethicone (GAS-X) 80 MG chewable tablet Chew 80 mg by mouth as needed for flatulence.     . triamcinolone (NASACORT ALLERGY 24HR) 55 MCG/ACT AERO nasal inhaler Place 2 sprays into the nose every evening.      No current facility-administered medications on file prior to visit.      Review of Systems  Constitutional: Positive for appetite change and fatigue. Negative for fever.  HENT: Positive for congestion, postnasal drip, rhinorrhea, sinus pressure, sneezing and sore throat. Negative for ear pain and trouble swallowing.   Eyes: Negative for pain and discharge.  Respiratory: Positive for cough. Negative for shortness of breath, wheezing and stridor.   Cardiovascular: Negative for chest  pain.  Gastrointestinal: Negative for anal bleeding, diarrhea, nausea and vomiting.  Genitourinary: Negative for frequency, hematuria and urgency.  Musculoskeletal: Negative for arthralgias and myalgias.  Skin: Negative for rash.  Neurological: Positive for headaches. Negative for dizziness, weakness and light-headedness.  Psychiatric/Behavioral: Negative for confusion and dysphoric mood.       Objective:   Physical Exam  Constitutional: She appears well-developed and well-nourished. No distress.  obese and well appearing   HENT:  Head: Normocephalic and atraumatic.  Right Ear: External ear normal.  Left Ear: External ear normal.  Nose: Nose normal.  Mildly erythematous throat with tonsillar swelling  No exudate or lesions   Eyes: Conjunctivae and EOM are normal. Pupils are equal, round, and reactive to light. Right eye exhibits no discharge. Left  eye exhibits no discharge.  Neck: Normal range of motion. Neck supple.  Cardiovascular: Regular rhythm and normal heart sounds.  Pulmonary/Chest: Effort normal and breath sounds normal. No respiratory distress. She has no wheezes. She has no rales.  Musculoskeletal: She exhibits no edema.  Lymphadenopathy:    She has no cervical adenopathy.  Neurological: She is alert. She has normal reflexes. No cranial nerve deficit.  Skin: Skin is warm and dry. No rash noted. No erythema. No pallor.  Psychiatric: She has a normal mood and affect.          Assessment & Plan:   Problem List Items Addressed This Visit      Respiratory   Pharyngitis    Partially tx at home with amoxicillin-so swab/cx may not be accurate  Cover with amoxicillin  Supportive care  Disc symptomatic care - see instructions on AVS  Update if not starting to improve in a week or if worsening   Avs:    Tylenol or ibuprofen for throat pain  Take the amoxicillin as directed  Drink lots of fluids Rest    Update if not starting to improve in a week or if worsening

## 2017-06-08 NOTE — Patient Instructions (Signed)
Tylenol or ibuprofen for throat pain  Take the amoxicillin as directed  Drink lots of fluids Rest    Update if not starting to improve in a week or if worsening

## 2017-06-08 NOTE — Assessment & Plan Note (Signed)
Partially tx at home with amoxicillin-so swab/cx may not be accurate  Cover with amoxicillin  Supportive care  Disc symptomatic care - see instructions on AVS  Update if not starting to improve in a week or if worsening   Avs:    Tylenol or ibuprofen for throat pain  Take the amoxicillin as directed  Drink lots of fluids Rest    Update if not starting to improve in a week or if worsening

## 2017-08-17 LAB — HM PAP SMEAR

## 2017-08-24 ENCOUNTER — Other Ambulatory Visit: Payer: Self-pay | Admitting: Family Medicine

## 2017-09-24 ENCOUNTER — Other Ambulatory Visit: Payer: Self-pay | Admitting: Family Medicine

## 2017-10-03 ENCOUNTER — Telehealth: Payer: Self-pay | Admitting: Family Medicine

## 2017-10-03 ENCOUNTER — Other Ambulatory Visit: Payer: BC Managed Care – PPO

## 2017-10-03 DIAGNOSIS — E7800 Pure hypercholesterolemia, unspecified: Secondary | ICD-10-CM

## 2017-10-03 NOTE — Telephone Encounter (Signed)
-----   Message from Wendi MayaLauren Greeson, RT sent at 09/26/2017  3:00 PM EDT ----- Regarding: Lab orders for Tuesday 10/03/17 Please enter CPE lab orders for 10/03/17. Thanks-Lauren

## 2017-10-06 ENCOUNTER — Encounter: Payer: BC Managed Care – PPO | Admitting: Family Medicine

## 2017-10-19 ENCOUNTER — Encounter: Payer: Self-pay | Admitting: Family Medicine

## 2017-10-19 ENCOUNTER — Ambulatory Visit: Payer: BC Managed Care – PPO | Admitting: Family Medicine

## 2017-10-19 VITALS — BP 100/72 | HR 91 | Temp 98.6°F | Ht 65.0 in | Wt 223.5 lb

## 2017-10-19 DIAGNOSIS — R232 Flushing: Secondary | ICD-10-CM | POA: Insufficient documentation

## 2017-10-19 DIAGNOSIS — M25522 Pain in left elbow: Secondary | ICD-10-CM | POA: Diagnosis not present

## 2017-10-19 DIAGNOSIS — F4323 Adjustment disorder with mixed anxiety and depressed mood: Secondary | ICD-10-CM | POA: Diagnosis not present

## 2017-10-19 DIAGNOSIS — R5383 Other fatigue: Secondary | ICD-10-CM

## 2017-10-19 MED ORDER — DICLOFENAC SODIUM 75 MG PO TBEC: 75.0000 mg | DELAYED_RELEASE_TABLET | Freq: Two times a day (BID) | ORAL | 0 refills | Status: DC

## 2017-10-19 NOTE — Assessment & Plan Note (Signed)
Eval with labs.. Acute on chronic worsening. May be due to mood.

## 2017-10-19 NOTE — Progress Notes (Signed)
Subjective:    Patient ID: Jane Li, female    DOB: 06/07/1970, 47 y.o.   MRN: [id number]  HPI   47 year old female presents with new onset elbow pain in last several month.  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 Pain has not gotten better with summer.  Left lateral elbow.. Radiates down forearm.  No swelling or redness. Pain worse with lifting arm up with a weight. Aleve 500 mg  Once daily has helped minimally temporarily.  no fall, no injury.  She has also noted an increase in anxiety in the last 3 weeks. She has had some stressors in hese times. She is easily tearful,  Moody. Does not feel depressed, just overly emotional.  Feels tired but sleeping well at night.  She is moving and thinks that is making her feel anxious.. Has some panic attacks.. chest tight and panic attack... Deep breaths and relaxing helps this go away.  no exertional chest pain. GAD10  PHQ9 7   Father has thyroid issues, mother is bipolar.  no new constipation, no new swelling in ankle, no dry skin, feeling hot than everyone else.  She had ablation so  Does not have periods.  Blood pressure 100/72, pulse 91, temperature 98.6 F (37 C), temperature source Oral, height 5\' 5"  (1.651 m), weight 223 lb 8 oz (101.4 kg). Marland Kitchen.abehx  Review of Systems  Constitutional: Positive for fatigue. Negative for fever.  HENT: Negative for congestion.   Eyes: Negative for pain.  Respiratory: Negative for cough and shortness of breath.   Cardiovascular: Negative for chest pain, palpitations and leg swelling.  Gastrointestinal: Negative for abdominal pain.  Genitourinary: Negative for dysuria and vaginal bleeding.  Musculoskeletal: Negative for back pain.  Neurological: Negative for syncope, light-headedness and headaches.  Psychiatric/Behavioral: Negative for dysphoric mood. The patient is nervous/anxious.        Objective:   Physical Exam  Constitutional: Vital signs are normal. She appears well-developed  and well-nourished. She is cooperative.  Non-toxic appearance. She does not appear ill. No distress.  HENT:  Head: Normocephalic.  Right Ear: Hearing, tympanic membrane, external ear and ear canal normal. Tympanic membrane is not erythematous, not retracted and not bulging.  Left Ear: Hearing, tympanic membrane, external ear and ear canal normal. Tympanic membrane is not erythematous, not retracted and not bulging.  Nose: No mucosal edema or rhinorrhea. Right sinus exhibits no maxillary sinus tenderness and no frontal sinus tenderness. Left sinus exhibits no maxillary sinus tenderness and no frontal sinus tenderness.  Mouth/Throat: Uvula is midline, oropharynx is clear and moist and mucous membranes are normal.  Eyes: Pupils are equal, round, and reactive to light. Conjunctivae, EOM and lids are normal. Lids are everted and swept, no foreign bodies found.  Neck: Trachea normal and normal range of motion. Neck supple. Carotid bruit is not present. No thyroid mass and no thyromegaly present.  Cardiovascular: Normal rate, regular rhythm, S1 normal, S2 normal, normal heart sounds, intact distal pulses and normal pulses. Exam reveals no gallop and no friction rub.  No murmur heard. Pulmonary/Chest: Effort normal and breath sounds normal. No tachypnea. No respiratory distress. She has no decreased breath sounds. She has no wheezes. She has no rhonchi. She has no rales.  Abdominal: Soft. Normal appearance and bowel sounds are normal. There is no tenderness.  Musculoskeletal:       Left elbow: She exhibits normal range of motion and no swelling. Tenderness found. Lateral epicondyle tenderness noted. No radial head,  no medial epicondyle and no olecranon process tenderness noted.  Neurological: She is alert.  Skin: Skin is warm, dry and intact. No rash noted.  Psychiatric: Her speech is normal and behavior is normal. Judgment and thought content normal. Her mood appears not anxious. Cognition and memory are  normal. She does not exhibit a depressed mood.          Assessment & Plan:

## 2017-10-19 NOTE — Assessment & Plan Note (Signed)
She will return to counseling.. If persistent symptoms.. She will return for med to treat.

## 2017-10-19 NOTE — Patient Instructions (Addendum)
Please stop at the lab to have labs drawn. Set up counselor as planned.  Start home PT.  Start diclofenac twice daily for pain and inflammation. Ice  Elbow.  Tennis Elbow Tennis elbow (lateral epicondylitis) is inflammation of the outer tendons of your forearm close to your elbow. Your tendons attach your muscles to your bones. The outer tendons of your forearm are used to extend your wrist, and they attach on the outside part of your elbow. Tennis elbow is often found in people who play tennis, but anyone may get the condition from repeatedly extending the wrist or turning the forearm. What are the causes? This condition is caused by repeatedly extending your wrist and using your hands. It can result from sports or work that requires repetitive forearm movements. Tennis elbow may also be caused by an injury. What increases the risk? You have a higher risk of developing tennis elbow if you play tennis or another racquet sport. You also have a higher risk if you frequently use your hands for work. This condition is also more likely to develop in:  Musicians.  Carpenters, painters, and plumbers.  Cooks.  Cashiers.  People who work in [employer].  Holiday representative workers.  Butchers.  People who use computers.  What are the signs or symptoms? Symptoms of this condition include:  Pain and tenderness in your forearm and the outer part of your elbow. You may only feel the pain when you use your arm, or you may feel it even when you are not using your arm.  A burning feeling that runs from your elbow through your arm.  Weak grip in your hands.  How is this diagnosed? This condition may be diagnosed by medical history and physical exam. You may also have other tests, including:  X-rays.  MRI.  How is this treated? Your health care provider will recommend lifestyle adjustments, such as resting and icing your arm. Treatment may also include:  Medicines for inflammation. This may  include shots of cortisone if your pain continues.  Physical therapy. This may include massage or exercises.  An elbow brace.  Surgery may eventually be recommended if your pain does not go away with treatment. Follow these instructions at home: Activity  Rest your elbow and wrist as directed by your health care provider. Try to avoid any activities that caused the problem until your health care provider says that you can do them again.  If a physical therapist teaches you exercises, do all of them as directed.  If you lift an object, lift it with your palm facing upward. This lowers the stress on your elbow. Lifestyle  If your tennis elbow is caused by sports, check your equipment and make sure that: ? You are using it correctly. ? It is the best fit for you.  If your tennis elbow is caused by work, take breaks frequently, if you are able. Talk with your manager about how to best perform tasks in a way that is safe. ? If your tennis elbow is caused by computer use, talk with your manager about any changes that can be made to your work environment. General instructions  If directed, apply ice to the painful area: ? Put ice in a plastic bag. ? Place a towel between your skin and the bag. ? Leave the ice on for 20 minutes, 2-3 times per day.  Take medicines only as directed by your health care provider.  If you were given a brace, wear it as directed  by your health care provider.  Keep all follow-up visits as directed by your health care provider. This is important. Contact a health care provider if:  Your pain does not get better with treatment.  Your pain gets worse.  You have numbness or weakness in your forearm, hand, or fingers. This information is not intended to replace advice given to you by your health care provider. Make sure you discuss any questions you have with your health care provider. Document Released: 03/28/2005 Document Revised: 11/26/2015 Document  Reviewed: 03/24/2014 Elsevier Interactive Patient Education  [employer]2018 Elsevier Inc.

## 2017-10-19 NOTE — Assessment & Plan Note (Signed)
Likely lateral epicondylitis.. Treat with NSAIDs, ICE and home PT.

## 2017-10-19 NOTE — Assessment & Plan Note (Signed)
May be perimenopause.. eval with labs cannot tell with s/p ablation.

## 2017-10-20 LAB — CBC WITH DIFFERENTIAL/PLATELET
BASOS ABS: 0.1 10*3/uL (ref 0.0–0.1)
Basophils Relative: 0.9 % (ref 0.0–3.0)
Eosinophils Absolute: 0.1 10*3/uL (ref 0.0–0.7)
Eosinophils Relative: 0.7 % (ref 0.0–5.0)
HCT: 42.1 % (ref 36.0–46.0)
Hemoglobin: 14.1 g/dL (ref 12.0–15.0)
Lymphocytes Relative: 29.5 % (ref 12.0–46.0)
Lymphs Abs: 2.8 10*3/uL (ref 0.7–4.0)
MCHC: 33.6 g/dL (ref 30.0–36.0)
MCV: 93.4 fl (ref 78.0–100.0)
MONOS PCT: 7.4 % (ref 3.0–12.0)
Monocytes Absolute: 0.7 10*3/uL (ref 0.1–1.0)
NEUTROS PCT: 61.5 % (ref 43.0–77.0)
Neutro Abs: 5.9 10*3/uL (ref 1.4–7.7)
Platelets: 287 10*3/uL (ref 150.0–400.0)
RBC: 4.5 Mil/uL (ref 3.87–5.11)
RDW: 14.2 % (ref 11.5–15.5)
WBC: 9.6 10*3/uL (ref 4.0–10.5)

## 2017-10-20 LAB — T4, FREE: FREE T4: 0.77 ng/dL (ref 0.60–1.60)

## 2017-10-20 LAB — COMPREHENSIVE METABOLIC PANEL
ALBUMIN: 4.3 g/dL (ref 3.5–5.2)
ALT: 18 U/L (ref 0–35)
AST: 13 U/L (ref 0–37)
Alkaline Phosphatase: 75 U/L (ref 39–117)
BILIRUBIN TOTAL: 0.3 mg/dL (ref 0.2–1.2)
BUN: 20 mg/dL (ref 6–23)
CO2: 28 mEq/L (ref 19–32)
CREATININE: 0.96 mg/dL (ref 0.40–1.20)
Calcium: 9.9 mg/dL (ref 8.4–10.5)
Chloride: 104 mEq/L (ref 96–112)
GFR: 65.54 mL/min (ref >60.00)
Glucose, Bld: 98 mg/dL (ref 70–99)
Potassium: 4.4 mEq/L (ref 3.5–5.1)
SODIUM: 142 meq/L (ref 135–145)
TOTAL PROTEIN: 7.1 g/dL (ref 6.0–8.3)

## 2017-10-20 LAB — ESTRADIOL: Estradiol: 88 pg/mL

## 2017-10-20 LAB — FOLLICLE STIMULATING HORMONE: FSH: 3 m[IU]/mL

## 2017-10-20 LAB — VITAMIN D 25 HYDROXY (VIT D DEFICIENCY, FRACTURES): VITD: 11.6 ng/mL — ABNORMAL LOW (ref 30.00–100.00)

## 2017-10-20 LAB — TSH: TSH: 0.76 u[IU]/mL (ref 0.35–4.50)

## 2017-10-20 LAB — VITAMIN B12: VITAMIN B 12: 259 pg/mL (ref 211–911)

## 2017-10-20 LAB — LUTEINIZING HORMONE: LH: 0.51 m[IU]/mL

## 2017-10-20 LAB — T3, FREE: T3 FREE: 3.1 pg/mL (ref 2.3–4.2)

## 2017-10-20 MED ORDER — VITAMIN D3 1.25 MG (50000 UT) PO TABS: 1.0000 | ORAL_TABLET | ORAL | 0 refills | Status: DC

## 2017-10-20 NOTE — Addendum Note (Signed)
Addended by: Damita LackLORING, Alaisha Eversley S on: 10/20/2017 02:22 PM   Modules accepted: Orders

## 2017-11-19 ENCOUNTER — Other Ambulatory Visit: Payer: Self-pay | Admitting: Family Medicine

## 2017-12-05 ENCOUNTER — Encounter: Payer: BC Managed Care – PPO | Admitting: Family Medicine

## 2018-01-08 ENCOUNTER — Other Ambulatory Visit: Payer: Self-pay | Admitting: Family Medicine

## 2018-01-17 ENCOUNTER — Other Ambulatory Visit: Payer: Self-pay | Admitting: Family Medicine

## 2018-01-17 NOTE — Telephone Encounter (Signed)
Last office visit 10/19/17 for fatigue.  Last Vit D 10/19/2017 low at 11.60 ng/ml.  Last refilled 10/20/2017 for #12 with no refills.  Refill?

## 2018-04-08 ENCOUNTER — Other Ambulatory Visit: Payer: Self-pay | Admitting: Family Medicine

## 2018-04-13 ENCOUNTER — Encounter: Payer: Self-pay | Admitting: Family Medicine

## 2018-04-13 ENCOUNTER — Ambulatory Visit: Payer: BC Managed Care – PPO | Admitting: Family Medicine

## 2018-04-13 VITALS — BP 100/70 | HR 80 | Temp 98.4°F | Ht 65.0 in | Wt 226.2 lb

## 2018-04-13 DIAGNOSIS — K219 Gastro-esophageal reflux disease without esophagitis: Secondary | ICD-10-CM

## 2018-04-13 DIAGNOSIS — R1013 Epigastric pain: Secondary | ICD-10-CM | POA: Diagnosis not present

## 2018-04-13 LAB — CBC WITH DIFFERENTIAL/PLATELET
BASOS ABS: 0.1 10*3/uL (ref 0.0–0.1)
Basophils Relative: 0.7 % (ref 0.0–3.0)
EOS ABS: 0.1 10*3/uL (ref 0.0–0.7)
Eosinophils Relative: 1.4 % (ref 0.0–5.0)
HEMATOCRIT: 43.8 % (ref 36.0–46.0)
Hemoglobin: 14.7 g/dL (ref 12.0–15.0)
LYMPHS ABS: 2.8 10*3/uL (ref 0.7–4.0)
LYMPHS PCT: 37.8 % (ref 12.0–46.0)
MCHC: 33.4 g/dL (ref 30.0–36.0)
MCV: 92.4 fl (ref 78.0–100.0)
MONO ABS: 0.5 10*3/uL (ref 0.1–1.0)
Monocytes Relative: 6.8 % (ref 3.0–12.0)
NEUTROS ABS: 4 10*3/uL (ref 1.4–7.7)
NEUTROS PCT: 53.3 % (ref 43.0–77.0)
PLATELETS: 268 10*3/uL (ref 150.0–400.0)
RBC: 4.74 Mil/uL (ref 3.87–5.11)
RDW: 13.8 % (ref 11.5–15.5)
WBC: 7.4 10*3/uL (ref 4.0–10.5)

## 2018-04-13 LAB — COMPREHENSIVE METABOLIC PANEL
ALT: 15 U/L (ref 0–35)
AST: 13 U/L (ref 0–37)
Albumin: 4.2 g/dL (ref 3.5–5.2)
Alkaline Phosphatase: 68 U/L (ref 39–117)
BILIRUBIN TOTAL: 0.4 mg/dL (ref 0.2–1.2)
BUN: 17 mg/dL (ref 6–23)
CHLORIDE: 101 meq/L (ref 96–112)
CO2: 28 meq/L (ref 19–32)
CREATININE: 0.92 mg/dL (ref 0.40–1.20)
Calcium: 9.4 mg/dL (ref 8.4–10.5)
GFR: 68.7 mL/min (ref >60.00)
GLUCOSE: 93 mg/dL (ref 70–99)
Potassium: 4.4 mEq/L (ref 3.5–5.1)
SODIUM: 136 meq/L (ref 135–145)
Total Protein: 7 g/dL (ref 6.0–8.3)

## 2018-04-13 LAB — LIPASE: Lipase: 13 U/L (ref 11.0–59.0)

## 2018-04-13 MED ORDER — PANTOPRAZOLE SODIUM 40 MG PO TBEC: 40.0000 mg | DELAYED_RELEASE_TABLET | Freq: Every day | ORAL | 3 refills | Status: DC

## 2018-04-13 MED ORDER — SUCRALFATE 1 GM/10ML PO SUSP: 1.0000 g | Freq: Three times a day (TID) | ORAL | 0 refills | Status: DC

## 2018-04-13 NOTE — Patient Instructions (Signed)
Avoid acidic foods, spicy, tomato, alcohol foods ie. triggers.  No aleve, ibuprofen, naprosyn. Instead use tylenol for pain.  Stop prilosec, change to protonix.  Can use Carafate as needed as well.  Call if not improivng in 2 weeks.

## 2018-04-13 NOTE — Addendum Note (Signed)
Addended by: Kerby Nora E on: 04/13/2018 09:42 AM   Modules accepted: Orders

## 2018-04-13 NOTE — Assessment & Plan Note (Signed)
Poor control. Stop triggers and change to protonix.

## 2018-04-13 NOTE — Assessment & Plan Note (Addendum)
Eval with labs for cbc, CMET and lipase.   Possible PUD vs gastritis.Marland Kitchen possibly related to recent increase in NSAIDS.  Start carafate, change PPI and avoid triggers.  If not improving in 2 weeks consider Hpylori testing and or referral to GI.

## 2018-04-13 NOTE — Progress Notes (Signed)
Subjective:    Patient ID: Jane Li, female    DOB: Aug 31, 1970, 48 y.o.   MRN: [id number]  HPI  48 year old female pt presents for GERD   She reports constant upper abdominal burning in last month. After eating she has a lot of bloating, occ passing gas. When she eats she feels that when she eats the food gets caught in throat, irritated throat. Acid foam will come up into mouth when bending over.  Frequent hiccuping.  She has been using a lot of gas-x, omeprazole 40 mg daily. She has been trying teat smaller.  Using tums helps a lottile. Wt Readings from Last 3 Encounters:  04/13/18 226 lb 4 oz (102.6 kg)  10/19/17 223 lb 8 oz (101.4 kg)  06/08/17 226 lb 12 oz (102.9 kg)    In last few month she has been using more aleve, naprosyn for back pain.  Going to chiropractor now.  2015 upper endoscopy: gastritis, mild Past positive hpylori  Dr. Christella Hartigan Social History /Family History/Past Medical History reviewed in detail and updated in EMR if needed. Blood pressure 100/70, pulse 80, temperature 98.4 F (36.9 C), temperature source Oral, height 5\' 5"  (1.651 m), weight 226 lb 4 oz (102.6 kg).   Review of Systems  Constitutional: Negative for fatigue and fever.  HENT: Negative for congestion.   Eyes: Negative for pain.  Respiratory: Negative for cough and shortness of breath.   Cardiovascular: Negative for chest pain, palpitations and leg swelling.  Gastrointestinal: Negative for abdominal pain.  Genitourinary: Negative for dysuria and vaginal bleeding.  Musculoskeletal: Negative for back pain.  Neurological: Negative for syncope, light-headedness and headaches.  Psychiatric/Behavioral: Negative for dysphoric mood.       Objective:   Physical Exam Constitutional:      General: She is not in acute distress.    Appearance: Normal appearance. She is well-developed. She is not ill-appearing or toxic-appearing.  HENT:     Head: Normocephalic.     Right Ear: Hearing,  tympanic membrane, ear canal and external ear normal. Tympanic membrane is not erythematous, retracted or bulging.     Left Ear: Hearing, tympanic membrane, ear canal and external ear normal. Tympanic membrane is not erythematous, retracted or bulging.     Nose: No mucosal edema or rhinorrhea.     Right Sinus: No maxillary sinus tenderness or frontal sinus tenderness.     Left Sinus: No maxillary sinus tenderness or frontal sinus tenderness.     Mouth/Throat:     Pharynx: Uvula midline.  Eyes:     General: Lids are normal. Lids are everted, no foreign bodies appreciated.     Conjunctiva/sclera: Conjunctivae normal.     Pupils: Pupils are equal, round, and reactive to light.  Neck:     Musculoskeletal: Normal range of motion and neck supple.     Thyroid: No thyroid mass or thyromegaly.     Vascular: No carotid bruit.     Trachea: Trachea normal.  Cardiovascular:     Rate and Rhythm: Normal rate and regular rhythm.     Pulses: Normal pulses.     Heart sounds: Normal heart sounds, S1 normal and S2 normal. No murmur. No friction rub. No gallop.   Pulmonary:     Effort: Pulmonary effort is normal. No tachypnea or respiratory distress.     Breath sounds: Normal breath sounds. No decreased breath sounds, wheezing, rhonchi or rales.  Abdominal:     General: Bowel sounds are normal.  Palpations: Abdomen is soft.     Tenderness: There is abdominal tenderness in the epigastric area.  Skin:    General: Skin is warm and dry.     Findings: No rash.  Neurological:     Mental Status: She is alert.  Psychiatric:        Mood and Affect: Mood is not anxious or depressed.        Speech: Speech normal.        Behavior: Behavior normal. Behavior is cooperative.        Thought Content: Thought content normal.        Judgment: Judgment normal.           Assessment & Plan:

## 2018-04-17 ENCOUNTER — Encounter: Payer: Self-pay | Admitting: Family Medicine

## 2018-04-19 ENCOUNTER — Ambulatory Visit: Payer: BC Managed Care – PPO | Admitting: Family Medicine

## 2018-04-22 ENCOUNTER — Other Ambulatory Visit: Payer: Self-pay | Admitting: Family Medicine

## 2018-04-23 NOTE — Telephone Encounter (Signed)
See Note from Pharmacy:  Sucralfate in unavailable.  Can do PA for Brand Name or change to different medication.  Please advise.

## 2018-04-23 NOTE — Telephone Encounter (Signed)
Let pt know she does not have to have this med. Continue other med and continue as planned. Will refuse prescription/PA.

## 2018-04-24 ENCOUNTER — Other Ambulatory Visit: Payer: Self-pay | Admitting: Family Medicine

## 2018-04-24 DIAGNOSIS — R1013 Epigastric pain: Secondary | ICD-10-CM

## 2018-04-24 NOTE — Telephone Encounter (Signed)
See MyChart patient sent today.

## 2018-07-09 ENCOUNTER — Other Ambulatory Visit: Payer: Self-pay | Admitting: Family Medicine

## 2018-08-08 ENCOUNTER — Encounter: Payer: Self-pay | Admitting: Gastroenterology

## 2018-08-08 ENCOUNTER — Ambulatory Visit (INDEPENDENT_AMBULATORY_CARE_PROVIDER_SITE_OTHER): Payer: BC Managed Care – PPO | Admitting: Gastroenterology

## 2018-08-08 ENCOUNTER — Other Ambulatory Visit: Payer: Self-pay

## 2018-08-08 VITALS — BP 100/70 | HR 70 | Ht 64.0 in | Wt 225.0 lb

## 2018-08-08 DIAGNOSIS — G8929 Other chronic pain: Secondary | ICD-10-CM

## 2018-08-08 DIAGNOSIS — R1013 Epigastric pain: Secondary | ICD-10-CM | POA: Diagnosis not present

## 2018-08-08 DIAGNOSIS — K219 Gastro-esophageal reflux disease without esophagitis: Secondary | ICD-10-CM

## 2018-08-08 MED ORDER — FAMOTIDINE 20 MG PO TABS: 20.0000 mg | ORAL_TABLET | Freq: Every day | ORAL | 5 refills | Status: DC

## 2018-08-08 NOTE — Patient Instructions (Addendum)
RUQ [place] to check for gallstones, gallbladder pathology given her postprandial nausea, intermittent epigastric pain.  She will continue taking omeprazole 40 mg pill but will start taking it on an empty stomach with water 20 to 30 minutes before she drinks coffee or eats anything in the morning  She was stopped taking Tums at bedtime and instead will start taking a Pepcid or famotidine 20 mg pill at bedtime every night  She will call in 5 to 6 weeks to update on her progress.  She understands she might need upper endoscopy if the above is not helpful  You have been scheduled for an abdominal ultrasound at [facility] imaging [address] [place] on 08/15/18  at 8am. Please arrive 15 minutes prior to your appointment for registration. Make certain not to have anything to eat or drink 6 hours prior to your appointment. Should you need to reschedule your appointment, please contact radiology at [phone]. This test typically takes about 30 minutes to perform.  PLEASE BRING A MASK TO YOUR APPOINTMENT

## 2018-08-08 NOTE — Progress Notes (Signed)
Review of pertinent gastrointestinal problems: 1. Dyspepsia;EGD Dr. Christella Hartigan 04/2011 was normal. 10/2013 blood testing + for H. Pylori, treated with appropriate antibiotics by PCP; EGD Dr. Christella Hartigan for dyspepsia, dysphagia 03/2014 found minor h pylori negative gastritis. Follow up HIDA scan showed normal GB EF but + pain with CCK injection. 2. Change in bowel habits: colonoscopy Dr. Christella Hartigan 05/2014; normal TI, normal colon (random bx's normal).   This service was provided via virtual visit.  Both audio and visual were used at first but then stopped functioning and so we completed the visit with just audio the patient was located at home.  I was located in my office.  The patient did consent to this virtual visit and is aware of possible charges through their insurance for this visit.  The patient is an established patient.  My certified medical assistant, Barbaraann Rondo, contributed to this visit by contacting the patient by phone 1 or 2 business days prior to the appointment and also followed up on the recommendations I made after the visit.  Time spent on virtual visit: 22 min   HPI: This is a very pleasant 48 year old woman whom I last saw a little under 3 years ago for dyspepsia  For 3-4 months her reflux has worsened.  She went to see her PCP, changed from omeprazole to pantoprazole which she tried for a month.  She got frustrated and so she completely stopped PPI.  She resumed omeprazole in past few weeks though.  Has cut the edge a bit. Takes it with coffee in the AM.  She usually takes tums at bedtime and occasionally takes a gas ex.  She gets very nauseas almost immediately after eating, any type of food. She has a lot of bloating and hicups and belching and aches in her upper abdomen.  Sometimes vurps a bit.    Has a sensation of dysphagia so solid food, sometimes a globus sensation as well.  When she eats a meal her heart rate increase and she gets flushed.  On Friday the 24th she awoke  with epigastric pain, pretty severe.  This lasted 2-3 hours, very colicky during the 2-3 hours.  She is a Engineer, site, under a lot of stress.  Mother with dementia  Her weight is overall pretty stable.  She takes alleve or ibuproprofen; none in the past month.  Only tylenol.  Previously 3-5 nsaids per week.   ROS: complete GI ROS as described in HPI, all other review negative.  Constitutional:  No unintentional weight loss   Past Medical History:  Diagnosis Date  . Allergy   . Chronic headaches    2/WEEK SINUS AND STRESS HEADACHES  . Deviated nasal septum   . GERD (gastroesophageal reflux disease)   . Hyperlipidemia   . Hypertension    CONTROLLED ON MEDS  . Motion sickness    CAR, AMUSEMENT PARK  . Nasal turbinate hypertrophy    NASAL OBSTRUCTION  . Obesity   . Shortness of breath dyspnea    ON EXERTION  . Sinusitis, chronic     Past Surgical History:  Procedure Laterality Date  . CESAREAN SECTION  2002  . COLONOSCOPY    . FRONTAL SINUS EXPLORATION Bilateral 10/08/2015   Procedure: FRONTAL SINUS EXPLORATION BILATERAL;  Surgeon: Vernie Murders, MD;  Location: [facility];  Service: ENT;  Laterality: Bilateral;  . IMAGE GUIDED SINUS SURGERY N/A 10/08/2015   Procedure: IMAGE GUIDED SINUS SURGERY;  Surgeon: Vernie Murders, MD;  Location: [facility];  Service: ENT;  Laterality: N/A;  GAVE DISK TO CE[facility] 5/24 DEE UPREG  . MAXILLARY ANTROSTOMY Bilateral 10/08/2015   Procedure: MAXILLARY ANTROSTOMY BILATERAL;  Surgeon: Vernie Murders, MD;  Location: [facility];  Service: ENT;  Laterality: Bilateral;  . SEPTOPLASTY N/A 10/08/2015   Procedure: SEPTOPLASTY;  Surgeon: Vernie Murders, MD;  Location: [facility];  Service: ENT;  Laterality: N/A;  . TEE WITHOUT CARDIOVERSION N/A 09/07/2016   Procedure: TRANSESOPHAGEAL ECHOCARDIOGRAM (TEE);  Surgeon: Wendall Stade, MD;  Location: [facility];  Service: Cardiovascular;  Laterality: N/A;  . TURBINATE REDUCTION  Bilateral 10/08/2015   Procedure: TURBINATE REDUCTION BILATERAL;  Surgeon: Vernie Murders, MD;  Location: [facility];  Service: ENT;  Laterality: Bilateral;  . UPPER GASTROINTESTINAL ENDOSCOPY  03-18-2014  . WISDOM TOOTH EXTRACTION      Current Outpatient Medications  Medication Sig Dispense Refill  . ibuprofen (ADVIL,MOTRIN) 200 MG tablet Take 600 mg by mouth every 6 (six) hours as needed for headache or mild pain.     Marland Kitchen lisinopril-hydrochlorothiazide (PRINZIDE,ZESTORETIC) 20-12.5 MG tablet TAKE 1 TABLET BY MOUTH EVERY DAY 90 tablet 0  . omeprazole (PRILOSEC) 40 MG capsule Take 40 mg by mouth daily.    . simethicone (GAS-X) 80 MG chewable tablet Chew 80 mg by mouth as needed for flatulence.     . triamcinolone (NASACORT ALLERGY 24HR) 55 MCG/ACT AERO nasal inhaler Place 2 sprays into the nose every evening.      No current facility-administered medications for this visit.     Allergies as of 08/08/2018 - Review Complete 08/08/2018  Allergen Reaction Noted  . Codeine Nausea And Vomiting 01/04/2011    Family History  Problem Relation Age of Onset  . Depression Mother   . Mental illness Mother   . Hypertension Mother   . Arthritis Father   . Thyroid disease Father   . Mental illness Sister   . Breast cancer Maternal Grandmother   . Diabetes Paternal Grandmother   . Colon cancer Neg Hx   . Stomach cancer Neg Hx     Social History   Socioeconomic History  . Marital status: Married    Spouse name: Not on file  . Number of children: 2  . Years of education: Not on file  . Highest education level: Not on file  Occupational History  . Occupation: Conservation officer, historic buildings: Film/video editor [employer]  Social Needs  . Financial resource strain: Not on file  . Food insecurity:    Worry: Not on file    Inability: Not on file  . Transportation needs:    Medical: Not on file    Non-medical: Not on file  Tobacco Use  . Smoking status: Never Smoker  . Smokeless  tobacco: Never Used  Substance and Sexual Activity  . Alcohol use: Yes    Alcohol/week: 0.0 standard drinks    Comment: rare  . Drug use: No  . Sexual activity: Yes    Partners: Male  Lifestyle  . Physical activity:    Days per week: Not on file    Minutes per session: Not on file  . Stress: Not on file  Relationships  . Social connections:    Talks on phone: Not on file    Gets together: Not on file    Attends religious service: Not on file    Active member of club or organization: Not on file    Attends meetings of clubs or organizations: Not on file    Relationship  status: Not on file  . Intimate partner violence:    Fear of current or ex partner: Not on file    Emotionally abused: Not on file    Physically abused: Not on file    Forced sexual activity: Not on file  Other Topics Concern  . Not on file  Social History Narrative   Regular exercise-yes occasionally   Diet: 3 meals daily, varied, no ff, social etoh,     Physical Exam: Unable to perform because this was a "telemed visit" due to current Covid-19 pandemic  Assessment and plan: 48 y.o. female with postprandial nausea, intermittent epigastric pain, GERD, mild intermittent dysphasia chronic and globus  First I think some of her issues are related to acid, GERD.  I instructed her to take omeprazole a bit differently so that she starts taking it 20 to 30 minutes before coffee or breakfast in the morning.  She will change her bedtime regimen from Tums to an over-the-counter Pepcid nightly.  I am hoping this gives her better acid control and intern hoping that this helps some of her globus and dysphasia.  She will call to report on her response in 5 or 6 weeks.  She understands she might need upper endoscopy if this is not improving.  Her postprandial nausea and the episode of epigastric pain that lasted for 3 hours, very colicky in nature does seem possibly to be due to gallstone disease.  6 years ago she had an  ultrasound that did not show gallstones but I want a repeat that now because she certainly could have made gallstones in the interim.  If she does have gallstones in her gallbladder I would probably refer her to general surgery to consider elective cholecystectomy.  Please see the "Patient Instructions" section for addition details about the plan.  Rob Buntinganiel Jacobs, MD [facility] 08/08/2018, 9:48 AM

## 2018-08-15 ENCOUNTER — Ambulatory Visit
Admission: RE | Admit: 2018-08-15 | Discharge: 2018-08-15 | Disposition: A | Discharge status: Home / Self Care | Payer: BC Managed Care – PPO | Source: Ambulatory Visit | Attending: Gastroenterology | Admitting: Gastroenterology

## 2018-08-15 ENCOUNTER — Other Ambulatory Visit: Payer: Self-pay

## 2018-08-15 DIAGNOSIS — R1013 Epigastric pain: Secondary | ICD-10-CM

## 2018-08-15 DIAGNOSIS — K219 Gastro-esophageal reflux disease without esophagitis: Secondary | ICD-10-CM

## 2018-08-15 DIAGNOSIS — G8929 Other chronic pain: Secondary | ICD-10-CM

## 2018-09-04 LAB — HM MAMMOGRAPHY

## 2018-10-03 ENCOUNTER — Other Ambulatory Visit: Payer: Self-pay | Admitting: Family Medicine

## 2018-10-03 NOTE — Telephone Encounter (Signed)
Please schedule CPE with fasting labs prior with Dr. Bedsole.  

## 2018-10-10 NOTE — Telephone Encounter (Signed)
Lbs 8/31 cpx 9/3 Pt aware

## 2018-10-26 ENCOUNTER — Ambulatory Visit (INDEPENDENT_AMBULATORY_CARE_PROVIDER_SITE_OTHER): Payer: BC Managed Care – PPO | Admitting: Family Medicine

## 2018-10-26 ENCOUNTER — Encounter: Payer: Self-pay | Admitting: Family Medicine

## 2018-10-26 ENCOUNTER — Telehealth: Payer: Self-pay

## 2018-10-26 DIAGNOSIS — Z20822 Contact with and (suspected) exposure to covid-19: Secondary | ICD-10-CM

## 2018-10-26 DIAGNOSIS — Z20828 Contact with and (suspected) exposure to other viral communicable diseases: Secondary | ICD-10-CM

## 2018-10-26 DIAGNOSIS — R0602 Shortness of breath: Secondary | ICD-10-CM | POA: Diagnosis not present

## 2018-10-26 NOTE — Telephone Encounter (Signed)
Pt said she has mild chest pressure and heaviness in mid chest and SOB that started on 10/24/18. Pt has H/A on and off. Pt has been potentially exposed to covid by daughters friend who has been at pts home.pts daughters friend has been tested for covid but no results yet. pts daughters mother of the friend has tested positive. Pt has no dizziness,chills,fever,  cough,S/T, muscle pain, diarrhea and no loss of taste or smell. No traveling. Pt has virtual appt today at 9 AM with Dr Diona Browner. Pt said she was in no distress and did not want to go to UC for eval of chest pressure, heaviness or SOB; pt wanted to discuss with Dr Diona Browner. ED precautions were given and pt voiced understanding.

## 2018-10-26 NOTE — Assessment & Plan Note (Signed)
Very mild.  Will eval for covid.  Symptom care reviewed in detail.  Pt to home isolate.

## 2018-10-26 NOTE — Patient Instructions (Signed)

## 2018-10-26 NOTE — Addendum Note (Signed)
Addended by: Eliezer Lofts E on: 10/26/2018 10:18 AM   Modules accepted: Orders

## 2018-10-26 NOTE — Progress Notes (Signed)
VIRTUAL VISIT Due to national recommendations of social distancing due to COVID 19, a virtual visit is felt to be most appropriate for this patient at this time.   I connected with the patient on 10/26/18 at  9:00 AM EDT by virtual telehealth platform and verified that I am speaking with the correct person using two identifiers.   I discussed the limitations, risks, security and privacy concerns of performing an evaluation and management service by  virtual telehealth platform and the availability of in person appointments. I also discussed with the patient that there may be a patient responsible charge related to this service. The patient expressed understanding and agreed to proceed.  Patient location: Home Provider [place] Participants: Kerby NoraAmy  and Lavonia DraftsAngela B Karel   Chief Complaint  Patient presents with  . Shortness of Breath    and chest pressure/heaviness. sx x 3 days    History of Present Illness: 48 year old female with history of HTN, obesity and anxiety as well as possible covid19 exposure presents with new onset SOB and chest pressure x 3 days.   She reports her daughter was exposed to friend ( close contact) who developed fever, loss of sense and taste on 7/7... has not gotten results yet. Now friends mother  Positive. Mother last had contact with the friend on 10/15/18 Daughter started with symptoms of headache and dx with UTI on 7/15  The patient has very mild intermittent headache  And heaviness in chest. She reports very No fever, mild congestion. No wheeze.  Feels could be sinus allergies.  ROS    Past Medical History:  Diagnosis Date  . Allergy   . Chronic headaches    2/WEEK SINUS AND STRESS HEADACHES  . Deviated nasal septum   . GERD (gastroesophageal reflux disease)   . Hyperlipidemia   . Hypertension    CONTROLLED ON MEDS  . Motion sickness    CAR, AMUSEMENT PARK  . Nasal turbinate hypertrophy    NASAL OBSTRUCTION  . Obesity    . Shortness of breath dyspnea    ON EXERTION  . Sinusitis, chronic     reports that she has never smoked. She has never used smokeless tobacco. She reports current alcohol use. She reports that she does not use drugs.   Current Outpatient Medications:  .  famotidine (PEPCID) 20 MG tablet, Take 1 tablet (20 mg total) by mouth at bedtime., Disp: 30 tablet, Rfl: 5 .  ibuprofen (ADVIL,MOTRIN) 200 MG tablet, Take 600 mg by mouth every 6 (six) hours as needed for headache or mild pain. , Disp: , Rfl:  .  lisinopril-hydrochlorothiazide (ZESTORETIC) 20-12.5 MG tablet, TAKE 1 TABLET BY MOUTH EVERY DAY, Disp: 90 tablet, Rfl: 0 .  omeprazole (PRILOSEC) 40 MG capsule, Take 40 mg by mouth daily., Disp: , Rfl:  .  simethicone (GAS-X) 80 MG chewable tablet, Chew 80 mg by mouth as needed for flatulence. , Disp: , Rfl:  .  triamcinolone (NASACORT ALLERGY 24HR) 55 MCG/ACT AERO nasal inhaler, Place 2 sprays into the nose every evening. , Disp: , Rfl:    Observations/Objective: There were no vitals taken for this visit.  Physical Exam  Physical Exam Constitutional:      General: The patient is not in acute distress. Pulmonary:     Effort: Pulmonary effort is normal. No respiratory distress.  Neurological:     Mental Status: The patient is alert and oriented to person, place, and time.  Psychiatric:  Mood and Affect: Mood normal.        Behavior: Behavior normal.   Assessment and Plan SOB (shortness of breath) Very mild.  Will eval for covid.  Symptom care reviewed in detail.  Pt to home isolate.     I discussed the assessment and treatment plan with the patient. The patient was provided an opportunity to ask questions and all were answered. The patient agreed with the plan and demonstrated an understanding of the instructions.   The patient was advised to call back or seek an in-person evaluation if the symptoms worsen or if the condition fails to improve as anticipated.     Eliezer Lofts, MD

## 2018-11-01 ENCOUNTER — Telehealth: Payer: Self-pay | Admitting: *Deleted

## 2018-11-01 NOTE — Telephone Encounter (Signed)
Pt called Triage asking about Covid-19 results. I did advise her that they are not back yet. Pt would like a call or have the released on Mychart asap once they come back FYI to PCP

## 2018-11-03 LAB — NOVEL CORONAVIRUS, NAA: SARS-CoV-2, NAA: NOT DETECTED

## 2018-12-03 ENCOUNTER — Telehealth: Payer: Self-pay

## 2018-12-03 ENCOUNTER — Other Ambulatory Visit: Payer: Self-pay | Admitting: Family Medicine

## 2018-12-03 NOTE — Telephone Encounter (Signed)

## 2018-12-04 ENCOUNTER — Encounter: Payer: Self-pay | Admitting: Plastic Surgery

## 2018-12-04 ENCOUNTER — Other Ambulatory Visit: Payer: Self-pay

## 2018-12-04 ENCOUNTER — Ambulatory Visit: Payer: BC Managed Care – PPO | Admitting: Plastic Surgery

## 2018-12-04 VITALS — BP 115/79 | HR 83 | Temp 97.8°F | Ht 65.0 in | Wt 233.2 lb

## 2018-12-04 DIAGNOSIS — N62 Hypertrophy of breast: Secondary | ICD-10-CM | POA: Diagnosis not present

## 2018-12-04 DIAGNOSIS — M546 Pain in thoracic spine: Secondary | ICD-10-CM | POA: Diagnosis not present

## 2018-12-04 DIAGNOSIS — G8929 Other chronic pain: Secondary | ICD-10-CM | POA: Diagnosis not present

## 2018-12-04 DIAGNOSIS — G43009 Migraine without aura, not intractable, without status migrainosus: Secondary | ICD-10-CM | POA: Diagnosis not present

## 2018-12-04 DIAGNOSIS — M549 Dorsalgia, unspecified: Secondary | ICD-10-CM | POA: Insufficient documentation

## 2018-12-04 MED ORDER — FAMOTIDINE 20 MG PO TABS: 20.0000 mg | ORAL_TABLET | Freq: Every day | ORAL | 5 refills | Status: DC

## 2018-12-04 NOTE — Progress Notes (Signed)
Fax request to refill famotidine 20mg  qhs

## 2018-12-04 NOTE — Progress Notes (Signed)
Patient ID: Jane Li, female    DOB: 08/21/1970, 48 y.o.   MRN: [id number]   Chief Complaint  Patient presents with  . Breast Problem    Mammary Hyperplasia: The patient is a 48 y.o. female with a history of mammary hyperplasia for several years.  She has extremely large breasts causing symptoms that include the following: Back pain (upper and lower) and neck pain. She frequently pins bra cups higher on straps for better lift and relief. Notices relief when holding breast up in her hands. Shoulder straps causing grooves, pain occasionally requiring padding. Pain medication is sometimes required with motrin and tylenol.  Activities that are hindered by enlarged breasts include: Exercise and daily activities without pain.  Her breasts are extremely large and fairly symmetric.  She has hyperpigmentation of the inframammary area on both sides.  The sternal to nipple distance on the right is 35 cm and the left is 34 cm.  The IMF distance is 16 cm.  She is 5 feet 5 inches tall and weighs 233 pounds.  Preoperative bra size = 38 DDD cup.  She would like to be a C cup.  The estimated excess breast tissue to be removed at the time of surgery = 750 grams on the left and 750 grams on the right.  Mammogram history: [facility] and [facility].  She has also been seen for the back and neck pain by a chiropractor without significant improvement.   Review of Systems  Constitutional: Positive for activity change. Negative for appetite change.  HENT: Negative for dental problem.   Eyes: Negative for discharge.  Respiratory: Negative for chest tightness and shortness of breath.   Cardiovascular: Negative for leg swelling.  Gastrointestinal: Negative for abdominal pain.  Endocrine: Negative.   Genitourinary: Negative.   Musculoskeletal: Positive for back pain and neck pain.  Skin: Negative for color change and wound.  Neurological: Negative.  Negative for  facial asymmetry.  Hematological: Negative.   Psychiatric/Behavioral: Negative.     Past Medical History:  Diagnosis Date  . Allergy   . Chronic headaches    2/WEEK SINUS AND STRESS HEADACHES  . Deviated nasal septum   . GERD (gastroesophageal reflux disease)   . Hyperlipidemia   . Hypertension    CONTROLLED ON MEDS  . Motion sickness    CAR, AMUSEMENT PARK  . Nasal turbinate hypertrophy    NASAL OBSTRUCTION  . Obesity   . Shortness of breath dyspnea    ON EXERTION  . Sinusitis, chronic     Past Surgical History:  Procedure Laterality Date  . CESAREAN SECTION  2002  . COLONOSCOPY    . FRONTAL SINUS EXPLORATION Bilateral 10/08/2015   Procedure: FRONTAL SINUS EXPLORATION BILATERAL;  Surgeon: Vernie MurdersPaul Juengel, MD;  [facility];  Service: ENT;  Laterality: Bilateral;  . IMAGE GUIDED SINUS SURGERY N/A 10/08/2015   Procedure: IMAGE GUIDED SINUS SURGERY;  Surgeon: Vernie MurdersPaul Juengel, MD;  [facility];  Service: ENT;  Laterality: N/A;  GAVE DISK TO CE[facility] 5/24 DEE UPREG  . MAXILLARY ANTROSTOMY Bilateral 10/08/2015   Procedure: MAXILLARY ANTROSTOMY BILATERAL;  Surgeon: Vernie MurdersPaul Juengel, MD;  [facility];  Service: ENT;  Laterality: Bilateral;  . SEPTOPLASTY N/A 10/08/2015   Procedure: SEPTOPLASTY;  Surgeon: Vernie MurdersPaul Juengel, MD;  [facility];  Service: ENT;  Laterality: N/A;  . TEE WITHOUT CARDIOVERSION N/A 09/07/2016   Procedure: TRANSESOPHAGEAL ECHOCARDIOGRAM (TEE);  Surgeon: Josue Hector, MD;  Location: [facility];  Service: Cardiovascular;  Laterality: N/A;  . TURBINATE REDUCTION Bilateral 10/08/2015   Procedure: TURBINATE REDUCTION BILATERAL;  Surgeon: Margaretha Sheffield, MD;  Location: [place];  Service: ENT;  Laterality: Bilateral;  . UPPER GASTROINTESTINAL ENDOSCOPY  03-18-2014  . WISDOM TOOTH EXTRACTION        Current Outpatient Medications:  .  famotidine (PEPCID) 20 MG tablet, Take 1 tablet (20 mg total) by mouth  at bedtime., Disp: 30 tablet, Rfl: 5 .  ibuprofen (ADVIL,MOTRIN) 200 MG tablet, Take 600 mg by mouth every 6 (six) hours as needed for headache or mild pain. , Disp: , Rfl:  .  lisinopril-hydrochlorothiazide (ZESTORETIC) 20-12.5 MG tablet, TAKE 1 TABLET BY MOUTH EVERY DAY, Disp: 90 tablet, Rfl: 0 .  omeprazole (PRILOSEC) 40 MG capsule, TAKE 1 CAPSULE BY MOUTH EVERY DAY (Patient taking differently: Take 40 mg by mouth every other day. ), Disp: 90 capsule, Rfl: 0 .  triamcinolone (NASACORT ALLERGY 24HR) 55 MCG/ACT AERO nasal inhaler, Place 2 sprays into the nose every evening. , Disp: , Rfl:    Objective:   Vitals:   12/04/18 1548  BP: 115/79  Pulse: 83  Temp: 97.8 F (36.6 C)  SpO2: 96%    Physical Exam Vitals signs and nursing note reviewed.  Constitutional:      Appearance: Normal appearance.  HENT:     Head: Normocephalic and atraumatic.  Eyes:     Extraocular Movements: Extraocular movements intact.  Cardiovascular:     Rate and Rhythm: Normal rate.     Pulses: Normal pulses.  Pulmonary:     Effort: Pulmonary effort is normal. No respiratory distress.     Breath sounds: No wheezing.  Abdominal:     General: Abdomen is flat. There is no distension.     Tenderness: There is no abdominal tenderness.  Skin:    Capillary Refill: Capillary refill takes less than 2 seconds.  Neurological:     General: No focal deficit present.     Mental Status: She is alert and oriented to person, place, and time.  Psychiatric:        Mood and Affect: Mood normal.        Behavior: Behavior normal.        Thought Content: Thought content normal.        Judgment: Judgment normal.     Assessment & Plan:  Migraine without aura and without status migrainosus, not intractable  Chronic bilateral thoracic back pain  Symptomatic mammary hypertrophy  Recommend bilateral breast reduction with lateral liposuction.  May be able to have a superior medial pedicle technique. Patient will have her  mammogram results sent to [place]. Pictures were obtained of the patient and placed in the chart with the patient's or guardian's permission.   [place], DO

## 2018-12-06 ENCOUNTER — Telehealth: Payer: Self-pay

## 2018-12-06 NOTE — Telephone Encounter (Signed)
Left detailed VM w COVID screen and back door lab info   

## 2018-12-08 ENCOUNTER — Telehealth: Payer: Self-pay | Admitting: Family Medicine

## 2018-12-08 DIAGNOSIS — E7800 Pure hypercholesterolemia, unspecified: Secondary | ICD-10-CM

## 2018-12-08 NOTE — Telephone Encounter (Signed)
-----   Message from Ellamae Sia sent at 12/04/2018  9:54 AM EDT ----- Regarding: Lab orders for Monday, 8.31.20 Patient is scheduled for CPX labs, please order future labs, Thanks , Karna Christmas

## 2018-12-08 NOTE — Telephone Encounter (Signed)
-----   Message from Terri J Walsh sent at 12/04/2018  9:54 AM EDT ----- Regarding: Lab orders for Monday, 8.31.20 Patient is scheduled for CPX labs, please order future labs, Thanks , Terri  

## 2018-12-10 ENCOUNTER — Other Ambulatory Visit: Payer: Self-pay

## 2018-12-10 ENCOUNTER — Other Ambulatory Visit (INDEPENDENT_AMBULATORY_CARE_PROVIDER_SITE_OTHER): Payer: BC Managed Care – PPO

## 2018-12-10 DIAGNOSIS — E7800 Pure hypercholesterolemia, unspecified: Secondary | ICD-10-CM | POA: Diagnosis not present

## 2018-12-10 LAB — COMPREHENSIVE METABOLIC PANEL
ALT: 18 U/L (ref 0–35)
AST: 15 U/L (ref 0–37)
Albumin: 4.2 g/dL (ref 3.5–5.2)
Alkaline Phosphatase: 57 U/L (ref 39–117)
BUN: 18 mg/dL (ref 6–23)
CO2: 28 mEq/L (ref 19–32)
Calcium: 9.7 mg/dL (ref 8.4–10.5)
Chloride: 104 mEq/L (ref 96–112)
Creatinine, Ser: 0.94 mg/dL (ref 0.40–1.20)
GFR: 62.89 mL/min (ref >60.00)
Glucose, Bld: 100 mg/dL — ABNORMAL HIGH (ref 70–99)
Potassium: 4.3 mEq/L (ref 3.5–5.1)
Sodium: 140 mEq/L (ref 135–145)
Total Bilirubin: 0.3 mg/dL (ref 0.2–1.2)
Total Protein: 7 g/dL (ref 6.0–8.3)

## 2018-12-10 LAB — LIPID PANEL
Cholesterol: 238 mg/dL — ABNORMAL HIGH (ref 0–200)
HDL: 59.5 mg/dL (ref >39.00)
LDL Cholesterol: 155 mg/dL — ABNORMAL HIGH (ref 0–99)
NonHDL: 178.7
Total CHOL/HDL Ratio: 4
Triglycerides: 119 mg/dL (ref 0.0–149.0)
VLDL: 23.8 mg/dL (ref 0.0–40.0)

## 2018-12-11 NOTE — Progress Notes (Signed)
No critical labs need to be addressed urgently. We will discuss labs in detail at upcoming office visit.   

## 2018-12-13 ENCOUNTER — Other Ambulatory Visit: Payer: Self-pay

## 2018-12-13 ENCOUNTER — Encounter: Payer: Self-pay | Admitting: Family Medicine

## 2018-12-13 ENCOUNTER — Ambulatory Visit (INDEPENDENT_AMBULATORY_CARE_PROVIDER_SITE_OTHER): Payer: BC Managed Care – PPO | Admitting: Family Medicine

## 2018-12-13 VITALS — BP 118/80 | HR 87 | Temp 98.2°F | Ht 65.5 in | Wt 230.5 lb

## 2018-12-13 DIAGNOSIS — E7800 Pure hypercholesterolemia, unspecified: Secondary | ICD-10-CM

## 2018-12-13 DIAGNOSIS — F411 Generalized anxiety disorder: Secondary | ICD-10-CM

## 2018-12-13 DIAGNOSIS — Z0000 Encounter for general adult medical examination without abnormal findings: Secondary | ICD-10-CM | POA: Diagnosis not present

## 2018-12-13 DIAGNOSIS — K219 Gastro-esophageal reflux disease without esophagitis: Secondary | ICD-10-CM

## 2018-12-13 DIAGNOSIS — I10 Essential (primary) hypertension: Secondary | ICD-10-CM

## 2018-12-13 DIAGNOSIS — M542 Cervicalgia: Secondary | ICD-10-CM

## 2018-12-13 DIAGNOSIS — R7303 Prediabetes: Secondary | ICD-10-CM

## 2018-12-13 NOTE — Patient Instructions (Addendum)
Fat and Cholesterol Restricted Eating Plan Eating a diet that limits fat and cholesterol may help lower your risk for heart disease and other conditions. Your body needs fat and cholesterol for basic functions, but eating too much of these things can be harmful to your health. Your health care provider may order lab tests to check your blood fat (lipid) and cholesterol levels. This helps your health care provider understand your risk for certain conditions and whether you need to make diet changes. Work with your health care provider or dietitian to make an eating plan that is right for you.  What are tips for following this plan? General guidelines   If you are overweight, work with your health care provider to lose weight safely. Losing just 5-10% of your body weight can improve your overall health and help prevent diseases such as diabetes and heart disease.  Avoid: ? Foods with added sugar. ? Fried foods. ? Foods that contain partially hydrogenated oils, including stick margarine, some tub margarines, cookies, crackers, and other baked goods.  Limit alcohol intake to no more than 1 drink a day for nonpregnant women and 2 drinks a day for men. One drink equals 12 oz of beer, 5 oz of wine, or 1 oz of hard liquor. Reading food labels  Check food labels for: ? Trans fats, partially hydrogenated oils, or high amounts of saturated fat. Avoid foods that contain saturated fat and trans fat. ? The amount of cholesterol in each serving. Try to eat no more than 200 mg of cholesterol each day. ? The amount of fiber in each serving. Try to eat at least 20-30 g of fiber each day.  Choose foods with healthy fats, such as: ? Monounsaturated and polyunsaturated fats. These include olive and canola oil, flaxseeds, walnuts, almonds, and seeds. ? Omega-3 fats. These are found in foods such as salmon, mackerel, sardines, tuna, flaxseed oil, and ground flaxseeds.  Choose grain products that have  whole grains. Look for the word "whole" as the first word in the ingredient list. Cooking  Cook foods using methods other than frying. Baking, boiling, grilling, and broiling are some healthy options.  Eat more home-cooked food and less restaurant, buffet, and fast food.  Avoid cooking using saturated fats. ? Animal sources of saturated fats include meats, butter, and cream. ? Plant sources of saturated fats include palm oil, palm kernel oil, and coconut oil. Meal planning   At meals, imagine dividing your plate into fourths: ? Fill one-half of your plate with vegetables and green salads. ? Fill one-fourth of your plate with whole grains. ? Fill one-fourth of your plate with lean protein foods.  Eat fish that is high in omega-3 fats at least two times a week.  Eat more foods that contain fiber, such as whole grains, beans, apples, broccoli, carrots, peas, and barley. These foods help promote healthy cholesterol levels in the blood. Recommended foods Grains  Whole grains, such as whole wheat or whole grain breads, crackers, cereals, and pasta. Unsweetened oatmeal, bulgur, barley, quinoa, or brown rice. Corn or whole wheat flour tortillas. Vegetables  Fresh or frozen vegetables (raw, steamed, roasted, or grilled). Green salads. Fruits  All fresh, canned (in natural juice), or frozen fruits. Meats and other protein foods  Ground beef (85% or leaner), grass-fed beef, or beef trimmed of fat. Skinless chicken or [place]. Ground chicken or [place]. Pork trimmed of fat. All fish and seafood. Egg whites. Dried beans, peas, or lentils. Unsalted nuts or  seeds. Unsalted canned beans. Natural nut butters without added sugar and oil. Dairy  Low-fat or nonfat dairy products, such as skim or 1% milk, 2% or reduced-fat cheeses, low-fat and fat-free ricotta or cottage cheese, or plain low-fat and nonfat yogurt. Fats and oils  Tub margarine without trans fats. Light or reduced-fat mayonnaise and  salad dressings. Avocado. Olive, canola, sesame, or safflower oils. The items listed above may not be a complete list of recommended foods or beverages. Contact your dietitian for more options. Foods to avoid Grains  White bread. White pasta. White rice. Cornbread. Bagels, pastries, and croissants. Crackers and snack foods that contain trans fat and hydrogenated oils. Vegetables  Vegetables cooked in cheese, cream, or butter sauce. Fried vegetables. Fruits  Canned fruit in heavy syrup. Fruit in cream or butter sauce. Fried fruit. Meats and other protein foods  Fatty cuts of meat. Ribs, chicken wings, bacon, sausage, bologna, salami, chitterlings, fatback, hot dogs, bratwurst, and packaged lunch meats. Liver and organ meats. Whole eggs and egg yolks. Chicken and [place] with skin. Fried meat. Dairy  Whole or 2% milk, cream, half-and-half, and cream cheese. Whole milk cheeses. Whole-fat or sweetened yogurt. Full-fat cheeses. Nondairy creamers and whipped toppings. Processed cheese, cheese spreads, and cheese curds. Beverages  Alcohol. Sugar-sweetened drinks such as sodas, lemonade, and fruit drinks. Fats and oils  Butter, stick margarine, lard, shortening, ghee, or bacon fat. Coconut, palm kernel, and palm oils. Sweets and desserts  Corn syrup, sugars, honey, and molasses. Candy. Jam and jelly. Syrup. Sweetened cereals. Cookies, pies, cakes, donuts, muffins, and ice cream. The items listed above may not be a complete list of foods and beverages to avoid. Contact your dietitian for more information. Summary  Your body needs fat and cholesterol for basic functions. However, eating too much of these things can be harmful to your health.  Work with your health care provider and dietitian to follow a diet low in fat and cholesterol. Doing this may help lower your risk for heart disease and other conditions.  Choose healthy fats, such as monounsaturated and polyunsaturated fats, and foods  high in omega-3 fatty acids.  Eat fiber-rich foods, such as whole grains, beans, peas, fruits, and vegetables.  Limit or avoid alcohol, fried foods, and foods high in saturated fats, partially hydrogenated oils, and sugar. This information is not intended to replace advice given to you by your health care provider. Make sure you discuss any questions you have with your health care provider. Document Released: 03/28/2005 Document Revised: 03/10/2017 Document Reviewed: 12/13/2016 Elsevier Patient Education  2020 [employer] Preventive Care 93-51 Years Old, Female Preventive care refers to visits with your health care provider and lifestyle choices that can promote health and wellness. This includes:  A yearly physical exam. This may also be called an annual well check.  Regular dental visits and eye exams.  Immunizations.  Screening for certain conditions.  Healthy lifestyle choices, such as eating a healthy diet, getting regular exercise, not using drugs or products that contain nicotine and tobacco, and limiting alcohol use. What can I expect for my preventive care visit? Physical exam Your health care provider will check your:  Height and weight. This may be used to calculate body mass index (BMI), which tells if you are at a healthy weight.  Heart rate and blood pressure.  Skin for abnormal spots. Counseling Your health care provider may ask you questions about your:  Alcohol, tobacco, and drug use.  Emotional well-being.  Home and relationship  well-being.  Sexual activity.  Eating habits.  Work and work Statistician.  Method of birth control.  Menstrual cycle.  Pregnancy history. What immunizations do I need?  Influenza (flu) vaccine  This is recommended every year. Tetanus, diphtheria, and pertussis (Tdap) vaccine  You may need a Td booster every 10 years. Varicella (chickenpox) vaccine  You may need this if you have not been vaccinated. Zoster  (shingles) vaccine  You may need this after age 82. Measles, mumps, and rubella (MMR) vaccine  You may need at least one dose of MMR if you were born in 1957 or later. You may also need a second dose. Pneumococcal conjugate (PCV13) vaccine  You may need this if you have certain conditions and were not previously vaccinated. Pneumococcal polysaccharide (PPSV23) vaccine  You may need one or two doses if you smoke cigarettes or if you have certain conditions. Meningococcal conjugate (MenACWY) vaccine  You may need this if you have certain conditions. Hepatitis A vaccine  You may need this if you have certain conditions or if you travel or work in places where you may be exposed to hepatitis A. Hepatitis B vaccine  You may need this if you have certain conditions or if you travel or work in places where you may be exposed to hepatitis B. Haemophilus influenzae type b (Hib) vaccine  You may need this if you have certain conditions. Human papillomavirus (HPV) vaccine  If recommended by your health care provider, you may need three doses over 6 months. You may receive vaccines as individual doses or as more than one vaccine together in one shot (combination vaccines). Talk with your health care provider about the risks and benefits of combination vaccines. What tests do I need? Blood tests  Lipid and cholesterol levels. These may be checked every 5 years, or more frequently if you are over 14 years old.  Hepatitis C test.  Hepatitis B test. Screening  Lung cancer screening. You may have this screening every year starting at age 32 if you have a 30-pack-year history of smoking and currently smoke or have quit within the past 15 years.  Colorectal cancer screening. All adults should have this screening starting at age 49 and continuing until age 77. Your health care provider may recommend screening at age 34 if you are at increased risk. You will have tests every 1-10 years, depending  on your results and the type of screening test.  Diabetes screening. This is done by checking your blood sugar (glucose) after you have not eaten for a while (fasting). You may have this done every 1-3 years.  Mammogram. This may be done every 1-2 years. Talk with your health care provider about when you should start having regular mammograms. This may depend on whether you have a family history of breast cancer.  BRCA-related cancer screening. This may be done if you have a family history of breast, ovarian, tubal, or peritoneal cancers.  Pelvic exam and Pap test. This may be done every 3 years starting at age 65. Starting at age 44, this may be done every 5 years if you have a Pap test in combination with an HPV test. Other tests  Sexually transmitted disease (STD) testing.  Bone density scan. This is done to screen for osteoporosis. You may have this scan if you are at high risk for osteoporosis. Follow these instructions at home: Eating and drinking  Eat a diet that includes fresh fruits and vegetables, whole grains, lean protein, and low-fat  dairy.  Take vitamin and mineral supplements as recommended by your health care provider.  Do not drink alcohol if: ? Your health care provider tells you not to drink. ? You are pregnant, may be pregnant, or are planning to become pregnant.  If you drink alcohol: ? Limit how much you have to 0-1 drink a day. ? Be aware of how much alcohol is in your drink. In the [place] one drink equals one 12 oz bottle of beer (355 mL), one 5 oz glass of wine (148 mL), or one 1 oz glass of hard liquor (44 mL). Lifestyle  Take daily care of your teeth and gums.  Stay active. Exercise for at least 30 minutes on 5 or more days each week.  Do not use any products that contain nicotine or tobacco, such as cigarettes, e-cigarettes, and chewing tobacco. If you need help quitting, ask your health care provider.  If you are sexually active, practice safe sex. Use  a condom or other form of birth control (contraception) in order to prevent pregnancy and STIs (sexually transmitted infections).  If told by your health care provider, take low-dose aspirin daily starting at age 71. What's next?  Visit your health care provider once a year for a well check visit.  Ask your health care provider how often you should have your eyes and teeth checked.  Stay up to date on all vaccines. This information is not intended to replace advice given to you by your health care provider. Make sure you discuss any questions you have with your health care provider. Document Released: 04/24/2015 Document Revised: 12/07/2017 Document Reviewed: 12/07/2017 Elsevier Patient Education  2020 [employer].

## 2018-12-13 NOTE — Progress Notes (Signed)
Chief Complaint  Patient presents with  . Annual Exam    History of Present Illness: HPI  The patient is here for annual wellness exam and preventative care.    Hypertension:    Good control on lisinopril HCTZ. BP Readings from Last 3 Encounters:  12/13/18 118/80  12/04/18 115/79  08/08/18 100/70  Using medication without problems or lightheadedness: none Chest pain with exertion:none Edema:none Short of breath:none Average home BPs: Other issues:  Elevated Cholesterol:  1.6% risk of of SVD in 10 years per calculator. Lab Results  Component Value Date   CHOL 238 (H) 12/10/2018   HDL 59.50 12/10/2018   LDLCALC 155 (H) 12/10/2018   LDLDIRECT 146.6 05/06/2013   TRIG 119.0 12/10/2018   CHOLHDL 4 12/10/2018  Using medications without problems: Muscle aches:  Diet compliance: moderate Exercise: She has started boot camp 4 weeks ago. Other complaints: Improved energy. Wt Readings from Last 3 Encounters:  12/13/18 230 lb 8 oz (104.6 kg)  12/04/18 233 lb 3.2 oz (105.8 kg)  08/08/18 225 lb (102.1 kg)    GERD: tolerable control on PPI and pepcid.  GAD:  Stable control   Office Visit from 12/13/2018 in [place] at [facility] Total Score  0     COVID 19 screen No recent travel or known exposure to COVID19 The patient denies respiratory symptoms of COVID 19 at this time.  The importance of social distancing was discussed today.   Review of Systems  Constitutional: Negative for chills and fever.  HENT: Negative for congestion and ear pain.   Eyes: Negative for pain and redness.  Respiratory: Negative for cough and shortness of breath.   Cardiovascular: Negative for chest pain, palpitations and leg swelling.  Gastrointestinal: Negative for abdominal pain, blood in stool, constipation, diarrhea, nausea and vomiting.  Genitourinary: Negative for dysuria.  Musculoskeletal: Negative for falls and myalgias.  Skin: Negative for rash.  Neurological: Negative  for dizziness.  Psychiatric/Behavioral: Negative for depression. The patient is not nervous/anxious.       Past Medical History:  Diagnosis Date  . Allergy   . Chronic headaches    2/WEEK SINUS AND STRESS HEADACHES  . Deviated nasal septum   . GERD (gastroesophageal reflux disease)   . Hyperlipidemia   . Hypertension    CONTROLLED ON MEDS  . Motion sickness    [employer], [facility]  . Nasal turbinate hypertrophy    NASAL OBSTRUCTION  . Obesity   . Shortness of breath dyspnea    ON EXERTION  . Sinusitis, chronic     reports that she has never smoked. She has never used smokeless tobacco. She reports current alcohol use. She reports that she does not use drugs.   Current Outpatient Medications:  .  famotidine (PEPCID) 20 MG tablet, Take 1 tablet (20 mg total) by mouth at bedtime., Disp: 30 tablet, Rfl: 5 .  ibuprofen (ADVIL,MOTRIN) 200 MG tablet, Take 600 mg by mouth every 6 (six) hours as needed for headache or mild pain. , Disp: , Rfl:  .  lisinopril-hydrochlorothiazide (ZESTORETIC) 20-12.5 MG tablet, TAKE 1 TABLET BY MOUTH EVERY DAY, Disp: 90 tablet, Rfl: 0 .  omeprazole (PRILOSEC) 40 MG capsule, TAKE 1 CAPSULE BY MOUTH EVERY DAY (Patient taking differently: Take 40 mg by mouth every other day. ), Disp: 90 capsule, Rfl: 0 .  triamcinolone (NASACORT ALLERGY 24HR) 55 MCG/ACT AERO nasal inhaler, Place 2 sprays into the nose every evening. , Disp: , Rfl:    Observations/Objective:  Blood pressure 118/80, pulse 87, temperature 98.2 F (36.8 C), temperature source Temporal, height 5' 5.5" (1.664 m), weight 230 lb 8 oz (104.6 kg), SpO2 96 %.   Physical Exam Constitutional:      General: She is not in acute distress.    Appearance: Normal appearance. She is well-developed. She is not ill-appearing or toxic-appearing.  HENT:     Head: Normocephalic.     Right Ear: Hearing, tympanic membrane, ear canal and external ear normal.     Left Ear: Hearing, tympanic membrane, ear canal and  external ear normal.     Nose: Nose normal.  Eyes:     General: Lids are normal. Lids are everted, no foreign bodies appreciated.     Conjunctiva/sclera: Conjunctivae normal.     Pupils: Pupils are equal, round, and reactive to light.  Neck:     Musculoskeletal: Normal range of motion and neck supple.     Thyroid: No thyroid mass or thyromegaly.     Vascular: No carotid bruit.     Trachea: Trachea normal.  Cardiovascular:     Rate and Rhythm: Normal rate and regular rhythm.     Heart sounds: Normal heart sounds, S1 normal and S2 normal. No murmur. No gallop.   Pulmonary:     Effort: Pulmonary effort is normal. No respiratory distress.     Breath sounds: Normal breath sounds. No wheezing, rhonchi or rales.  Abdominal:     General: Bowel sounds are normal. There is no distension or abdominal bruit.     Palpations: Abdomen is soft. There is no fluid wave or mass.     Tenderness: There is no abdominal tenderness. There is no guarding or rebound.     Hernia: No hernia is present.  Lymphadenopathy:     Cervical: No cervical adenopathy.  Skin:    General: Skin is warm and dry.     Findings: No rash.  Neurological:     Mental Status: She is alert.     Cranial Nerves: No cranial nerve deficit.     Sensory: No sensory deficit.  Psychiatric:        Mood and Affect: Mood is not anxious or depressed.        Speech: Speech normal.        Behavior: Behavior normal. Behavior is cooperative.        Judgment: Judgment normal.      Assessment and Plan The patient's preventative maintenance and recommended screening tests for an annual wellness exam were reviewed in full today. Brought up to date unless services declined.  Counselled on the importance of diet, exercise, and its role in overall health and mortality. The patient's FH and SH was reviewed, including their home life, tobacco status, and drug and alcohol status.   Vaccines:Td uptodate, due for flu Pap/DVE:  2019 pap  GYN nml  repeat 5 years Mammo:  Neg at GYN. Colon:  2016 neg.. repeat in 10 years. Dr. Larae GroomsJacob's Smoking Status:none ETOH/ drug YQM:VHQI/ONGEuse:none/none  HIV screen:  declined  Prediabetes  Work on low carbohydrate diet.  HTN (hypertension) Well controlled. Continue current medication. Encouraged exercise, weight loss, healthy eating habits.   Neck pain Rx for massage therapy given.  High cholesterol Low cholesterol diet. Keep up working on exercise and weihgt loss.     Kerby NoraAmy Bedsole, MD

## 2018-12-13 NOTE — Assessment & Plan Note (Signed)
Low cholesterol diet. Keep up working on exercise and weihgt loss.

## 2018-12-13 NOTE — Assessment & Plan Note (Signed)
Work on low carbohydrate diet.

## 2018-12-13 NOTE — Assessment & Plan Note (Signed)
Well controlled. Continue current medication. Encouraged exercise, weight loss, healthy eating habits.  

## 2018-12-13 NOTE — Assessment & Plan Note (Signed)
Rx for massage therapy given.

## 2018-12-18 ENCOUNTER — Telehealth: Payer: Self-pay | Admitting: Plastic Surgery

## 2018-12-18 NOTE — Telephone Encounter (Signed)
Received a phone call from [place] at [facility] inquiring about conservative treatment measures that have been attempted to resolve neck and back pain, as well as any documentation regarding weight management. Called patient to inquire about this information. Patient will have chiropractic notes sent to our office, but I have suggested starting Physical Therapy in the mean time just to have another documented attempt of conservative treatment.   I also encouraged her to continue pursuing her weight loss goals, since she has recently joined a boot camp/gym and is trying to reduce her weight through reasonable means. Patient agreed to hold off on resubmitting the surgery request until after January 1. She will contact our office in January to set up a follow up visit, and we will re-attempt the prior authorization request with [employer]. Patient was in grateful for the information and we will work together to better the chances of approval for surgery.

## 2019-01-03 ENCOUNTER — Other Ambulatory Visit: Payer: Self-pay | Admitting: Family Medicine

## 2019-02-25 ENCOUNTER — Other Ambulatory Visit: Payer: Self-pay | Admitting: Family Medicine

## 2019-03-13 ENCOUNTER — Other Ambulatory Visit: Payer: Self-pay

## 2019-03-13 DIAGNOSIS — Z20822 Contact with and (suspected) exposure to covid-19: Secondary | ICD-10-CM

## 2019-03-15 LAB — NOVEL CORONAVIRUS, NAA: SARS-CoV-2, NAA: DETECTED — AB

## 2019-03-20 ENCOUNTER — Telehealth: Payer: Self-pay | Admitting: *Deleted

## 2019-03-20 NOTE — Telephone Encounter (Signed)
Patient called stating that she has been diagnosed with covid. Patient stated that her symptoms were mild to start with. Patient stated that she had a mild headache, lost of smell and taste. Patient stated that the Health Department called her and she was telling them her new onset of symptoms so they advised her to call her PCP. Patient stated that she now has diarrhea and feels that there are circulation problems in her hands and feet at times. Patient is concerned that she may be getting dehydrated. Information was given to patient to contact the new [facility] in [place]. Patient stated that she is going to reach out to them about possibly going there to be evaluated.  ER precautions given to patient and she verbalized understanding.

## 2019-03-21 NOTE — Telephone Encounter (Signed)
Noted. Call pt today for update.. I do not see an urgent care note in computer yet. She can do virtual visit woith me unless clear reason for in person eval like SOB.

## 2019-03-21 NOTE — Telephone Encounter (Signed)
Spoke with Jane Li.  She did not go to Urgent Care yesterday.  She states she feels like she is on the mend.  She states she is mostly fatigued and has SOB on exertion, like when she walks the dog or climbs the stairs.  She still has some nasal congestion. She denies any fever or headache.  She states her taste and smell is back at about 50 %.  No loose stools today. She states she has been reading on the internet and thinks that has stressed her out and caused her anxiety to be worse. She also wanted to let Dr. Diona Browner know that she has been doing the Ketogenic diet for about a month now.  She has loss about 14 to 16 lbs.  She states her and Dr. Diona Browner discussed this diet at her last visit and Dr. Diona Browner mentioned keeping a check on her lab levels.  She would like to get this done once she is over Covid but wasn't sure when or how often she should have her labs checked.  Please advise.

## 2019-03-21 NOTE — Telephone Encounter (Signed)
April notified as instructed by telephone.  Fasting lab appointment scheduled to 04/18/2019 at 8:30 am.

## 2019-03-21 NOTE — Telephone Encounter (Signed)
Schedule chol labs in 1 month, fasting.

## 2019-03-28 ENCOUNTER — Other Ambulatory Visit: Payer: Self-pay

## 2019-03-28 MED ORDER — FAMOTIDINE 20 MG PO TABS: 20.0000 mg | ORAL_TABLET | Freq: Every day | ORAL | 5 refills | Status: DC

## 2019-03-28 NOTE — Telephone Encounter (Signed)
Famotidine refilled as pharmacy requested. 

## 2019-04-18 ENCOUNTER — Other Ambulatory Visit (INDEPENDENT_AMBULATORY_CARE_PROVIDER_SITE_OTHER): Payer: BC Managed Care – PPO

## 2019-04-18 ENCOUNTER — Other Ambulatory Visit: Payer: Self-pay

## 2019-04-18 ENCOUNTER — Telehealth: Payer: Self-pay | Admitting: Family Medicine

## 2019-04-18 DIAGNOSIS — E7800 Pure hypercholesterolemia, unspecified: Secondary | ICD-10-CM

## 2019-04-18 LAB — LIPID PANEL
Cholesterol: 286 mg/dL — ABNORMAL HIGH (ref 0–200)
HDL: 57.2 mg/dL (ref >39.00)
LDL Cholesterol: 205 mg/dL — ABNORMAL HIGH (ref 0–99)
NonHDL: 228.76
Total CHOL/HDL Ratio: 5
Triglycerides: 118 mg/dL (ref 0.0–149.0)
VLDL: 23.6 mg/dL (ref 0.0–40.0)

## 2019-04-18 LAB — COMPREHENSIVE METABOLIC PANEL
ALT: 17 U/L (ref 0–35)
AST: 15 U/L (ref 0–37)
Albumin: 4.4 g/dL (ref 3.5–5.2)
Alkaline Phosphatase: 53 U/L (ref 39–117)
BUN: 13 mg/dL (ref 6–23)
CO2: 30 mEq/L (ref 19–32)
Calcium: 9.6 mg/dL (ref 8.4–10.5)
Chloride: 101 mEq/L (ref 96–112)
Creatinine, Ser: 0.73 mg/dL (ref 0.40–1.20)
GFR: 84.08 mL/min (ref >60.00)
Glucose, Bld: 101 mg/dL — ABNORMAL HIGH (ref 70–99)
Potassium: 3.8 mEq/L (ref 3.5–5.1)
Sodium: 138 mEq/L (ref 135–145)
Total Bilirubin: 0.5 mg/dL (ref 0.2–1.2)
Total Protein: 6.8 g/dL (ref 6.0–8.3)

## 2019-04-18 NOTE — Telephone Encounter (Signed)
-----   Message from Alvina Chou sent at 04/04/2019 11:08 AM EST ----- Regarding: Lab orders for Thursday, 1.7.21 F/u labs

## 2019-05-05 IMAGING — MR MR ANKLE*R* WO/W CM
6 of 9 series · 24 of 40 positions shown · IV contrast (20 MULTI)
Comparison: Radiographs dated 08/16/2016

CLINICAL DATA: Pain and tenderness and swelling of the right ankle.

EXAM:
MRI OF THE RIGHT ANKLE WITHOUT AND WITH CONTRAST
TECHNIQUE: Multiplanar, multisequence MR imaging of the ankle was performed
before and after the administration of intravenous contrast.
CONTRAST:  20mL MULTIHANCE GADOBENATE DIMEGLUMINE 529 MG/ML IV SOLN

[Series 4: PD fat-sat · axial · 4.0mm · 0.66mm/px · z∈[-40,+110]mm · 4 of 31 slices shown]
[im 1/31]
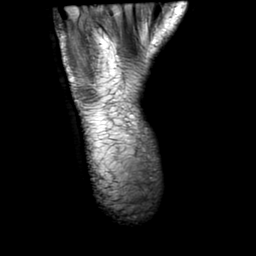
[im 11/31]
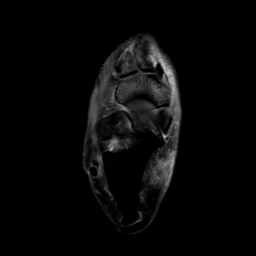
[im 21/31]
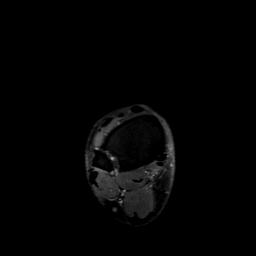
[im 31/31]
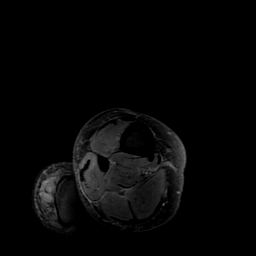

[Series 5: T2 fat-sat · axial · 4.0mm · 0.66mm/px · z∈[-40,+110]mm · 4 of 31 slices shown]
[im 1/31]
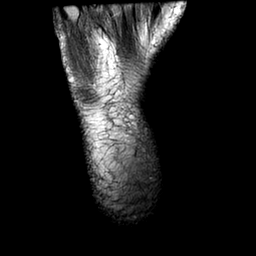
[im 11/31]
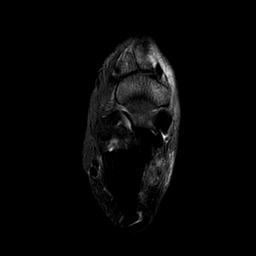
[im 21/31]
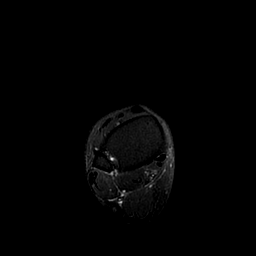
[im 31/31]
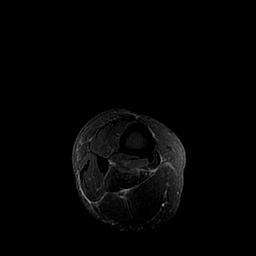

[Series 7: T1 fat-sat · axial · non-contrast · 4.0mm · 0.33mm/px · z∈[-39,+110]mm · 4 of 31 slices shown (1 of 2)]
[im 1/31]
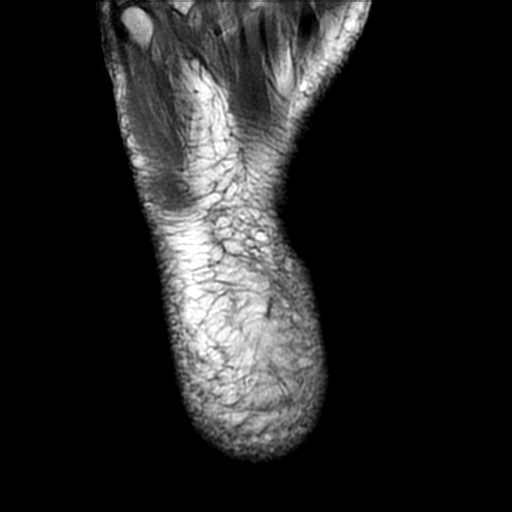
[im 11/31]
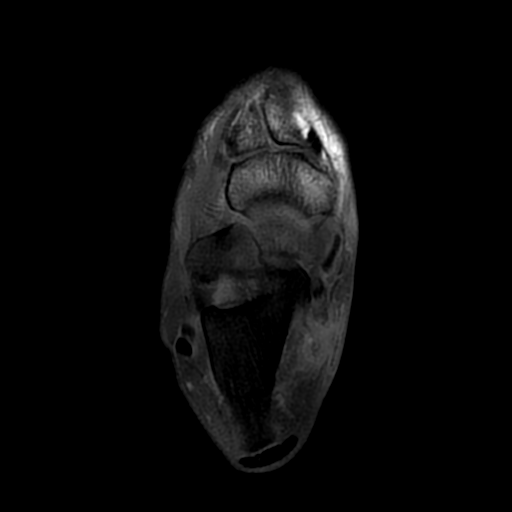
[im 21/31]
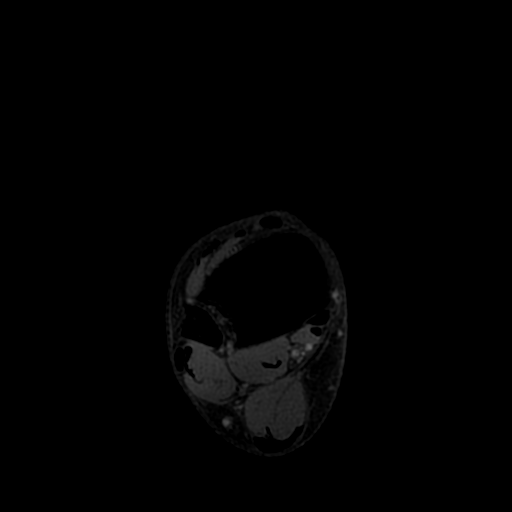
[im 31/31]
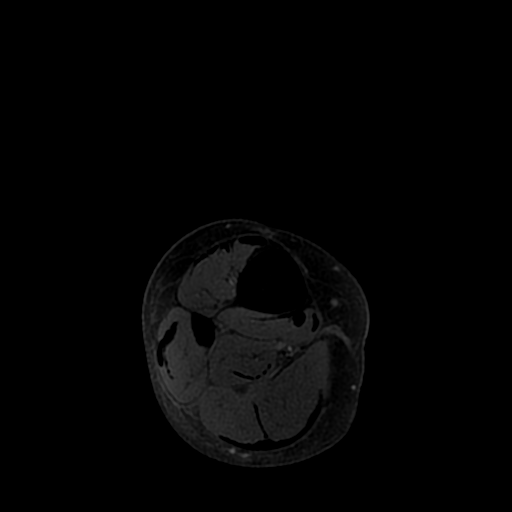

[Series 10: T1 · sagittal · 3.0mm · 0.27mm/px · 4 of 24 slices shown]
[im 1/24]
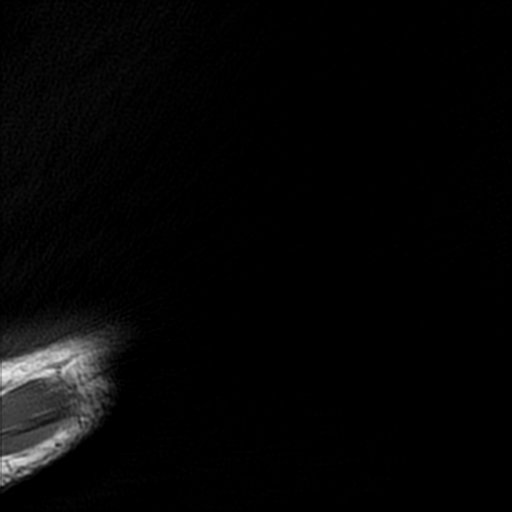
[im 8/24]
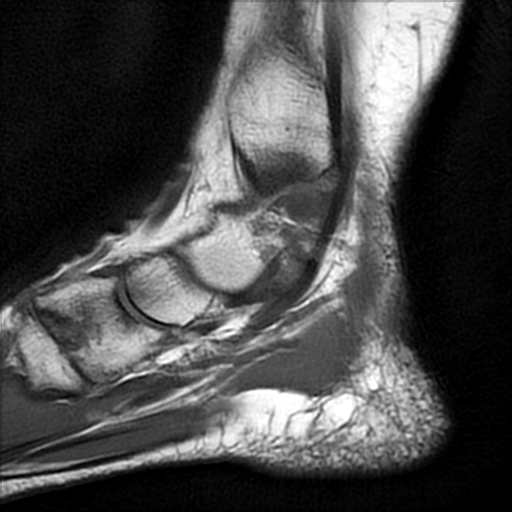
[im 16/24]
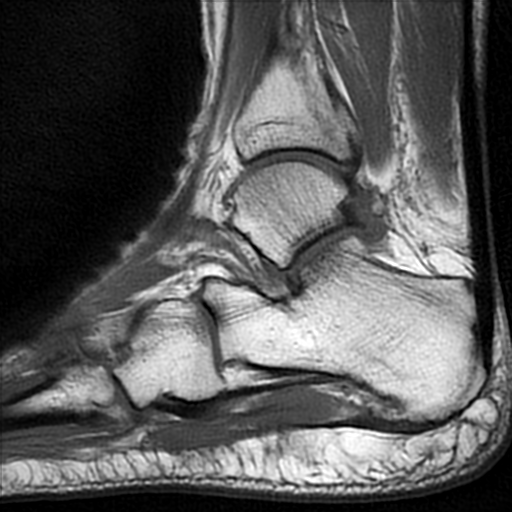
[im 24/24]
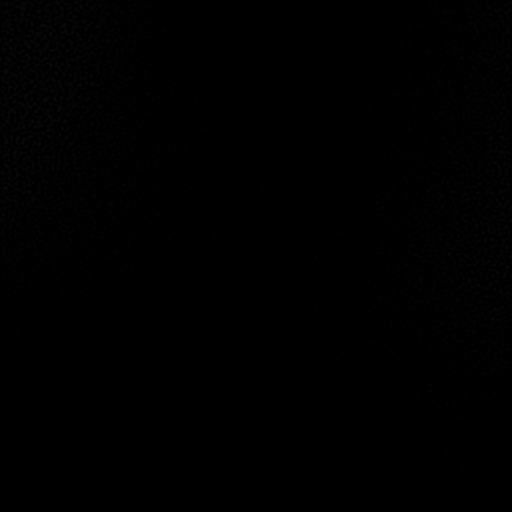

[Series 11: T1 fat-sat post-contrast · axial · 4.0mm · 0.33mm/px · z∈[-39,+110]mm · 5 of 31 slices shown]
[im 1/31]
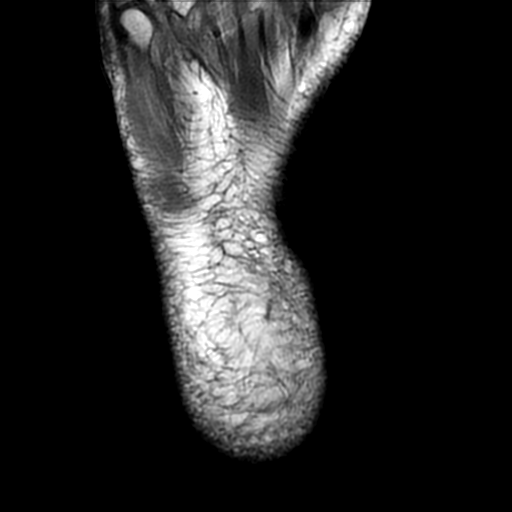
[im 8/31]
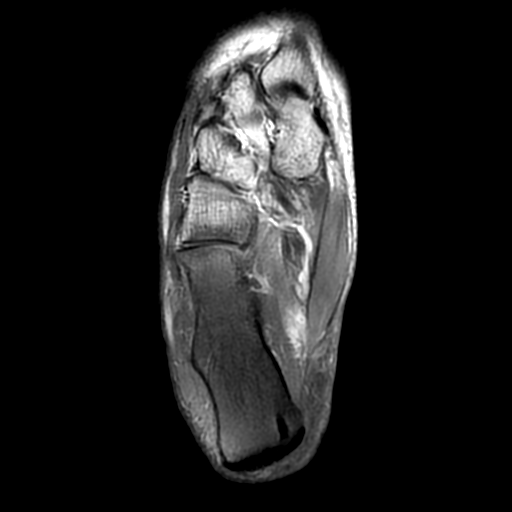
[im 16/31]
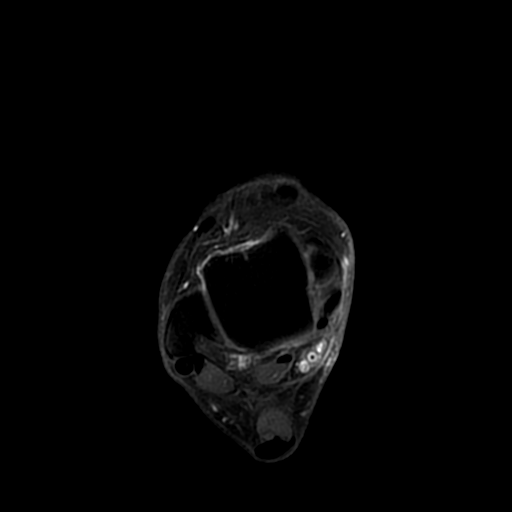
[im 23/31]
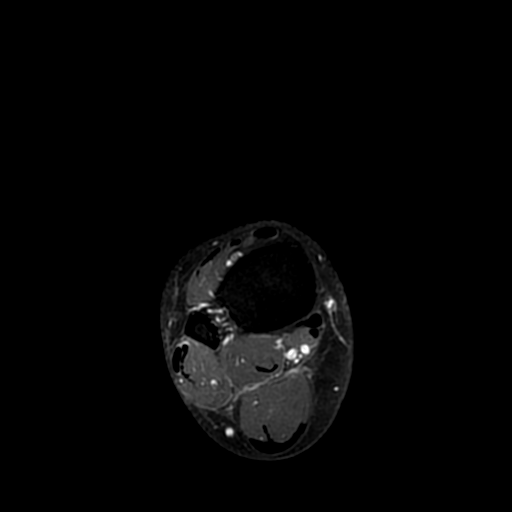
[im 31/31]
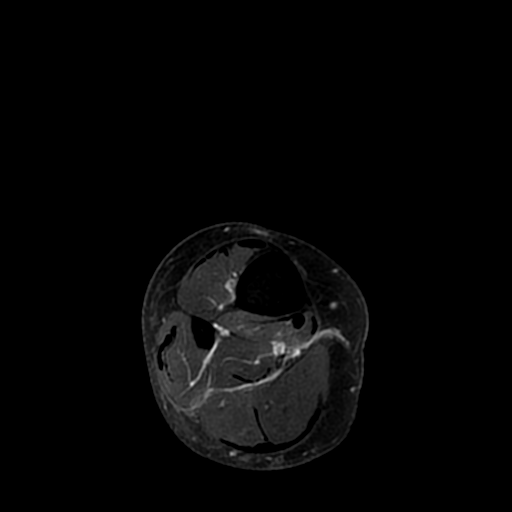

[Series 12: T1 fat-sat · coronal · 3.0mm · 0.27mm/px · 3 of 38 slices shown (2 of 2)]
[im 1/38]
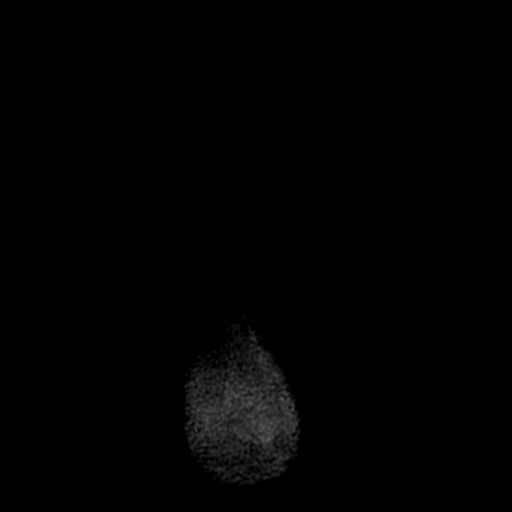
[im 8/38]
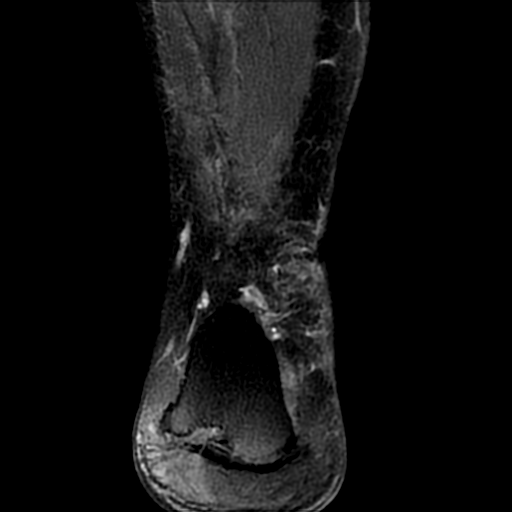
[im 15/38]
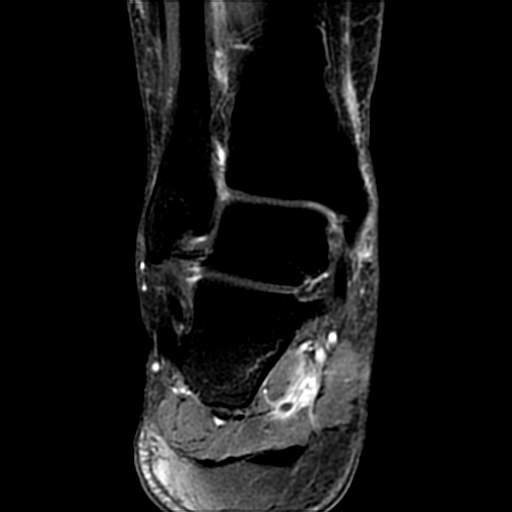

[24 of 40 positions shown; findings below may reference images not displayed]

FINDINGS: Soft tissues: There is inflammation in the soft tissues at the
posteromedial aspect of the ankle extending onto the plantar aspect
of the foot medial to the calcaneus. There are thrombosed veins in
the plantar aspect of the foot best seen on series 13.

TENDONS

Peroneal: Intact peroneus longus and peroneus brevis tendons.

Posteromedial: Small amount of fluid in the sheath of the posterior
tibialis tendon just distal to the tip of the medial malleolus. No
enhancement after contrast administration.

Anterior: Intact tibialis anterior, extensor hallucis longus and
extensor digitorum longus tendons.

Achilles: Normal.

Plantar Fascia: Focal degenerative changes of the origin of the
medial band.

LIGAMENTS

Lateral: Intact.

Medial: Intact.

CARTILAGE

Ankle Joint: No joint effusion or chondral defect.

Subtalar Joints/Sinus Tarsi: No joint effusion or chondral defect.

Bones: Plantar calcaneal spur appear minimal dorsal spurring on the
navicular.
IMPRESSION: IMPRESSION
Thrombophlebitis of the posteromedial aspect of the ankle and foot.

## 2019-06-29 ENCOUNTER — Other Ambulatory Visit: Payer: Self-pay | Admitting: Family Medicine

## 2019-09-01 IMAGING — DX DG FOOT COMPLETE 3+V*L*
3 series · 3 of 3 positions shown · non-contrast
Comparison: None.

CLINICAL DATA: Left foot pain with weight-bearing for 2 months, no
trauma

EXAM:
LEFT FOOT - COMPLETE 3+ VIEW

[foot ap]
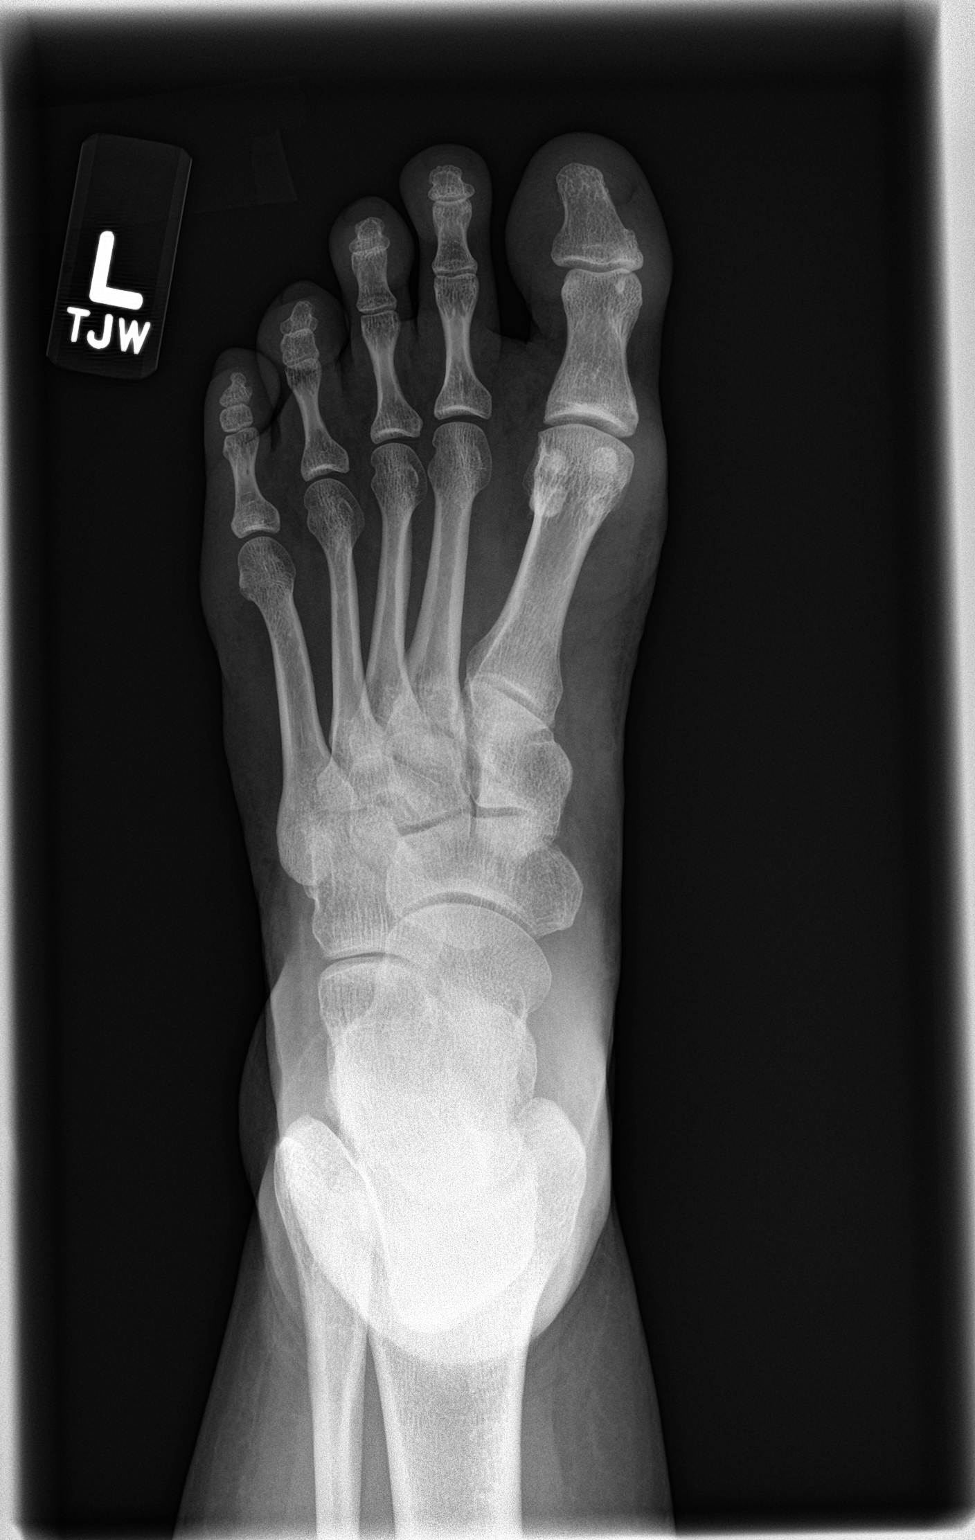

[foot obl]
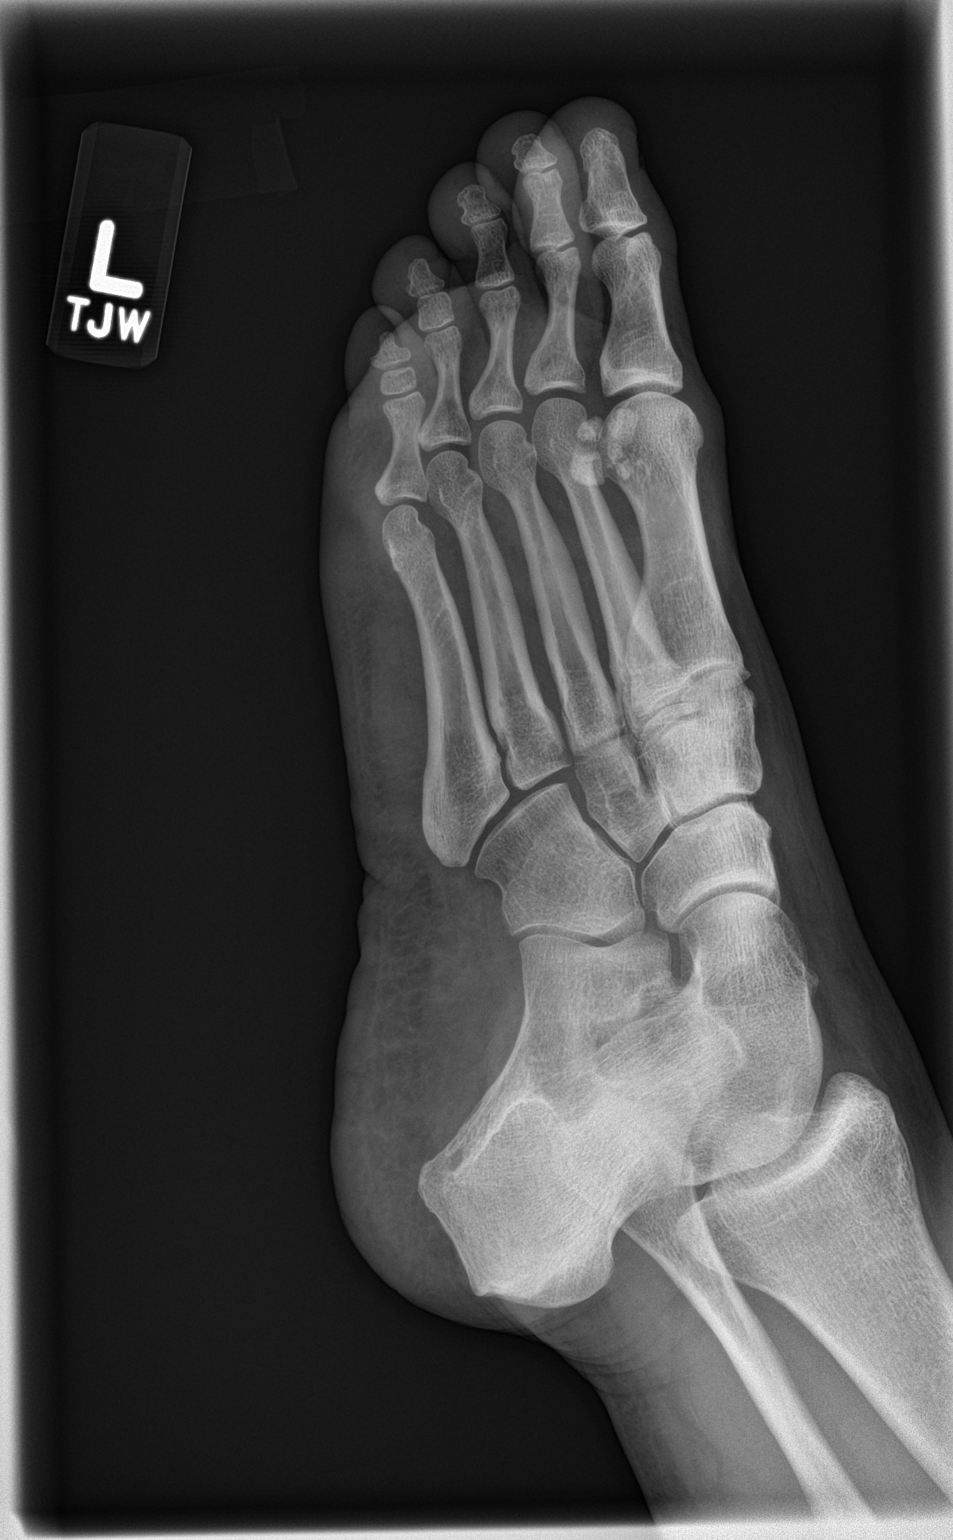

[foot lat]
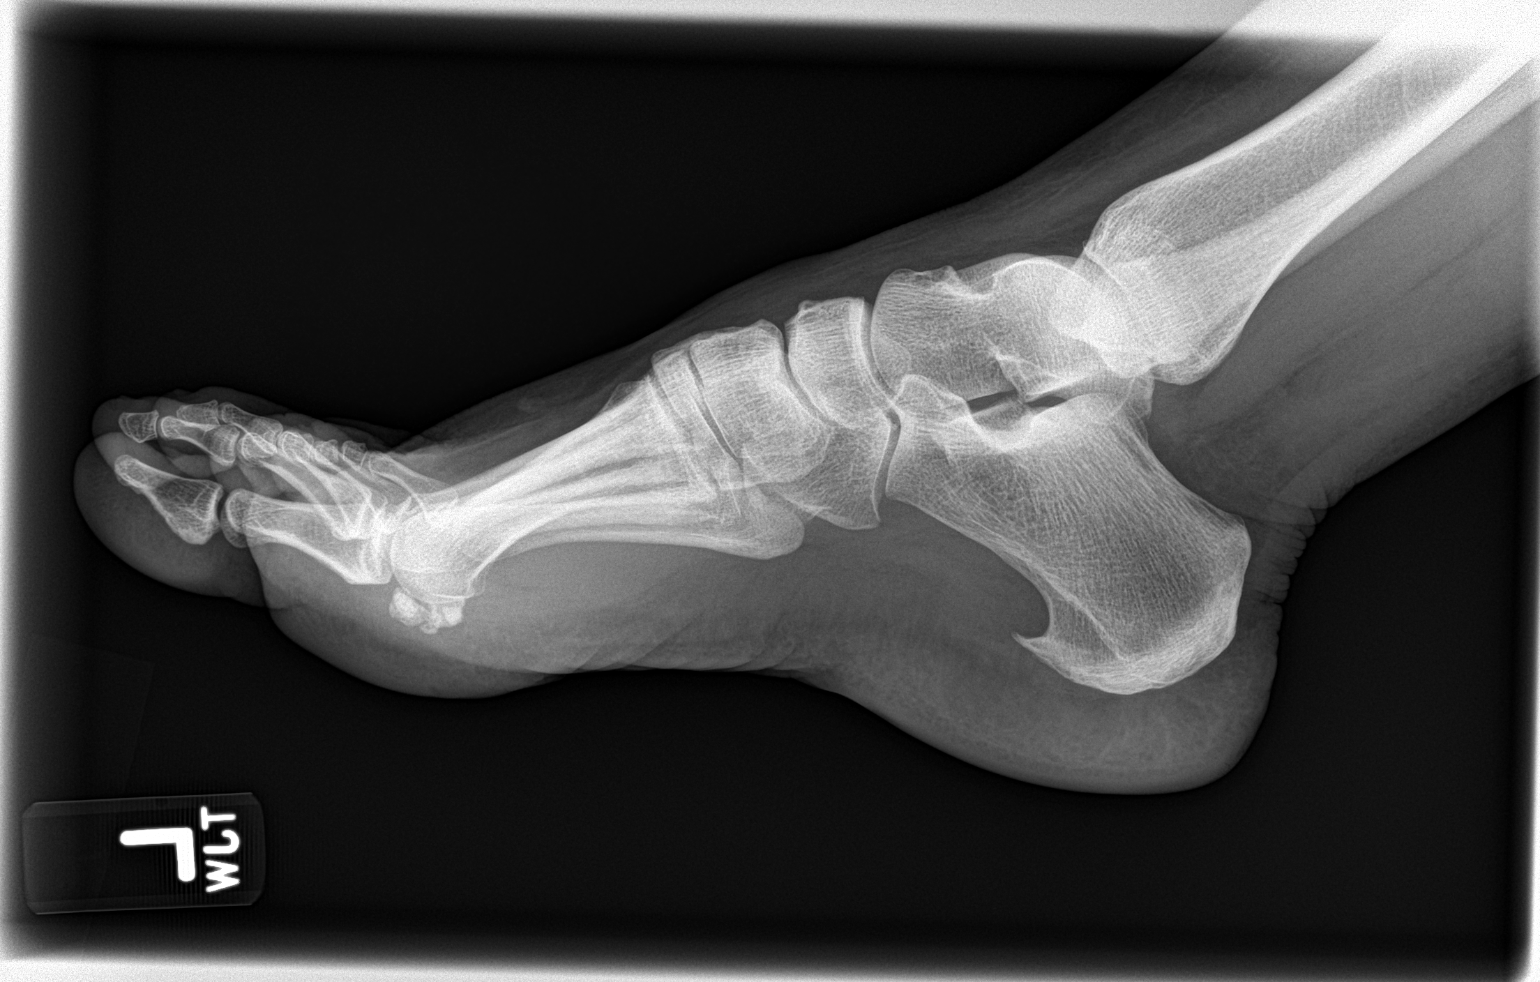

[3 of 3 positions shown; findings below may reference images not displayed]

FINDINGS: Tarsal-metatarsal alignment is normal. There is mild soft tissue
swelling of medial to the left first MTP joint but no significant
degenerative change or erosion is seen. There is a plantar calcaneal
degenerative spur present.
IMPRESSION: 1. No acute abnormality.  No erosion.
2. Degenerative plantar calcaneal spur.

## 2019-09-04 ENCOUNTER — Other Ambulatory Visit: Payer: Self-pay | Admitting: Family Medicine

## 2019-09-18 LAB — HM MAMMOGRAPHY

## 2019-09-30 ENCOUNTER — Other Ambulatory Visit: Payer: Self-pay | Admitting: Gastroenterology

## 2019-12-11 ENCOUNTER — Ambulatory Visit: Payer: BC Managed Care – PPO | Admitting: Gastroenterology

## 2019-12-25 ENCOUNTER — Other Ambulatory Visit: Payer: Self-pay | Admitting: Gastroenterology

## 2019-12-27 ENCOUNTER — Telehealth: Payer: Self-pay | Admitting: Family Medicine

## 2019-12-27 NOTE — Telephone Encounter (Signed)
Labs 11/29 cpx 12/3

## 2019-12-27 NOTE — Telephone Encounter (Signed)
Please schedule CPE with fasting labs prior with Dr. Bedsole.  

## 2020-01-10 ENCOUNTER — Other Ambulatory Visit: Payer: Self-pay

## 2020-01-10 ENCOUNTER — Ambulatory Visit (INDEPENDENT_AMBULATORY_CARE_PROVIDER_SITE_OTHER): Payer: BC Managed Care – PPO | Admitting: Family Medicine

## 2020-01-10 ENCOUNTER — Encounter: Payer: Self-pay | Admitting: Family Medicine

## 2020-01-10 VITALS — BP 110/80 | HR 80 | Temp 98.1°F | Ht 65.0 in | Wt 225.5 lb

## 2020-01-10 DIAGNOSIS — M546 Pain in thoracic spine: Secondary | ICD-10-CM | POA: Diagnosis not present

## 2020-01-10 MED ORDER — CYCLOBENZAPRINE HCL 10 MG PO TABS: 10.0000 mg | ORAL_TABLET | Freq: Every evening | ORAL | 0 refills | Status: DC | PRN

## 2020-01-10 MED ORDER — DICLOFENAC SODIUM 75 MG PO TBEC: 75.0000 mg | DELAYED_RELEASE_TABLET | Freq: Two times a day (BID) | ORAL | 0 refills | Status: DC

## 2020-01-10 NOTE — Progress Notes (Signed)
Chief Complaint  Patient presents with  . Back Pain    Under left  Shoulder Blade x 3 months    History of Present Illness: HPI    49 year old female presents for back pain,  Under left shoulder blade, new. No fall, no new insj   She reports  First noted  under left shoulder blade. Worse at night. No change with moving arms.  No numbness, no arm weakness.   Upper back feels tight.  She saw chiropractor, acupuncture, Tens unit... helped temporarily. She had massages.  Always comes back. Tried new mattress. Has not tried any med to treat.   No neck pain     This visit occurred during the SARS-CoV-2 public health emergency.  Safety protocols were in place, including screening questions prior to the visit, additional usage of staff PPE, and extensive cleaning of exam room while observing appropriate contact time as indicated for disinfecting solutions.   COVID 19 screen:  No recent travel or known exposure to COVID19 The patient denies respiratory symptoms of COVID 19 at this time. The importance of social distancing was discussed today.     Review of Systems  Constitutional: Negative for chills and fever.  HENT: Negative for congestion and ear pain.   Eyes: Negative for pain and redness.  Respiratory: Negative for cough and shortness of breath.   Cardiovascular: Negative for chest pain, palpitations and leg swelling.  Gastrointestinal: Negative for abdominal pain, blood in stool, constipation, diarrhea, nausea and vomiting.  Genitourinary: Negative for dysuria.  Musculoskeletal: Negative for falls and myalgias.  Skin: Negative for rash.  Neurological: Negative for dizziness.  Psychiatric/Behavioral: Negative for depression. The patient is not nervous/anxious.       Past Medical History:  Diagnosis Date  . Allergy   . Chronic headaches    2/WEEK SINUS AND STRESS HEADACHES  . Deviated nasal septum   . GERD (gastroesophageal reflux disease)   . Hyperlipidemia   .  Hypertension    CONTROLLED ON MEDS  . Motion sickness    CAR, [facility]  . Nasal turbinate hypertrophy    NASAL OBSTRUCTION  . Obesity   . Shortness of breath dyspnea    ON EXERTION  . Sinusitis, chronic     reports that she has never smoked. She has never used smokeless tobacco. She reports current alcohol use. She reports that she does not use drugs.   Current Outpatient Medications:  .  famotidine (PEPCID) 20 MG tablet, TAKE 1 TABLET BY MOUTH EVERYDAY AT BEDTIME, Disp: 90 tablet, Rfl: 1 .  ibuprofen (ADVIL,MOTRIN) 200 MG tablet, Take 600 mg by mouth every 6 (six) hours as needed for headache or mild pain. , Disp: , Rfl:  .  lisinopril-hydrochlorothiazide (ZESTORETIC) 20-12.5 MG tablet, TAKE 1 TABLET BY MOUTH EVERY DAY, Disp: 90 tablet, Rfl: 0 .  triamcinolone (NASACORT ALLERGY 24HR) 55 MCG/ACT AERO nasal inhaler, Place 2 sprays into the nose every evening. , Disp: , Rfl:    Observations/Objective: Blood pressure 110/80, pulse 80, temperature 98.1 F (36.7 C), temperature source Temporal, height 5\' 5"  (1.651 m), weight 225 lb 8 oz (102.3 kg), SpO2 98 %.  Physical Exam Constitutional:      General: She is not in acute distress.    Appearance: Normal appearance. She is well-developed. She is not ill-appearing or toxic-appearing.  HENT:     Head: Normocephalic.     Right Ear: Hearing, tympanic membrane, ear canal and external ear normal. Tympanic membrane is not erythematous,  retracted or bulging.     Left Ear: Hearing, tympanic membrane, ear canal and external ear normal. Tympanic membrane is not erythematous, retracted or bulging.     Nose: No mucosal edema or rhinorrhea.     Right Sinus: No maxillary sinus tenderness or frontal sinus tenderness.     Left Sinus: No maxillary sinus tenderness or frontal sinus tenderness.     Mouth/Throat:     Pharynx: Uvula midline.  Eyes:     General: Lids are normal. Lids are everted, no foreign bodies appreciated.     Conjunctiva/sclera:  Conjunctivae normal.     Pupils: Pupils are equal, round, and reactive to light.  Neck:     Thyroid: No thyroid mass or thyromegaly.     Vascular: No carotid bruit.     Trachea: Trachea normal.  Cardiovascular:     Rate and Rhythm: Normal rate and regular rhythm.     Pulses: Normal pulses.     Heart sounds: Normal heart sounds, S1 normal and S2 normal. No murmur heard.  No friction rub. No gallop.   Pulmonary:     Effort: Pulmonary effort is normal. No tachypnea or respiratory distress.     Breath sounds: Normal breath sounds. No decreased breath sounds, wheezing, rhonchi or rales.  Abdominal:     General: Bowel sounds are normal.     Palpations: Abdomen is soft.     Tenderness: There is no abdominal tenderness.  Musculoskeletal:     Cervical back: Neck supple. Tenderness present. No bony tenderness. Pain with movement present. Decreased range of motion.     Thoracic back: Tenderness present.     Lumbar back: Normal.  Skin:    General: Skin is warm and dry.     Findings: No rash.  Neurological:     Mental Status: She is alert.  Psychiatric:        Mood and Affect: Mood is not anxious or depressed.        Speech: Speech normal.        Behavior: Behavior normal. Behavior is cooperative.        Thought Content: Thought content normal.        Judgment: Judgment normal.      Assessment and Plan   Acute thoracic back pain Stop ibuprofen.  Start diclofenac twice daily.  Can use muscle relaxant at night.  Keep doing heat and stretches.      Kerby Nora, MD

## 2020-01-10 NOTE — Patient Instructions (Signed)
Stop ibuprofen.  Start diclofenac twice daily.  Can use muscle relaxant at night.  Keep doing heat and stretches.

## 2020-02-14 ENCOUNTER — Other Ambulatory Visit: Payer: Self-pay

## 2020-02-14 ENCOUNTER — Encounter: Payer: Self-pay | Admitting: Family Medicine

## 2020-02-14 ENCOUNTER — Ambulatory Visit: Payer: BC Managed Care – PPO | Admitting: Family Medicine

## 2020-02-14 VITALS — BP 134/88 | HR 93 | Temp 97.7°F | Ht 65.0 in | Wt 226.8 lb

## 2020-02-14 DIAGNOSIS — M545 Low back pain, unspecified: Secondary | ICD-10-CM | POA: Diagnosis not present

## 2020-02-14 DIAGNOSIS — R1031 Right lower quadrant pain: Secondary | ICD-10-CM | POA: Insufficient documentation

## 2020-02-14 LAB — POCT URINALYSIS DIPSTICK
Bilirubin, UA: NEGATIVE
Blood, UA: NEGATIVE
Glucose, UA: NEGATIVE
Ketones, UA: NEGATIVE
Leukocytes, UA: NEGATIVE
Nitrite, UA: NEGATIVE
Protein, UA: NEGATIVE
Spec Grav, UA: >=1.03 — AB (ref 1.010–1.025)
Urobilinogen, UA: 0.2 E.U./dL
pH, UA: 6 (ref 5.0–8.0)

## 2020-02-14 NOTE — Assessment & Plan Note (Addendum)
?   If referred from low back... no sign of rash such as shingles.  Offered [place] RLQ/transvaginal... not interested unless not improving.. no red flags for infection or appendicitis.

## 2020-02-14 NOTE — Patient Instructions (Signed)
Push fluids.   Call if any rash appears.  Start gentle low back stretching and heat on low. Call if not improving.

## 2020-02-14 NOTE — Progress Notes (Signed)
Chief Complaint  Patient presents with   Back Pain    Pt c/o low back pain. Pt thinks it's for a UTI    History of Present Illness: Back Pain This is a new problem. The current episode started 1 to 4 weeks ago (10 days). The problem has been waxing and waning since onset. The pain is present in the lumbar spine (occ pain in RLQ). The quality of the pain is described as stabbing. Radiates to: radiates to RLQ at times. The pain is at a severity of 4/10. The pain is mild. The pain is the same all the time. Associated symptoms include abdominal pain, leg pain and pelvic pain. Pertinent negatives include no bladder incontinence, bowel incontinence, dysuria, fever, numbness, tingling or weakness. (One episode of chills   right knee posterior sore... intermittent.)     Upper back pain has improved in last month.. with diclofenac.   This visit occurred during the SARS-CoV-2 public health emergency.  Safety protocols were in place, including screening questions prior to the visit, additional usage of staff PPE, and extensive cleaning of exam room while observing appropriate contact time as indicated for disinfecting solutions.   COVID 19 screen:  No recent travel or known exposure to COVID19 The patient denies respiratory symptoms of COVID 19 at this time. The importance of social distancing was discussed today.     Review of Systems  Constitutional: Negative for fever.  Gastrointestinal: Positive for abdominal pain. Negative for bowel incontinence.  Genitourinary: Positive for pelvic pain. Negative for bladder incontinence and dysuria.  Musculoskeletal: Positive for back pain.  Neurological: Negative for tingling, weakness and numbness.      Past Medical History:  Diagnosis Date   Allergy    Chronic headaches    2/WEEK SINUS AND STRESS HEADACHES   Deviated nasal septum    GERD (gastroesophageal reflux disease)    Hyperlipidemia    Hypertension    CONTROLLED ON MEDS    Motion sickness    CAR, [facility]   Nasal turbinate hypertrophy    NASAL OBSTRUCTION   Obesity    Shortness of breath dyspnea    ON EXERTION   Sinusitis, chronic     reports that she has never smoked. She has never used smokeless tobacco. She reports current alcohol use. She reports that she does not use drugs.   Current Outpatient Medications:    cyclobenzaprine (FLEXERIL) 10 MG tablet, Take 1 tablet (10 mg total) by mouth at bedtime as needed for muscle spasms., Disp: 15 tablet, Rfl: 0   diclofenac (VOLTAREN) 75 MG EC tablet, Take 1 tablet (75 mg total) by mouth 2 (two) times daily., Disp: 30 tablet, Rfl: 0   famotidine (PEPCID) 20 MG tablet, TAKE 1 TABLET BY MOUTH EVERYDAY AT BEDTIME, Disp: 90 tablet, Rfl: 1   ibuprofen (ADVIL,MOTRIN) 200 MG tablet, Take 600 mg by mouth every 6 (six) hours as needed for headache or mild pain. , Disp: , Rfl:    lisinopril-hydrochlorothiazide (ZESTORETIC) 20-12.5 MG tablet, TAKE 1 TABLET BY MOUTH EVERY DAY, Disp: 90 tablet, Rfl: 0   triamcinolone (NASACORT ALLERGY 24HR) 55 MCG/ACT AERO nasal inhaler, Place 2 sprays into the nose every evening. , Disp: , Rfl:    Observations/Objective: Blood pressure 134/88, pulse 93, temperature 97.7 F (36.5 C), height 5\' 5"  (1.651 m), weight 226 lb 12 oz (102.9 kg), SpO2 96 %.  Physical Exam Constitutional:      General: She is not in acute distress.    Appearance:  Normal appearance. She is well-developed. She is not ill-appearing or toxic-appearing.  HENT:     Head: Normocephalic.     Right Ear: Hearing, tympanic membrane, ear canal and external ear normal. Tympanic membrane is not erythematous, retracted or bulging.     Left Ear: Hearing, tympanic membrane, ear canal and external ear normal. Tympanic membrane is not erythematous, retracted or bulging.     Nose: No mucosal edema or rhinorrhea.     Right Sinus: No maxillary sinus tenderness or frontal sinus tenderness.     Left Sinus: No maxillary  sinus tenderness or frontal sinus tenderness.     Mouth/Throat:     Pharynx: Uvula midline.  Eyes:     General: Lids are normal. Lids are everted, no foreign bodies appreciated.     Conjunctiva/sclera: Conjunctivae normal.     Pupils: Pupils are equal, round, and reactive to light.  Neck:     Thyroid: No thyroid mass or thyromegaly.     Vascular: No carotid bruit.     Trachea: Trachea normal.  Cardiovascular:     Rate and Rhythm: Normal rate and regular rhythm.     Pulses: Normal pulses.     Heart sounds: Normal heart sounds, S1 normal and S2 normal. No murmur heard.  No friction rub. No gallop.   Pulmonary:     Effort: Pulmonary effort is normal. No tachypnea or respiratory distress.     Breath sounds: Normal breath sounds. No decreased breath sounds, wheezing, rhonchi or rales.  Abdominal:     General: Bowel sounds are normal.     Palpations: Abdomen is soft.     Tenderness: There is abdominal tenderness in the right lower quadrant. There is no right CVA tenderness or left CVA tenderness. Negative signs include Murphy's sign and McBurney's sign.  Musculoskeletal:     Cervical back: Normal range of motion and neck supple.     Lumbar back: Tenderness present. Normal range of motion. Negative right straight leg raise test and negative left straight leg raise test.       Back:  Skin:    General: Skin is warm and dry.     Findings: No rash.  Neurological:     Mental Status: She is alert.  Psychiatric:        Mood and Affect: Mood is not anxious or depressed.        Speech: Speech normal.        Behavior: Behavior normal. Behavior is cooperative.        Thought Content: Thought content normal.        Judgment: Judgment normal.      Assessment and Plan Acute right-sided low back pain without sciatica No suggestion of kidney stone or UTI in UA.   No rash consistent with shingles.   Most likely MSK strain.Marland Kitchen treat with heat and gentle stretching.  Call if not improving for  further eval.  Right lower quadrant pain ? If referred from low back... no sign of rash such as shingles.  Offered [place] RLQ/transvaginal... not interested unless not improving.. no red flags for infection or appendicitis.       Kerby Nora, MD

## 2020-02-14 NOTE — Assessment & Plan Note (Signed)
No suggestion of kidney stone or UTI in UA.   No rash consistent with shingles.   Most likely MSK strain.Marland Kitchen treat with heat and gentle stretching.  Call if not improving for further eval.

## 2020-02-17 DIAGNOSIS — M546 Pain in thoracic spine: Secondary | ICD-10-CM | POA: Insufficient documentation

## 2020-02-17 NOTE — Assessment & Plan Note (Signed)
Stop ibuprofen.  Start diclofenac twice daily.  Can use muscle relaxant at night.  Keep doing heat and stretches.  

## 2020-03-09 ENCOUNTER — Other Ambulatory Visit: Payer: Self-pay

## 2020-03-09 ENCOUNTER — Telehealth: Payer: Self-pay | Admitting: Family Medicine

## 2020-03-09 ENCOUNTER — Other Ambulatory Visit (INDEPENDENT_AMBULATORY_CARE_PROVIDER_SITE_OTHER): Payer: BC Managed Care – PPO

## 2020-03-09 DIAGNOSIS — R7303 Prediabetes: Secondary | ICD-10-CM

## 2020-03-09 DIAGNOSIS — E7800 Pure hypercholesterolemia, unspecified: Secondary | ICD-10-CM

## 2020-03-09 LAB — CBC WITH DIFFERENTIAL/PLATELET
Basophils Absolute: 0.1 10*3/uL (ref 0.0–0.1)
Basophils Relative: 0.9 % (ref 0.0–3.0)
Eosinophils Absolute: 0.1 10*3/uL (ref 0.0–0.7)
Eosinophils Relative: 1.7 % (ref 0.0–5.0)
HCT: 43.3 % (ref 36.0–46.0)
Hemoglobin: 14.7 g/dL (ref 12.0–15.0)
Lymphocytes Relative: 31.5 % (ref 12.0–46.0)
Lymphs Abs: 2.4 10*3/uL (ref 0.7–4.0)
MCHC: 33.9 g/dL (ref 30.0–36.0)
MCV: 93.8 fl (ref 78.0–100.0)
Monocytes Absolute: 0.5 10*3/uL (ref 0.1–1.0)
Monocytes Relative: 6.9 % (ref 3.0–12.0)
Neutro Abs: 4.6 10*3/uL (ref 1.4–7.7)
Neutrophils Relative %: 59 % (ref 43.0–77.0)
Platelets: 233 10*3/uL (ref 150.0–400.0)
RBC: 4.61 Mil/uL (ref 3.87–5.11)
RDW: 13.2 % (ref 11.5–15.5)
WBC: 7.8 10*3/uL (ref 4.0–10.5)

## 2020-03-09 LAB — COMPREHENSIVE METABOLIC PANEL
ALT: 15 U/L (ref 0–35)
AST: 13 U/L (ref 0–37)
Albumin: 4.2 g/dL (ref 3.5–5.2)
Alkaline Phosphatase: 54 U/L (ref 39–117)
BUN: 15 mg/dL (ref 6–23)
CO2: 30 mEq/L (ref 19–32)
Calcium: 9.4 mg/dL (ref 8.4–10.5)
Chloride: 100 mEq/L (ref 96–112)
Creatinine, Ser: 0.72 mg/dL (ref 0.40–1.20)
GFR: 96.55 mL/min (ref >60.00)
Glucose, Bld: 95 mg/dL (ref 70–99)
Potassium: 4.2 mEq/L (ref 3.5–5.1)
Sodium: 137 mEq/L (ref 135–145)
Total Bilirubin: 0.4 mg/dL (ref 0.2–1.2)
Total Protein: 7.1 g/dL (ref 6.0–8.3)

## 2020-03-09 LAB — LIPID PANEL
Cholesterol: 220 mg/dL — ABNORMAL HIGH (ref 0–200)
HDL: 57.9 mg/dL (ref >39.00)
LDL Cholesterol: 140 mg/dL — ABNORMAL HIGH (ref 0–99)
NonHDL: 162.32
Total CHOL/HDL Ratio: 4
Triglycerides: 111 mg/dL (ref 0.0–149.0)
VLDL: 22.2 mg/dL (ref 0.0–40.0)

## 2020-03-09 LAB — HEMOGLOBIN A1C: Hgb A1c MFr Bld: 5.7 % (ref 4.6–6.5)

## 2020-03-09 NOTE — Telephone Encounter (Signed)
-----   Message from Aquilla Solian, RT sent at 02/25/2020 10:41 AM EST ----- Regarding: Lab Orders for Monday 11.29.2021 Please place lab orders for Monday 11.29.2021, office visit for physical on Friday 12.3.2021 Thank you, Jones Bales RT(R)

## 2020-03-09 NOTE — Progress Notes (Signed)
No critical labs need to be addressed urgently. We will discuss labs in detail at upcoming office visit.   

## 2020-03-13 ENCOUNTER — Other Ambulatory Visit: Payer: Self-pay

## 2020-03-13 ENCOUNTER — Encounter: Payer: Self-pay | Admitting: Family Medicine

## 2020-03-13 ENCOUNTER — Ambulatory Visit (INDEPENDENT_AMBULATORY_CARE_PROVIDER_SITE_OTHER): Payer: BC Managed Care – PPO | Admitting: Family Medicine

## 2020-03-13 VITALS — BP 120/90 | HR 84 | Temp 97.8°F | Ht 65.5 in | Wt 225.2 lb

## 2020-03-13 DIAGNOSIS — I10 Essential (primary) hypertension: Secondary | ICD-10-CM | POA: Diagnosis not present

## 2020-03-13 DIAGNOSIS — Z0000 Encounter for general adult medical examination without abnormal findings: Secondary | ICD-10-CM

## 2020-03-13 DIAGNOSIS — R7303 Prediabetes: Secondary | ICD-10-CM

## 2020-03-13 DIAGNOSIS — E7800 Pure hypercholesterolemia, unspecified: Secondary | ICD-10-CM

## 2020-03-13 DIAGNOSIS — Z23 Encounter for immunization: Secondary | ICD-10-CM | POA: Diagnosis not present

## 2020-03-13 DIAGNOSIS — K219 Gastro-esophageal reflux disease without esophagitis: Secondary | ICD-10-CM | POA: Diagnosis not present

## 2020-03-13 NOTE — Patient Instructions (Signed)
Here is some info I have gathered for you for a trusted medical source. Lipid Management With Diet, Uptodate Feb 20.2021, [place] and [place]  Although earlier, smaller trials suggested a benefit of garlic supplementation, a subsequent larger trial failed to demonstrate improvement in lipids with use of any of three different garlic preparations (raw, powdered, or aged).  Bergamot: Improvements in serum lipids have been reported in trials of patients with metabolic syndrome, nonalcoholic fatty liver disease and in hyperlipidemic patients resistant to statin treatment. However, high-quality data on the effects of bergamot are lacking.  Suggestions for you if you would like to try natural supplements to lower cholesterol.  1.Souble fiber : Psyllium In a meta-analysis of randomized trials of patients with both normal and elevated cholesterol levels, the addition of 10.2 g/day of psyllium lowered the LDL cholesterol by an average of 12.8 mg/dL   2. Omega 3s: Mixed results in studies. Given you triglycerides are normal I would not use this.  3.Red yeast rice ( 2.4 grams divided half in AM half in PM): Red yeast rice is a fermented rice product, most often taken as a supplement, which can improve serum cholesterol  via  method similar to prescription statins.  Red yeast rice supplements lowered total cholesterol (208 versus 251 mg/dL) and LDL cholesterol (135 versus 175 mg/dL) compared with placebo.  4. Plant sterol.. There are naturally occurring sterols and stanols in nuts, legumes, whole grains, fruits, vegetables, and plant oils. In addition, a number of manufactured products enriched with plant sterols and stanols are commercially available. The margarines containing these compounds (eg, Benecol and Take Control spreads) have been available the longest and are the most studied  In a trial of 150 patients with mild hypercholesterolemia,those consuming the fortified margarine experienced a 10 to 14  percent decrease in total cholesterol and LDL cholesterol.  5.Green Tea Catechins: .n a year-long randomized trial of more than 900 healthy postmenopausal women, green tea catechin supplements (1315 mg catechins/day) reduced total cholesterol and LDL cholesterol, increased triglycerides, and had no effect on HDL cholesterol   

## 2020-03-13 NOTE — Progress Notes (Signed)
Chief Complaint  Patient presents with  . Annual Exam    History of Present Illness: HPI  The patient is here for annual wellness exam and preventative care.    2 daughters with COVID 3 weeks ago... pt's test was negative.  Hypertension:    Stable control on lisinopril HCTZ 20/12.5 mg daily, occ forgets to take BP meds. BP Readings from Last 3 Encounters:  03/13/20 120/90  02/14/20 134/88  01/10/20 110/80  Using medication without problems or lightheadedness: none Chest pain with exertion: none Edema:none Short of breath:none Average home BPs: 120/70s... was high when she forgot to take it. Other issues:  Elevated Cholesterol:  At goal with lifestyle, no indication for statin. Lab Results  Component Value Date   CHOL 220 (H) 03/09/2020   HDL 57.90 03/09/2020   LDLCALC 140 (H) 03/09/2020   LDLDIRECT 146.6 05/06/2013   TRIG 111.0 03/09/2020   CHOLHDL 4 03/09/2020  Using medications without problems: Muscle aches:  Diet compliance: eating habits improved.. using stevia Exercise: walking  Other complaints: The 10-year ASCVD risk score Denman George DC Montez Hageman., et al., 2013) is: 1.7%   Values used to calculate the score:     Age: 49 years     Sex: Female     Is Non-Hispanic African American: No     Diabetic: No     Tobacco smoker: No     Systolic Blood Pressure: 120 mmHg     Is BP treated: Yes     HDL Cholesterol: 57.9 mg/dL     Total Cholesterol: 220 mg/dL    prediabetes  Lab Results  Component Value Date   HGBA1C 5.7 03/09/2020   Body mass index is 36.91 kg/m. Wt Readings from Last 3 Encounters:  03/13/20 225 lb 4 oz (102.2 kg)  02/14/20 226 lb 12 oz (102.9 kg)  01/10/20 225 lb 8 oz (102.3 kg)      This visit occurred during the SARS-CoV-2 public health emergency.  Safety protocols were in place, including screening questions prior to the visit, additional usage of staff PPE, and extensive cleaning of exam room while observing appropriate contact time as indicated  for disinfecting solutions.   COVID 19 screen:  No recent travel or known exposure to COVID19 The patient denies respiratory symptoms of COVID 19 at this time. The importance of social distancing was discussed today.     Review of Systems  Constitutional: Negative for chills and fever.  HENT: Negative for congestion and ear pain.   Eyes: Negative for pain and redness.  Respiratory: Negative for cough and shortness of breath.   Cardiovascular: Negative for chest pain, palpitations and leg swelling.  Gastrointestinal: Negative for abdominal pain, blood in stool, constipation, diarrhea, nausea and vomiting.  Genitourinary: Negative for dysuria.  Musculoskeletal: Negative for falls and myalgias.  Skin: Negative for rash.  Neurological: Negative for dizziness.  Psychiatric/Behavioral: Negative for depression. The patient is not nervous/anxious.       Past Medical History:  Diagnosis Date  . Allergy   . Chronic headaches    2/WEEK SINUS AND STRESS HEADACHES  . Deviated nasal septum   . GERD (gastroesophageal reflux disease)   . Hyperlipidemia   . Hypertension    CONTROLLED ON MEDS  . Motion sickness    [facility], [facility]  . Nasal turbinate hypertrophy    NASAL OBSTRUCTION  . Obesity   . Shortness of breath dyspnea    ON EXERTION  . Sinusitis, chronic     reports that she  has never smoked. She has never used smokeless tobacco. She reports current alcohol use. She reports that she does not use drugs.   Current Outpatient Medications:  .  cyclobenzaprine (FLEXERIL) 10 MG tablet, Take 1 tablet (10 mg total) by mouth at bedtime as needed for muscle spasms., Disp: 15 tablet, Rfl: 0 .  famotidine (PEPCID) 20 MG tablet, TAKE 1 TABLET BY MOUTH EVERYDAY AT BEDTIME, Disp: 90 tablet, Rfl: 1 .  ibuprofen (ADVIL,MOTRIN) 200 MG tablet, Take 600 mg by mouth every 6 (six) hours as needed for headache or mild pain. , Disp: , Rfl:  .  lisinopril-hydrochlorothiazide (ZESTORETIC) 20-12.5 MG  tablet, TAKE 1 TABLET BY MOUTH EVERY DAY, Disp: 90 tablet, Rfl: 0 .  triamcinolone (NASACORT ALLERGY 24HR) 55 MCG/ACT AERO nasal inhaler, Place 2 sprays into the nose every evening. , Disp: , Rfl:    Observations/Objective: Blood pressure 120/90, pulse 84, temperature 97.8 F (36.6 C), temperature source Temporal, height 5' 5.5" (1.664 m), weight 225 lb 4 oz (102.2 kg), SpO2 97 %.  Physical Exam Constitutional:      General: She is not in acute distress.    Appearance: Normal appearance. She is well-developed. She is not ill-appearing or toxic-appearing.  HENT:     Head: Normocephalic.     Right Ear: Hearing, tympanic membrane, ear canal and external ear normal.     Left Ear: Hearing, tympanic membrane, ear canal and external ear normal.     Nose: Nose normal.  Eyes:     General: Lids are normal. Lids are everted, no foreign bodies appreciated.     Conjunctiva/sclera: Conjunctivae normal.     Pupils: Pupils are equal, round, and reactive to light.  Neck:     Thyroid: No thyroid mass or thyromegaly.     Vascular: No carotid bruit.     Trachea: Trachea normal.  Cardiovascular:     Rate and Rhythm: Normal rate and regular rhythm.     Heart sounds: Normal heart sounds, S1 normal and S2 normal. No murmur heard.  No gallop.   Pulmonary:     Effort: Pulmonary effort is normal. No respiratory distress.     Breath sounds: Normal breath sounds. No wheezing, rhonchi or rales.  Abdominal:     General: Bowel sounds are normal. There is no distension or abdominal bruit.     Palpations: Abdomen is soft. There is no fluid wave or mass.     Tenderness: There is no abdominal tenderness. There is no guarding or rebound.     Hernia: No hernia is present.  Musculoskeletal:     Cervical back: Normal range of motion and neck supple.  Lymphadenopathy:     Cervical: No cervical adenopathy.  Skin:    General: Skin is warm and dry.     Findings: No rash.  Neurological:     Mental Status: She is  alert.     Cranial Nerves: No cranial nerve deficit.     Sensory: No sensory deficit.  Psychiatric:        Mood and Affect: Mood is not anxious or depressed.        Speech: Speech normal.        Behavior: Behavior normal. Behavior is cooperative.        Judgment: Judgment normal.      Assessment and Plan   The patient's preventative maintenance and recommended screening tests for an annual wellness exam were reviewed in full today. Brought up to date unless services declined.  Counselled  on the importance of diet, exercise, and its role in overall health and mortality. The patient's FH and SH was reviewed, including their home life, tobacco status, and drug and alcohol status.    GYN Dr. Marcelle Overlie  Vaccines:Tdap due , refused flu  Discussed COVID19 vaccine side effects and benefits. Strongly encouraged the patient to get the vaccine. Questions answered. Pap/DVE:  2019 pap  GYN nml repeat 5 years Mammo:  Neg 08/2018... done at GYN office Colon:  2016 neg.. repeat in 10 years. Dr. Larae Grooms Smoking Status:none ETOH/ drug EVO:JJKK/XFGH HIV screen: declined  HTN (hypertension) Stable control on lisinopril HCTZ 20/12.5 mg daily  High cholesterol Encouraged exercise, weight loss, healthy eating habits.   GERD  Stable control on famotidine.  Prediabetes Stable control with diet changes.    Kerby Nora, MD

## 2020-03-13 NOTE — Addendum Note (Signed)
Addended by: Damita Lack on: 03/13/2020 09:12 AM   Modules accepted: Orders

## 2020-03-13 NOTE — Assessment & Plan Note (Signed)
Stable control on lisinopril HCTZ 20/12.5 mg daily

## 2020-03-13 NOTE — Assessment & Plan Note (Signed)
Stable control with diet changes 

## 2020-03-13 NOTE — Assessment & Plan Note (Signed)
Stable control on famotidine.

## 2020-03-13 NOTE — Assessment & Plan Note (Signed)
Encouraged exercise, weight loss, healthy eating habits. ? ?

## 2020-03-29 ENCOUNTER — Other Ambulatory Visit: Payer: Self-pay | Admitting: Family Medicine

## 2020-04-14 ENCOUNTER — Other Ambulatory Visit: Payer: Self-pay | Admitting: Family Medicine

## 2020-05-01 ENCOUNTER — Other Ambulatory Visit: Payer: Self-pay

## 2020-05-01 ENCOUNTER — Ambulatory Visit: Payer: BC Managed Care – PPO | Admitting: Family Medicine

## 2020-05-01 VITALS — BP 118/80 | HR 86 | Temp 97.4°F | Ht 65.5 in | Wt 233.0 lb

## 2020-05-01 DIAGNOSIS — M545 Low back pain, unspecified: Secondary | ICD-10-CM

## 2020-05-01 DIAGNOSIS — M546 Pain in thoracic spine: Secondary | ICD-10-CM | POA: Diagnosis not present

## 2020-05-01 NOTE — Patient Instructions (Signed)
Start PT for shoulder and thoracic spine.. if not improving call.  Continue diclofenac twice daily.

## 2020-05-01 NOTE — Progress Notes (Signed)
Patient ID: Jane Li, female    DOB: 1970-08-16, 50 y.o.   MRN: [id number]  This visit was conducted in person.  BP 118/80   Pulse 86   Temp (!) 97.4 F (36.3 C) (Temporal)   Ht 5' 5.5" (1.664 m)   Wt 233 lb (105.7 kg)   SpO2 97%   BMI 38.18 kg/m    CC:  Chief Complaint  Patient presents with  . Follow-up    Back pain     Subjective:   HPI: JADELIN Li is a 50 y.o. female presenting on 05/01/2020 for Follow-up (Back pain )  She reports she is having continued low back pain, right sided.   Reviewed note when patient seen in 01/10/2020 for upper back pain, under left shoulder blade.  She reports pain started in August.  She saw chiropractor, acupuncture, Tens unit... helped temporarily. She had massages. Tried new mattress.  No neck pain  No numbness in arms, no weaknes, increased pain with deep breath.  Are is itchy as well.   She was treated with diclofenac BID.Marland Kitchen helps some but not resolving. Today she reports the pain has continued... now ongoing x 5 months.  Keeps her up  at times at night.   Saw Ortho 03/2020: Dr.Krisinski   Had shoulder X-ray: OA  Cervical and thoracic spine was no changes.        Relevant past medical, surgical, family and social history reviewed and updated as indicated. Interim medical history since our last visit reviewed. Allergies and medications reviewed and updated. Outpatient Medications Prior to Visit  Medication Sig Dispense Refill  . diclofenac (VOLTAREN) 75 MG EC tablet Take 75 mg by mouth 2 (two) times daily.    . famotidine (PEPCID) 20 MG tablet TAKE 1 TABLET BY MOUTH EVERYDAY AT BEDTIME 90 tablet 1  . ibuprofen (ADVIL,MOTRIN) 200 MG tablet Take 600 mg by mouth every 6 (six) hours as needed for headache or mild pain.     Marland Kitchen lisinopril-hydrochlorothiazide (ZESTORETIC) 20-12.5 MG tablet TAKE 1 TABLET BY MOUTH EVERY DAY 90 tablet 1  . triamcinolone (NASACORT) 55 MCG/ACT AERO nasal inhaler Place 2 sprays into the nose  every evening.     . cyclobenzaprine (FLEXERIL) 10 MG tablet Take 1 tablet (10 mg total) by mouth at bedtime as needed for muscle spasms. 15 tablet 0   No facility-administered medications prior to visit.     Per HPI unless specifically indicated in ROS section below Review of Systems  Constitutional: Negative for fatigue and fever.  HENT: Negative for congestion.   Eyes: Negative for pain.  Respiratory: Negative for cough and shortness of breath.   Cardiovascular: Negative for chest pain, palpitations and leg swelling.  Gastrointestinal: Negative for abdominal pain.  Genitourinary: Negative for dysuria and vaginal bleeding.  Musculoskeletal: Positive for back pain.  Neurological: Negative for syncope, light-headedness and headaches.  Psychiatric/Behavioral: Negative for dysphoric mood.   Objective:  BP 118/80   Pulse 86   Temp (!) 97.4 F (36.3 C) (Temporal)   Ht 5' 5.5" (1.664 m)   Wt 233 lb (105.7 kg)   SpO2 97%   BMI 38.18 kg/m   Wt Readings from Last 3 Encounters:  05/01/20 233 lb (105.7 kg)  03/13/20 225 lb 4 oz (102.2 kg)  02/14/20 226 lb 12 oz (102.9 kg)      Physical Exam Constitutional:      General: She is not in acute distress.    Appearance: Normal appearance. She  is well-developed. She is not ill-appearing or toxic-appearing.  HENT:     Head: Normocephalic.     Right Ear: Hearing, tympanic membrane, ear canal and external ear normal. Tympanic membrane is not erythematous, retracted or bulging.     Left Ear: Hearing, tympanic membrane, ear canal and external ear normal. Tympanic membrane is not erythematous, retracted or bulging.     Nose: No mucosal edema or rhinorrhea.     Right Sinus: No maxillary sinus tenderness or frontal sinus tenderness.     Left Sinus: No maxillary sinus tenderness or frontal sinus tenderness.     Mouth/Throat:     Pharynx: Uvula midline.  Eyes:     General: Lids are normal. Lids are everted, no foreign bodies appreciated.      Conjunctiva/sclera: Conjunctivae normal.     Pupils: Pupils are equal, round, and reactive to light.  Neck:     Thyroid: No thyroid mass or thyromegaly.     Vascular: No carotid bruit.     Trachea: Trachea normal.  Cardiovascular:     Rate and Rhythm: Normal rate and regular rhythm.     Pulses: Normal pulses.     Heart sounds: Normal heart sounds, S1 normal and S2 normal. No murmur heard. No friction rub. No gallop.   Pulmonary:     Effort: Pulmonary effort is normal. No tachypnea or respiratory distress.     Breath sounds: Normal breath sounds. No decreased breath sounds, wheezing, rhonchi or rales.  Abdominal:     General: Bowel sounds are normal.     Palpations: Abdomen is soft.     Tenderness: There is abdominal tenderness in the right lower quadrant. There is no right CVA tenderness or left CVA tenderness. Negative signs include Murphy's sign and McBurney's sign.  Musculoskeletal:     Cervical back: Normal range of motion and neck supple.     Lumbar back: Tenderness present. Normal range of motion. Negative right straight leg raise test and negative left straight leg raise test.       Back:  Skin:    General: Skin is warm and dry.     Findings: No rash.  Neurological:     Mental Status: She is alert.  Psychiatric:        Mood and Affect: Mood is not anxious or depressed.        Speech: Speech normal.        Behavior: Behavior normal. Behavior is cooperative.        Thought Content: Thought content normal.        Judgment: Judgment normal.       Results for orders placed or performed in visit on 03/13/20  HM MAMMOGRAPHY  Result Value Ref Range   HM Mammogram 0-4 Bi-Rad 0-4 Bi-Rad, Self Reported Normal    This visit occurred during the SARS-CoV-2 public health emergency.  Safety protocols were in place, including screening questions prior to the visit, additional usage of staff PPE, and extensive cleaning of exam room while observing appropriate contact time as indicated  for disinfecting solutions.   COVID 19 screen:  No recent travel or known exposure to COVID19 The patient denies respiratory symptoms of COVID 19 at this time. The importance of social distancing was discussed today.   Assessment and Plan    Problem List Items Addressed This Visit    Acute right-sided low back pain without sciatica   Relevant Medications   diclofenac (VOLTAREN) 75 MG EC tablet   Acute thoracic back  pain - Primary   Relevant Medications   diclofenac (VOLTAREN) 75 MG EC tablet     Start PT for shoulder and thoracic spine.. if not improving call.  Continue diclofenac twice daily.    Kerby Nora, MD

## 2020-07-02 ENCOUNTER — Other Ambulatory Visit: Payer: Self-pay | Admitting: Gastroenterology

## 2020-10-01 ENCOUNTER — Other Ambulatory Visit: Payer: Self-pay | Admitting: Gastroenterology

## 2020-10-01 ENCOUNTER — Other Ambulatory Visit: Payer: Self-pay | Admitting: Family Medicine

## 2020-10-31 ENCOUNTER — Other Ambulatory Visit: Payer: Self-pay | Admitting: Gastroenterology

## 2020-11-04 LAB — HM MAMMOGRAPHY

## 2020-12-02 ENCOUNTER — Other Ambulatory Visit: Payer: Self-pay | Admitting: Gastroenterology

## 2020-12-11 ENCOUNTER — Ambulatory Visit: Payer: BC Managed Care – PPO | Admitting: Family Medicine

## 2020-12-11 ENCOUNTER — Other Ambulatory Visit: Payer: Self-pay | Admitting: Family Medicine

## 2020-12-11 MED ORDER — FLUCONAZOLE 150 MG PO TABS: 150.0000 mg | ORAL_TABLET | Freq: Once | ORAL | 0 refills | Status: AC

## 2020-12-23 ENCOUNTER — Other Ambulatory Visit: Payer: Self-pay | Admitting: Gastroenterology

## 2021-01-10 ENCOUNTER — Other Ambulatory Visit: Payer: Self-pay | Admitting: Gastroenterology

## 2021-01-14 ENCOUNTER — Other Ambulatory Visit: Payer: Self-pay

## 2021-01-14 ENCOUNTER — Emergency Department
Admission: EM | Admit: 2021-01-14 | Discharge: 2021-01-15 | Disposition: A | Discharge status: Home / Self Care | Payer: BC Managed Care – PPO | Attending: Emergency Medicine | Admitting: Emergency Medicine

## 2021-01-14 DIAGNOSIS — I10 Essential (primary) hypertension: Secondary | ICD-10-CM | POA: Insufficient documentation

## 2021-01-14 DIAGNOSIS — S91032A Puncture wound without foreign body, left ankle, initial encounter: Secondary | ICD-10-CM | POA: Diagnosis not present

## 2021-01-14 DIAGNOSIS — Z79899 Other long term (current) drug therapy: Secondary | ICD-10-CM | POA: Insufficient documentation

## 2021-01-14 DIAGNOSIS — S90572A Other superficial bite of ankle, left ankle, initial encounter: Secondary | ICD-10-CM | POA: Diagnosis present

## 2021-01-14 DIAGNOSIS — W5911XA Bitten by nonvenomous snake, initial encounter: Secondary | ICD-10-CM | POA: Insufficient documentation

## 2021-01-14 DIAGNOSIS — Y92009 Unspecified place in unspecified non-institutional (private) residence as the place of occurrence of the external cause: Secondary | ICD-10-CM | POA: Diagnosis not present

## 2021-01-14 MED ORDER — ONDANSETRON HCL 4 MG PO TABS: 4.0000 mg | ORAL_TABLET | Freq: Three times a day (TID) | ORAL | 0 refills | Status: DC | PRN

## 2021-01-14 MED ORDER — MORPHINE SULFATE (PF) 4 MG/ML IV SOLN
4.0000 mg | Freq: Once | INTRAVENOUS | Status: AC
  Administered 2021-01-14: 4 mg via INTRAVENOUS
  Filled 2021-01-14: qty 1

## 2021-01-14 MED ORDER — OXYCODONE-ACETAMINOPHEN 5-325 MG PO TABS: 1.0000 | ORAL_TABLET | ORAL | 0 refills | Status: DC | PRN

## 2021-01-14 MED ORDER — HYDROMORPHONE HCL 1 MG/ML IJ SOLN
1.0000 mg | Freq: Once | INTRAMUSCULAR | Status: AC
  Administered 2021-01-14: 1 mg via INTRAVENOUS
  Filled 2021-01-14: qty 1

## 2021-01-14 MED ORDER — SODIUM CHLORIDE 0.9 % IV BOLUS
1000.0000 mL | Freq: Once | INTRAVENOUS | Status: AC
  Administered 2021-01-14: 1000 mL via INTRAVENOUS

## 2021-01-14 MED ORDER — FENTANYL CITRATE PF 50 MCG/ML IJ SOSY
50.0000 ug | PREFILLED_SYRINGE | Freq: Once | INTRAMUSCULAR | Status: AC
  Administered 2021-01-14: 50 ug via INTRAVENOUS
  Filled 2021-01-14: qty 1

## 2021-01-14 NOTE — ED Provider Notes (Signed)
Plan is for----------------------------------------- 11:59 PM on 01/14/2021 -----------------------------------------  Assuming care from Dr. Derrill Kay.  In short, Jane Li is a 50 y.o. female with a chief complaint of snakebite to LLE.  Refer to the original H&P for additional details.  The current plan of care is to reassess.   ----------------------------------------- 1:33 AM on 01/15/2021 -----------------------------------------  No progression of patient's swelling.  She still has tenderness to palpation but compartments are soft and easily compressible.  Neurovascularly intact.  Discharge and outpatient follow-up.  I gave my usual and customary snakebite management recommendations and return precautions and also provided a pair of crutches.  Prescriptions provided by Dr. Derrill Kay.  I discussed the plan with both the patient and her husband and they are in agreement.   Loleta Rose, MD 01/15/21 [phone]

## 2021-01-14 NOTE — ED Triage Notes (Signed)
Pt BIB EMS from home for snake bite to L ankle. Pt felt something, looked down & saw what she is certain was a copper head. 2 puncture wound to outside of L ankle with some dried blood, swelling noted.

## 2021-01-14 NOTE — ED Notes (Signed)
Lt foot assessment. CMS/ circulation intact

## 2021-01-14 NOTE — ED Notes (Signed)
Secure msg sent to Dr. Derrill Kay re: pain medication

## 2021-01-14 NOTE — Discharge Instructions (Addendum)
Please seek medical attention for any high fevers, chest pain, shortness of breath, change in behavior, persistent vomiting, bloody stool or any other new or concerning symptoms.  

## 2021-01-14 NOTE — ED Notes (Signed)
Pt has snake bite (she states she is almost certain was a copper head-pt saw snake) to outer L ankle; 2 puncture wounds noted with small amnt of dried blood, swelling noted.

## 2021-01-14 NOTE — ED Provider Notes (Signed)
[facility] Emergency Department Provider Note    ____________________________________________   I have reviewed the triage vital signs and the nursing notes.   HISTORY  Chief Complaint Snake Bite   History limited by: Not Limited   HPI Jane Li is a 50 y.o. female who presents to the emergency department today because of concern for snake bite. She states she was taking out her trash when she felt a pain the left ankle. When she looked down she saw a snake which she believes to be a copper head. This occurred roughly 1 hour prior to my evaluation. The patient states that she is having burning and pain to that area. It is located both at the site of bite as well as her calf. The patient did have some nausea shortly after it started however thinks that might have been due to some anxiety, as it is better at the time of my exam.   Records reviewed. Per medical record review patient has a history of tdap administered 03/13/2020  Past Medical History:  Diagnosis Date   Allergy    Chronic headaches    2/WEEK SINUS AND STRESS HEADACHES   Deviated nasal septum    GERD (gastroesophageal reflux disease)    Hyperlipidemia    Hypertension    CONTROLLED ON MEDS   Motion sickness    CAR, AMUSEMENT PARK   Nasal turbinate hypertrophy    NASAL OBSTRUCTION   Obesity    Shortness of breath dyspnea    ON EXERTION   Sinusitis, chronic     Patient Active Problem List   Diagnosis Date Noted   Acute thoracic back pain 02/17/2020   Right lower quadrant pain 02/14/2020   Prediabetes 12/13/2018   Symptomatic mammary hypertrophy 12/04/2018   Hot flashes 10/19/2017   Situational mixed anxiety and depressive disorder 10/19/2017   S/P PICC central line placement 09/16/2016   Septic thrombophlebitis 09/16/2016   Acute right-sided low back pain without sciatica 09/16/2016   Vertigo 09/16/2016   Bacteremia due to Enterococcus 09/05/2016   GAD (generalized anxiety  disorder) 02/19/2016   Decreased libido 12/19/2014   Neck pain 09/05/2014   Helicobacter pylori gastritis 11/12/2013   High cholesterol 02/01/2013   HTN (hypertension) 04/13/2012   GERD 07/30/2009   BMI 37.0-37.9, adult 05/11/2007   Migraine headache without aura 05/03/2007   Allergic rhinitis 05/03/2007   FATIGUE, CHRONIC 08/15/2006    Past Surgical History:  Procedure Laterality Date   CESAREAN SECTION  2002   COLONOSCOPY     FRONTAL SINUS EXPLORATION Bilateral 10/08/2015   Procedure: FRONTAL SINUS EXPLORATION BILATERAL;  Surgeon: Vernie Murders, MD;  Location: [facility];  Service: ENT;  Laterality: Bilateral;   IMAGE GUIDED SINUS SURGERY N/A 10/08/2015   Procedure: IMAGE GUIDED SINUS SURGERY;  Surgeon: Vernie Murders, MD;  Location: [facility];  Service: ENT;  Laterality: N/A;  GAVE DISK TO [facility] 5/24 DEE UPREG   MAXILLARY ANTROSTOMY Bilateral 10/08/2015   Procedure: MAXILLARY ANTROSTOMY BILATERAL;  Surgeon: Vernie Murders, MD;  Location: [facility];  Service: ENT;  Laterality: Bilateral;   SEPTOPLASTY N/A 10/08/2015   Procedure: Sharlett Iles;  Surgeon: Vernie Murders, MD;  Location: [facility];  Service: ENT;  Laterality: N/A;   TEE WITHOUT CARDIOVERSION N/A 09/07/2016   Procedure: TRANSESOPHAGEAL ECHOCARDIOGRAM (TEE);  Surgeon: Wendall Stade, MD;  Location: [facility];  Service: Cardiovascular;  Laterality: N/A;   TURBINATE REDUCTION Bilateral 10/08/2015   Procedure: TURBINATE REDUCTION BILATERAL;  Surgeon: Vernie Murders,  MD;  Location: [facility];  Service: ENT;  Laterality: Bilateral;   UPPER GASTROINTESTINAL ENDOSCOPY  03-18-2014   WISDOM TOOTH EXTRACTION      Prior to Admission medications   Medication Sig Start Date End Date Taking? Authorizing Provider  diclofenac (VOLTAREN) 75 MG EC tablet Take 75 mg by mouth 2 (two) times daily. 04/08/20   [provider]  famotidine (PEPCID) 20 MG tablet TAKE 1 TABLET BY MOUTH EVERYDAY AT  BEDTIME 12/03/20   Rachael Fee, MD  ibuprofen (ADVIL,MOTRIN) 200 MG tablet Take 600 mg by mouth every 6 (six) hours as needed for headache or mild pain.     [provider]  lisinopril-hydrochlorothiazide (ZESTORETIC) 20-12.5 MG tablet TAKE 1 TABLET BY MOUTH EVERY DAY 10/01/20   Bedsole, Amy E, MD  triamcinolone (NASACORT) 55 MCG/ACT AERO nasal inhaler Place 2 sprays into the nose every evening.     [provider]    Allergies Codeine  Family History  Problem Relation Age of Onset   Depression Mother    Mental illness Mother    Hypertension Mother    Arthritis Father    Thyroid disease Father    Mental illness Sister    Breast cancer Maternal Grandmother    Diabetes Paternal Grandmother    Colon cancer Neg Hx    Stomach cancer Neg Hx     Social History Social History   Tobacco Use   Smoking status: Never   Smokeless tobacco: Never  Substance Use Topics   Alcohol use: Yes    Alcohol/week: 0.0 standard drinks    Comment: rare   Drug use: No    Review of Systems Constitutional: No fever/chills Eyes: No visual changes. ENT: No sore throat. Cardiovascular: Denies chest pain. Respiratory: Denies shortness of breath. Gastrointestinal: No abdominal pain.  No nausea, no vomiting.  No diarrhea.   Genitourinary: Negative for dysuria. Musculoskeletal: Positive for left ankle pain and swelling.  Skin: Positive for puncture type wound to left ankle. Neurological: Negative for headaches, focal weakness or numbness.  ____________________________________________   PHYSICAL EXAM:  VITAL SIGNS: ED Triage Vitals  Enc Vitals Group     BP 01/14/21 2015 (!) 146/89     Pulse Rate 01/14/21 2015 (!) 57     Resp 01/14/21 2015 18     Temp 01/14/21 2015 98.4 F (36.9 C)     Temp Source 01/14/21 2015 Oral     SpO2 01/14/21 2015 97 %     Weight 01/14/21 2018 228 lb (103.4 kg)     Height 01/14/21 2018 5\' 4"  (1.626 m)     Head Circumference --      Peak Flow --       Pain Score 01/14/21 2016 7   Constitutional: Alert and oriented.  Eyes: Conjunctivae are normal.  ENT      Head: Normocephalic and atraumatic.      Nose: No congestion/rhinnorhea.      Mouth/Throat: Mucous membranes are moist.      Neck: No stridor. Hematological/Lymphatic/Immunilogical: No cervical lymphadenopathy. Cardiovascular: Normal rate, regular rhythm.  No murmurs, rubs, or gallops.  Respiratory: Normal respiratory effort without tachypnea nor retractions. Breath sounds are clear and equal bilaterally. No wheezes/rales/rhonchi. Gastrointestinal: Soft and non tender. No rebound. No guarding.  Genitourinary: Deferred Musculoskeletal: Swelling to left lateral ankle. Neurologic:  Normal speech and language. No gross focal neurologic deficits are appreciated.  Skin:  Two small puncture type wounds to lateral left ankle.  Psychiatric: Mood and affect  are normal. Speech and behavior are normal. Patient exhibits appropriate insight and judgment.  ____________________________________________    LABS (pertinent positives/negatives)  None  ____________________________________________   EKG  None  ____________________________________________    RADIOLOGY  None  ____________________________________________   PROCEDURES  Procedures  ____________________________________________   INITIAL IMPRESSION / ASSESSMENT AND PLAN / ED COURSE  Pertinent labs & imaging results that were available during my care of the patient were reviewed by me and considered in my medical decision making (see chart for details).   Patient presented to the emergency department today because of concerns for likely copperhead snake bite to her left ankle.  On exam patient does have 2 puncture type wounds that could be consistent with a possible snake bite.  Does have some slight swelling.  I did have a discussion with the patient about possible antivenom.  Did discuss possible improvement and  duration of pain control and functional rehabilitation.  However also discussed possibility of side effects of antivenom.  This time patient felt comfortable deferring.  We will attempt pain control here in the emergency department and observe for worsening swelling/pain.  Patient's pain has come up to roughly mid shin/calf. No tightness to suggest compartment syndrome. Will continue pain control.    ____________________________________________   FINAL CLINICAL IMPRESSION(S) / ED DIAGNOSES  Final diagnoses:  Snake bite, initial encounter     Note: This dictation was prepared with Dragon dictation. Any transcriptional errors that result from this process are unintentional     Phineas Semen, MD 01/14/21 2346

## 2021-01-15 ENCOUNTER — Telehealth: Payer: Self-pay | Admitting: Family Medicine

## 2021-01-15 MED ORDER — OXYCODONE-ACETAMINOPHEN 5-325 MG PO TABS
2.0000 | ORAL_TABLET | Freq: Once | ORAL | Status: AC
  Administered 2021-01-15: 2 via ORAL
  Filled 2021-01-15: qty 2

## 2021-01-15 NOTE — Telephone Encounter (Signed)
[facility] - Client TELEPHONE ADVICE RECORD AccessNurse Patient Name: Jane Li Gender: Female DOB: 24-May-1970 Age: 50 Y 8 M Return Phone Number: [phone] (Primary), [phone] (Secondary) Address: City/ State/ Zip: [place]  [address] Client [facility] - Client Client Site [facility] - Day Physician Kerby Nora - MD Contact Type Call Who Is Calling Patient / Member / Family / Caregiver Call Type Triage / Clinical Relationship To Patient Self Return Phone Number [phone] (Primary) Chief Complaint SNAKE BITE Reason for Call Symptomatic / Request for Health Information Initial Comment Caller states she was bite by a cooper head snake last at 7;15 and she went to the ED and they released her but she is still having a lot of swelling. Left ankle GOTO Facility Not Listed [facility] ER Translation No Nurse Assessment Nurse: Vear Clock, RN, Elease Hashimoto Date/Time Lamount Cohen Time): 01/15/2021 11:27:43 AM Confirm and document reason for call. If symptomatic, describe symptoms. ---Caller states she was bite by a cooper head snake last at 7;15 and she went to the ED and they released her but she is still having a lot of swelling. Left ankle She was given pain meds, and monitored her blood pressure. The swelling is worse. slowing move upward. discoloration non her toes and the top of the foot Does the patient have any new or worsening symptoms? ---Yes Will a triage be completed? ---Yes Related visit to physician within the last 2 weeks? ---Yes Does the PT have any chronic conditions? (i.e. diabetes, asthma, this includes High risk factors for pregnancy, etc.) ---Yes List chronic conditions. ---hypertension, reflux Is the patient pregnant or possibly pregnant? (Ask all females between the ages of 48-55) ---No Is this a behavioral health or substance abuse call? ---No Guidelines Guideline Title  Affirmed Question Affirmed Notes Nurse Date/Time (Eastern Time) Snakebite - [place] You feel sick in any way (e.g., diarrhea, Emiliano Dyer 01/15/2021 11:29:45 AM PLEASE NOTE: All timestamps contained within this report are represented as [place] Standard Time. CONFIDENTIALTY NOTICE: This fax transmission is intended only for the addressee. It contains information that is legally privileged, confidential or otherwise protected from use or disclosure. If you are not the intended recipient, you are strictly prohibited from reviewing, disclosing, copying using or disseminating any of this information or taking any action in reliance on or regarding this information. If you have received this fax in error, please notify us immediately by telephone so that we can arrange for its return to [place]. Phone: [phone], Toll-Free: [phone], Fax: [phone] Page: 2 of 2 Call Id: [id number] Guidelines Guideline Title Affirmed Question Affirmed Notes Nurse Date/Time Lamount Cohen Time) metallic taste in mouth, muscle twitching, nausea, sweating, weakness) Disp. Time Lamount Cohen Time) Disposition Final User 01/15/2021 11:26:19 AM Send to Urgent Elyse Hsu 01/15/2021 11:33:19 AM Go to ED Now Yes Vear Clock, RN, Ancil Boozer Disagree/Comply Comply Caller Understands Yes PreDisposition Go to ED Care Advice Given Per Guideline GO TO ED NOW: CARE ADVICE given per [place] - [place] (Adult) guideline. Comments User: Aviva Kluver, RN Date/Time Lamount Cohen Time): 01/15/2021 11:29:25 AM unable to stand or walk User: Aviva Kluver, RN Date/Time Lamount Cohen Time): 01/15/2021 11:34:04 AM Her s+s have worsen since she was seen in the ER last night. We both agree they are worse and she should be seen asap. Referrals GO TO FACILITY OTHER - [facility]

## 2021-01-15 NOTE — Telephone Encounter (Signed)
Please see note below the access nurse from portal; Dr Ermalene Searing has already noted.

## 2021-01-15 NOTE — Telephone Encounter (Signed)
I spoke with pts husband and he and the pt are presently at [facility] ED. Sending note to Dr Ermalene Searing and Lupita Leash CMA. When access note is in portal will add to this note.

## 2021-01-15 NOTE — Telephone Encounter (Signed)
Noted  

## 2021-01-15 NOTE — ED Notes (Signed)
Lt foot assessment. CMS/ circulation intact. NADN

## 2021-02-04 ENCOUNTER — Ambulatory Visit: Payer: BC Managed Care – PPO | Admitting: Family Medicine

## 2021-02-04 ENCOUNTER — Other Ambulatory Visit: Payer: Self-pay

## 2021-02-04 ENCOUNTER — Encounter: Payer: Self-pay | Admitting: Family Medicine

## 2021-02-04 VITALS — BP 133/88 | HR 119 | Temp 100.4°F | Ht 65.5 in | Wt 237.1 lb

## 2021-02-04 DIAGNOSIS — R509 Fever, unspecified: Secondary | ICD-10-CM | POA: Diagnosis not present

## 2021-02-04 DIAGNOSIS — Z87898 Personal history of other specified conditions: Secondary | ICD-10-CM | POA: Diagnosis not present

## 2021-02-04 DIAGNOSIS — I809 Phlebitis and thrombophlebitis of unspecified site: Secondary | ICD-10-CM

## 2021-02-04 DIAGNOSIS — W5911XD Bitten by nonvenomous snake, subsequent encounter: Secondary | ICD-10-CM

## 2021-02-04 DIAGNOSIS — J111 Influenza due to unidentified influenza virus with other respiratory manifestations: Secondary | ICD-10-CM

## 2021-02-04 DIAGNOSIS — W5911XA Bitten by nonvenomous snake, initial encounter: Secondary | ICD-10-CM | POA: Insufficient documentation

## 2021-02-04 LAB — POCT URINALYSIS DIP (MANUAL ENTRY)
Bilirubin, UA: NEGATIVE
Blood, UA: NEGATIVE
Glucose, UA: NEGATIVE mg/dL
Leukocytes, UA: NEGATIVE
Nitrite, UA: NEGATIVE
Protein Ur, POC: 30 mg/dL — AB
Spec Grav, UA: 1.015 (ref 1.010–1.025)
Urobilinogen, UA: 0.2 E.U./dL
pH, UA: 6 (ref 5.0–8.0)

## 2021-02-04 LAB — CBC WITH DIFFERENTIAL/PLATELET
Basophils Absolute: 0 10*3/uL (ref 0.0–0.1)
Basophils Relative: 0.9 % (ref 0.0–3.0)
Eosinophils Absolute: 0.1 10*3/uL (ref 0.0–0.7)
Eosinophils Relative: 1.3 % (ref 0.0–5.0)
HCT: 40.9 % (ref 36.0–46.0)
Hemoglobin: 13.9 g/dL (ref 12.0–15.0)
Lymphocytes Relative: 21.1 % (ref 12.0–46.0)
Lymphs Abs: 0.9 10*3/uL (ref 0.7–4.0)
MCHC: 34.1 g/dL (ref 30.0–36.0)
MCV: 94 fl (ref 78.0–100.0)
Monocytes Absolute: 0.6 10*3/uL (ref 0.1–1.0)
Monocytes Relative: 13.6 % — ABNORMAL HIGH (ref 3.0–12.0)
Neutro Abs: 2.8 10*3/uL (ref 1.4–7.7)
Neutrophils Relative %: 63.1 % (ref 43.0–77.0)
Platelets: 261 10*3/uL (ref 150.0–400.0)
RBC: 4.35 Mil/uL (ref 3.87–5.11)
RDW: 13.8 % (ref 11.5–15.5)
WBC: 4.5 10*3/uL (ref 4.0–10.5)

## 2021-02-04 LAB — POC INFLUENZA A&B (BINAX/QUICKVUE)
Influenza A, POC: POSITIVE — AB
Influenza B, POC: NEGATIVE

## 2021-02-04 MED ORDER — FAMOTIDINE 20 MG PO TABS: ORAL_TABLET | ORAL | 0 refills | Status: DC

## 2021-02-04 MED ORDER — BENZONATATE 200 MG PO CAPS: 200.0000 mg | ORAL_CAPSULE | Freq: Two times a day (BID) | ORAL | 0 refills | Status: DC | PRN

## 2021-02-04 NOTE — Progress Notes (Signed)
Patient ID: Jane Li, female    DOB: 1971-03-30, 50 y.o.   MRN: [id number]  This visit was conducted in person.  BP 90/72   Pulse (!) 121   Temp (!) 100.4 F (38 C) (Temporal)   Ht 5' 5.5" (1.664 m)   Wt 237 lb 2 oz (107.6 kg)   SpO2 96%   BMI 38.86 kg/m    CC: Chief Complaint  Patient presents with   Follow-up    Snake Bite-Left Ankle   Knee Pain    Right    Subjective:   HPI: Jane Li is a 50 y.o. female presenting on 02/04/2021 for Follow-up (Snake Bite-Left Ankle) and Knee Pain (Right)  Hx of sepsis due thrombophlebitis  in ankle./cellulitis vs untreated UTI from enterococcus in 2018.Marland Kitchen at that time treated with PICC line Ampicillin 1 month.  Symptoms at that time were right knee pain.    She ha a copperhead snake bite on  left ankle on 01/14/2021  ER notes reviewed.  10/6 ER note:  Treated with pain meds, IVF, ondansetron Returned 10/7 given increased leg pain and swelling  Had no sign of compartment syndrome CBC showed leukocytosis 16.4  Today she reports she has had resolution of pain and swelling in left lower leg. She has been fairly immobile in last few weeks given pain.  She did have a knee injury in September and had a few days of pain then resolved.   Now evert since  snake bite she has had occurrence  No redness, no swelling in right knee.  She has body aches, neck stiffness, headache , congestion, cough in last 24 hours.  No COVID exposure.  Hs not had COVID test yet.  Her HR is elevated and BP lower than usual. BP Readings from Last 3 Encounters:  02/04/21 90/72  01/15/21 117/77  05/01/20 118/80   Normal UOP, nml fluid intake. Low grade temp today  COVID last week of September     Relevant past medical, surgical, family and social history reviewed and updated as indicated. Interim medical history since our last visit reviewed. Allergies and medications reviewed and updated. Outpatient Medications Prior to Visit  Medication  Sig Dispense Refill   famotidine (PEPCID) 20 MG tablet TAKE 1 TABLET BY MOUTH EVERYDAY AT BEDTIME 90 tablet 0   lisinopril-hydrochlorothiazide (ZESTORETIC) 20-12.5 MG tablet TAKE 1 TABLET BY MOUTH EVERY DAY 90 tablet 1   triamcinolone (NASACORT) 55 MCG/ACT AERO nasal inhaler Place 2 sprays into the nose every evening.      diclofenac (VOLTAREN) 75 MG EC tablet Take 75 mg by mouth 2 (two) times daily.     ibuprofen (ADVIL,MOTRIN) 200 MG tablet Take 600 mg by mouth every 6 (six) hours as needed for headache or mild pain.      ondansetron (ZOFRAN) 4 MG tablet Take 1 tablet (4 mg total) by mouth every 8 (eight) hours as needed for vomiting or nausea. 20 tablet 0   oxyCODONE-acetaminophen (PERCOCET) 5-325 MG tablet Take 1 tablet by mouth every 4 (four) hours as needed for severe pain. 20 tablet 0   No facility-administered medications prior to visit.     Per HPI unless specifically indicated in ROS section below Review of Systems  Constitutional:  Positive for fatigue. Negative for fever.  HENT:  Positive for congestion.   Eyes:  Negative for pain.  Respiratory:  Positive for cough. Negative for shortness of breath.   Cardiovascular:  Negative for chest pain, palpitations and  leg swelling.  Gastrointestinal:  Negative for abdominal pain.  Genitourinary:  Negative for dysuria and vaginal bleeding.  Musculoskeletal:  Negative for back pain.  Neurological:  Negative for syncope, light-headedness and headaches.  Psychiatric/Behavioral:  Negative for dysphoric mood.   Objective:  BP 90/72   Pulse (!) 121   Temp (!) 100.4 F (38 C) (Temporal)   Ht 5' 5.5" (1.664 m)   Wt 237 lb 2 oz (107.6 kg)   SpO2 96%   BMI 38.86 kg/m   Wt Readings from Last 3 Encounters:  02/04/21 237 lb 2 oz (107.6 kg)  01/14/21 228 lb (103.4 kg)  05/01/20 233 lb (105.7 kg)      Physical Exam Constitutional:      General: She is not in acute distress.    Appearance: Normal appearance. She is well-developed. She is  ill-appearing. She is not toxic-appearing.  HENT:     Head: Normocephalic.     Right Ear: Hearing, tympanic membrane, ear canal and external ear normal. Tympanic membrane is not erythematous, retracted or bulging.     Left Ear: Hearing, tympanic membrane, ear canal and external ear normal. Tympanic membrane is not erythematous, retracted or bulging.     Nose: No mucosal edema or rhinorrhea.     Right Sinus: No maxillary sinus tenderness or frontal sinus tenderness.     Left Sinus: No maxillary sinus tenderness or frontal sinus tenderness.     Mouth/Throat:     Pharynx: Uvula midline.  Eyes:     General: Lids are normal. Lids are everted, no foreign bodies appreciated.     Conjunctiva/sclera: Conjunctivae normal.     Pupils: Pupils are equal, round, and reactive to light.  Neck:     Thyroid: No thyroid mass or thyromegaly.     Vascular: No carotid bruit.     Trachea: Trachea normal.  Cardiovascular:     Rate and Rhythm: Normal rate and regular rhythm.     Pulses: Normal pulses.     Heart sounds: Normal heart sounds, S1 normal and S2 normal. No murmur heard.   No friction rub. No gallop.  Pulmonary:     Effort: Pulmonary effort is normal. No tachypnea or respiratory distress.     Breath sounds: Normal breath sounds. No decreased breath sounds, wheezing, rhonchi or rales.  Abdominal:     General: Bowel sounds are normal.     Palpations: Abdomen is soft.     Tenderness: There is no abdominal tenderness.  Musculoskeletal:     Cervical back: Normal range of motion and neck supple.  Skin:    General: Skin is warm and dry.     Findings: No rash.     Comments: No swelling in legs bilaterally No rash at site of previous snake bite.  Neurological:     Mental Status: She is alert.  Psychiatric:        Mood and Affect: Mood is not anxious or depressed.        Speech: Speech normal.        Behavior: Behavior normal. Behavior is cooperative.        Thought Content: Thought content normal.         Judgment: Judgment normal.      Results for orders placed or performed in visit on 03/13/20  HM MAMMOGRAPHY  Result Value Ref Range   HM Mammogram 0-4 Bi-Rad 0-4 Bi-Rad, Self Reported Normal    This visit occurred during the SARS-CoV-2 public health emergency.  Safety  protocols were in place, including screening questions prior to the visit, additional usage of staff PPE, and extensive cleaning of exam room while observing appropriate contact time as indicated for disinfecting solutions.   COVID 19 screen:  No recent travel or known exposure to COVID19 The patient denies respiratory symptoms of COVID 19 at this time. The importance of social distancing was discussed today.   Assessment and Plan  Fever, unclear cause in patient with recent snake bite as well as history of sepsis from  thrombophlebitis.  Will check UA CBC and blood cultures.  Consider home COVID test, flu test in office performed.  No clear current thrombophlebitis, cellultitis etc Orders Placed This Encounter  Procedures   Culture, blood (single)   Culture, blood (single)   CBC with Differential/Platelet   POC Influenza A&B(BINAX/QUICKVUE)   POCT urinalysis dipstick      Addendum: Flu test positive.  Will treat with  cough suppressant.  Out of time course for antiviral treatment. Meds ordered this encounter  Medications   benzonatate (TESSALON) 200 MG capsule    Sig: Take 1 capsule (200 mg total) by mouth 2 (two) times daily as needed for cough.    Dispense:  20 capsule    Refill:  0       Kerby Nora, MD

## 2021-02-04 NOTE — Telephone Encounter (Signed)
As discussed.. please have her make appt to be seen in office today.

## 2021-02-04 NOTE — Telephone Encounter (Signed)
Famotidine last refilled by GI who states she needs a follow up for refill.  Okay to refill?

## 2021-02-04 NOTE — Telephone Encounter (Signed)
Appointment scheduled today at 11:00 am with Dr. Ermalene Searing.

## 2021-02-04 NOTE — Patient Instructions (Addendum)
I will call with labs results.  Consider tamiflu for influenza.  Rest, fluids, tylenol for fever, headache.

## 2021-02-10 LAB — CULTURE, BLOOD (SINGLE)
MICRO NUMBER:: 12560234
MICRO NUMBER:: 12560235
Result:: NO GROWTH
Result:: NO GROWTH
SPECIMEN QUALITY:: ADEQUATE
SPECIMEN QUALITY:: ADEQUATE

## 2021-04-01 ENCOUNTER — Telehealth: Payer: Self-pay | Admitting: Family Medicine

## 2021-04-01 NOTE — Telephone Encounter (Signed)
Please schedule CPE with fasting labs prior for Dr. Ermalene Searing sometime in the next 3 months.

## 2021-04-01 NOTE — Telephone Encounter (Signed)
Mrs. Laylanie called back in and scheduled for 3/17 for labs and 3/24 for CPE

## 2021-04-01 NOTE — Telephone Encounter (Signed)
Lvm  and sent mychart letter for pt to schedule a LAB/CPE

## 2021-04-28 ENCOUNTER — Other Ambulatory Visit: Payer: Self-pay | Admitting: Family Medicine

## 2021-06-16 ENCOUNTER — Telehealth: Payer: Self-pay | Admitting: Family Medicine

## 2021-06-16 DIAGNOSIS — E7800 Pure hypercholesterolemia, unspecified: Secondary | ICD-10-CM

## 2021-06-16 NOTE — Telephone Encounter (Signed)
-----   Message from Ellamae Sia sent at 06/15/2021  4:33 PM EST ----- ?Regarding: lab orders for Friday, 3.17.23 ?Patient is scheduled for CPX labs, please order future labs, Thanks , Terri ? ? ?

## 2021-06-25 ENCOUNTER — Other Ambulatory Visit: Payer: Self-pay

## 2021-06-25 ENCOUNTER — Other Ambulatory Visit (INDEPENDENT_AMBULATORY_CARE_PROVIDER_SITE_OTHER): Payer: BC Managed Care – PPO

## 2021-06-25 DIAGNOSIS — E7800 Pure hypercholesterolemia, unspecified: Secondary | ICD-10-CM | POA: Diagnosis not present

## 2021-06-25 LAB — COMPREHENSIVE METABOLIC PANEL
ALT: 19 U/L (ref 0–35)
AST: 15 U/L (ref 0–37)
Albumin: 3.9 g/dL (ref 3.5–5.2)
Alkaline Phosphatase: 60 U/L (ref 39–117)
BUN: 13 mg/dL (ref 6–23)
CO2: 29 mEq/L (ref 19–32)
Calcium: 9.2 mg/dL (ref 8.4–10.5)
Chloride: 100 mEq/L (ref 96–112)
Creatinine, Ser: 0.72 mg/dL (ref 0.40–1.20)
GFR: 95.67 mL/min (ref >60.00)
Glucose, Bld: 98 mg/dL (ref 70–99)
Potassium: 4 mEq/L (ref 3.5–5.1)
Sodium: 137 mEq/L (ref 135–145)
Total Bilirubin: 0.5 mg/dL (ref 0.2–1.2)
Total Protein: 6.7 g/dL (ref 6.0–8.3)

## 2021-06-25 LAB — LIPID PANEL
Cholesterol: 207 mg/dL — ABNORMAL HIGH (ref 0–200)
HDL: 62.7 mg/dL (ref >39.00)
LDL Cholesterol: 117 mg/dL — ABNORMAL HIGH (ref 0–99)
NonHDL: 144.5
Total CHOL/HDL Ratio: 3
Triglycerides: 136 mg/dL (ref 0.0–149.0)
VLDL: 27.2 mg/dL (ref 0.0–40.0)

## 2021-06-25 NOTE — Progress Notes (Signed)
No critical labs need to be addressed urgently. We will discuss labs in detail at upcoming office visit.   

## 2021-07-02 ENCOUNTER — Encounter: Payer: BC Managed Care – PPO | Admitting: Family Medicine

## 2021-07-07 ENCOUNTER — Other Ambulatory Visit: Payer: Self-pay | Admitting: Family Medicine

## 2021-07-28 ENCOUNTER — Other Ambulatory Visit: Payer: Self-pay | Admitting: Family Medicine

## 2021-09-03 ENCOUNTER — Ambulatory Visit (INDEPENDENT_AMBULATORY_CARE_PROVIDER_SITE_OTHER): Payer: BC Managed Care – PPO | Admitting: Family Medicine

## 2021-09-03 ENCOUNTER — Encounter: Payer: Self-pay | Admitting: Family Medicine

## 2021-09-03 VITALS — BP 114/72 | HR 75 | Temp 98.0°F | Ht 65.5 in | Wt 242.0 lb

## 2021-09-03 DIAGNOSIS — E661 Drug-induced obesity: Secondary | ICD-10-CM | POA: Diagnosis not present

## 2021-09-03 DIAGNOSIS — R7303 Prediabetes: Secondary | ICD-10-CM

## 2021-09-03 DIAGNOSIS — I10 Essential (primary) hypertension: Secondary | ICD-10-CM

## 2021-09-03 DIAGNOSIS — Z0000 Encounter for general adult medical examination without abnormal findings: Secondary | ICD-10-CM

## 2021-09-03 DIAGNOSIS — E7800 Pure hypercholesterolemia, unspecified: Secondary | ICD-10-CM

## 2021-09-03 DIAGNOSIS — Z6839 Body mass index (BMI) 39.0-39.9, adult: Secondary | ICD-10-CM

## 2021-09-03 NOTE — Progress Notes (Signed)
Patient ID: Jane Li, female    DOB: [birth year], 51 y.o.   MRN: [id number]  This visit was conducted in person.  BP 114/72   Pulse 75   Temp 98 F (36.7 C) (Oral)   Ht 5' 5.5" (1.664 m)   Wt 242 lb (109.8 kg)   SpO2 96%   BMI 39.66 kg/m    CC:  Chief Complaint  Patient presents with   Annual Exam    Subjective:   HPI: ZANAYAH Li is a 51 y.o. female presenting on 09/03/2021 for Annual Exam   Hypertension:    Stable control on lisinopril HCTZ 20/12.5 mg daily BP Readings from Last 3 Encounters:  09/03/21 114/72  02/04/21 133/88  01/15/21 117/77  Using medication without problems or lightheadedness:  Li Chest pain with exertion: Li Edema: Li Short of breath: Li Average home BPs: Other issues:  Elevated Cholesterol:  Well controlled on  no medication.. increasing fish, protein shake. Lab Results  Component Value Date   CHOL 207 (H) 06/25/2021   HDL 62.70 06/25/2021   LDLCALC 117 (H) 06/25/2021   LDLDIRECT 146.6 05/06/2013   TRIG 136.0 06/25/2021   CHOLHDL 3 06/25/2021   The 10-year ASCVD risk score (Arnett DK, et al., 2019) is: 1.6%   Values used to calculate the score:     Age: 34 years     Sex: Female     Is Non-Hispanic African American: No     Diabetic: No     Tobacco smoker: No     Systolic Blood Pressure: 114 mmHg     Is BP treated: Yes     HDL Cholesterol: 62.7 mg/dL     Total Cholesterol: 207 mg/dL Using medications without problems: Muscle aches:  Diet compliance: moderate Exercise:Li Other complaints:   She has menopausal symptoms, fatigue, s/p ablation ( Li) goes to bed a t 8 PM, sleeps 8 hours.   Prediabetes  Lab Results  Component Value Date   HGBA1C 5.7 03/09/2020   Wt Readings from Last 3 Encounters:  09/03/21 242 lb (109.8 kg)  02/04/21 237 lb 2 oz (107.6 kg)  01/14/21 228 lb (103.4 kg)    Body mass index is 39.66 kg/m.   [facility] Dr[facility] Mayford Knife. Recent  rx given for Ozempic.. she is considering.  She is getting B12 infusions.    Relevant past medical, surgical, family and social history reviewed and updated as indicated. Interim medical history since our last visit reviewed. Allergies and medications reviewed and updated. Outpatient Medications Prior to Visit  Medication Sig Dispense Refill   diclofenac (VOLTAREN) 75 MG EC tablet Take 75 mg by mouth 2 (two) times daily.     famotidine (PEPCID) 20 MG tablet TAKE 1 TABLET BY MOUTH EVERYDAY AT BEDTIME 90 tablet 0   lisinopril-hydrochlorothiazide (ZESTORETIC) 20-12.5 MG tablet TAKE 1 TABLET BY MOUTH EVERY DAY 90 tablet 0   OIL OF OREGANO PO Take 3 drops by mouth daily.     OVER THE COUNTER MEDICATION Paraguard (wormwood,black walnut, black seed oil) 10 day course every three months     OVER THE COUNTER MEDICATION Doterra-Oils-Digestzen and Frankincense     triamcinolone (NASACORT) 55 MCG/ACT AERO nasal inhaler Place 2 sprays into the nose every evening.      benzonatate (TESSALON) 200 MG capsule Take 1 capsule (200 mg total) by mouth 2 (two) times daily as needed for cough. 20 capsule 0   No facility-administered medications  prior to visit.     Per HPI unless specifically indicated in ROS section below Review of Systems  Constitutional:  Negative for fatigue and fever.  HENT:  Negative for congestion.   Eyes:  Negative for pain.  Respiratory:  Negative for cough and shortness of breath.   Cardiovascular:  Negative for chest pain, palpitations and leg swelling.  Gastrointestinal:  Negative for abdominal pain.  Genitourinary:  Negative for dysuria and vaginal bleeding.  Musculoskeletal:  Negative for back pain.  Neurological:  Negative for syncope, light-headedness and headaches.  Psychiatric/Behavioral:  Negative for dysphoric mood.   Objective:  BP 114/72   Pulse 75   Temp 98 F (36.7 C) (Oral)   Ht 5' 5.5" (1.664 m)   Wt 242 lb (109.8 kg)   SpO2 96%   BMI 39.66 kg/m   Wt  Readings from Last 3 Encounters:  09/03/21 242 lb (109.8 kg)  02/04/21 237 lb 2 oz (107.6 kg)  01/14/21 228 lb (103.4 kg)      Physical Exam Vitals and nursing note reviewed.  Constitutional:      General: She is not in acute distress.    Appearance: Normal appearance. She is well-developed. She is obese. She is not ill-appearing or toxic-appearing.  HENT:     Head: Normocephalic.     Right Ear: Hearing, tympanic membrane, ear canal and external ear normal. Tympanic membrane is not erythematous, retracted or bulging.     Left Ear: Hearing, tympanic membrane, ear canal and external ear normal. Tympanic membrane is not erythematous, retracted or bulging.     Nose: Nose normal. No mucosal edema or rhinorrhea.     Right Sinus: No maxillary sinus tenderness or frontal sinus tenderness.     Left Sinus: No maxillary sinus tenderness or frontal sinus tenderness.     Mouth/Throat:     Pharynx: Uvula midline.  Eyes:     General: Lids are normal. Lids are everted, no foreign bodies appreciated.     Conjunctiva/sclera: Conjunctivae normal.     Pupils: Pupils are equal, round, and reactive to light.  Neck:     Thyroid: No thyroid mass or thyromegaly.     Vascular: No carotid bruit.     Trachea: Trachea normal.  Cardiovascular:     Rate and Rhythm: Normal rate and regular rhythm.     Pulses: Normal pulses.     Heart sounds: Normal heart sounds, S1 normal and S2 normal. No murmur heard.   No friction rub. No gallop.  Pulmonary:     Effort: Pulmonary effort is normal. No tachypnea or respiratory distress.     Breath sounds: Normal breath sounds. No decreased breath sounds, wheezing, rhonchi or rales.  Abdominal:     General: Bowel sounds are normal. There is no distension or abdominal bruit.     Palpations: Abdomen is soft. There is no fluid wave or mass.     Tenderness: There is no abdominal tenderness. There is no guarding or rebound.     Hernia: No hernia is present.  Musculoskeletal:      Cervical back: Normal range of motion and neck supple.  Lymphadenopathy:     Cervical: No cervical adenopathy.  Skin:    General: Skin is warm and dry.     Findings: No rash.  Neurological:     Mental Status: She is alert.     Cranial Nerves: No cranial nerve deficit.     Sensory: No sensory deficit.  Psychiatric:  Mood and Affect: Mood is not anxious or depressed.        Speech: Speech normal.        Behavior: Behavior normal. Behavior is cooperative.        Thought Content: Thought content normal.        Judgment: Judgment normal.      Results for orders placed or performed in visit on 06/25/21  Comprehensive metabolic panel  Result Value Ref Range   Sodium 137 135 - 145 mEq/L   Potassium 4.0 3.5 - 5.1 mEq/L   Chloride 100 96 - 112 mEq/L   CO2 29 19 - 32 mEq/L   Glucose, Bld 98 70 - 99 mg/dL   BUN 13 6 - 23 mg/dL   Creatinine, Ser 1.610.72 0.40 - 1.20 mg/dL   Total Bilirubin 0.5 0.2 - 1.2 mg/dL   Alkaline Phosphatase 60 39 - 117 U/L   AST 15 0 - 37 U/L   ALT 19 0 - 35 U/L   Total Protein 6.7 6.0 - 8.3 g/dL   Albumin 3.9 3.5 - 5.2 g/dL   GFR 09.6095.67 >45.40>60.00 mL/min   Calcium 9.2 8.4 - 10.5 mg/dL  Lipid panel  Result Value Ref Range   Cholesterol 207 (H) 0 - 200 mg/dL   Triglycerides 981.1136.0 0.0 - 149.0 mg/dL   HDL 91.4762.70 >82.95>39.00 mg/dL   VLDL 62.127.2 0.0 - 30.840.0 mg/dL   LDL Cholesterol 657117 (H) 0 - 99 mg/dL   Total CHOL/HDL Ratio 3    NonHDL 144.50      COVID 19 screen:  No recent travel or known exposure to COVID19 The patient denies respiratory symptoms of COVID 19 at this time. The importance of social distancing was discussed today.   Assessment and Plan   The patient's preventative maintenance and recommended screening tests for an annual wellness exam were reviewed in full today. Brought up to date unless services declined.  Counselled on the importance of diet, exercise, and its role in overall health and mortality. The patient's FH and SH was reviewed,  including their home life, tobacco status, and drug and alcohol status.   GYN Dr. Marcelle OverlieHolland   Vaccines:Tdap uptodate , refused flu, consider shingrix Pap/DVE:  2019 pap  GYN nml repeat 5 years Mammo:  Neg 09/2019.Marland Kitchen. done at GYN office.. due  Colon:  2016 neg.. repeat in 10 years. Dr. 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        High cholesterol    Well-controlled with diet.       HTN (hypertension)    Chronic, stable control on lisinopril/hydrochlorothiazide 20/12.5 mg p.o. daily.       Prediabetes    Well-controlled with diet       Other Visit Diagnoses     Routine general medical examination at a health care facility    -  Primary        Kerby NoraAmy Shilpa Bushee, MD

## 2021-09-03 NOTE — Assessment & Plan Note (Signed)
Well controlled with diet. 

## 2021-09-03 NOTE — Patient Instructions (Addendum)
Start regular exercise. Healthy eating. Consider Ozempic. Talk with GYN about hormones and menopausal symptoms.

## 2021-09-03 NOTE — Assessment & Plan Note (Signed)
Chronic, stable control on lisinopril/hydrochlorothiazide 20/12.5 mg p.o. daily.

## 2021-09-03 NOTE — Assessment & Plan Note (Addendum)
This continues to be an issue despite [facility].  It certainly may be due to menopausal syndrome.  She will discuss hormone replacement options with her gynecologist.  We did discuss cardiovascular risk associated with hormone replacement.  I encouraged her to work on increasing regular exercise and work on [employer] as this is likely to increase her energy

## 2021-09-03 NOTE — Assessment & Plan Note (Signed)
Encouraged exercise, weight loss, healthy eating habits. ? ?

## 2021-09-18 ENCOUNTER — Other Ambulatory Visit: Payer: Self-pay | Admitting: Family Medicine

## 2021-12-16 ENCOUNTER — Encounter: Payer: Self-pay | Admitting: Family Medicine

## 2022-01-05 ENCOUNTER — Other Ambulatory Visit: Payer: Self-pay | Admitting: Family Medicine

## 2022-01-14 ENCOUNTER — Ambulatory Visit: Payer: BC Managed Care – PPO | Admitting: Family Medicine

## 2022-01-14 ENCOUNTER — Encounter: Payer: Self-pay | Admitting: Family Medicine

## 2022-01-14 VITALS — BP 100/70 | HR 94 | Temp 98.9°F | Ht 65.5 in | Wt 237.5 lb

## 2022-01-14 DIAGNOSIS — Z77120 Contact with and (suspected) exposure to mold (toxic): Secondary | ICD-10-CM | POA: Diagnosis not present

## 2022-01-14 DIAGNOSIS — R0789 Other chest pain: Secondary | ICD-10-CM | POA: Diagnosis not present

## 2022-01-14 DIAGNOSIS — R55 Syncope and collapse: Secondary | ICD-10-CM | POA: Diagnosis not present

## 2022-01-14 DIAGNOSIS — J301 Allergic rhinitis due to pollen: Secondary | ICD-10-CM | POA: Insufficient documentation

## 2022-01-14 NOTE — Assessment & Plan Note (Signed)
We discussed possible allergic reaction to mold as a cause of her symptoms.  I do not have the ability to order micro toxin testing.  She will consider discussing this with her in her nose and throat doctor.  I have instructed her that she can start antihistamine such as Zyrtec or Xyzal to help mitigate any symptoms. She states that the school has removed the mold from the environment

## 2022-01-14 NOTE — Progress Notes (Signed)
Patient ID: Jane Li, female    DOB: [birth year], 51 y.o.   MRN: [id number]  This visit was conducted in person.  BP 100/70   Pulse 94   Temp 98.9 F (37.2 C) (Oral)   Ht 5' 5.5" (1.664 m)   Wt 237 lb 8 oz (107.7 kg)   SpO2 97%   BMI 38.92 kg/m    CC:  Chief Complaint  Patient presents with   Fatigue   Generalized Body Aches    "Bones Hurt"   Dizziness    "Feels like she is going to faint"   Nausea    More after she eats    Subjective:   HPI: Jane Li is a 51 y.o. female presenting on 01/14/2022 for Fatigue, Generalized Body Aches ("Bones Hurt"), Dizziness ("Feels like she is going to faint"), and Nausea (More after she eats)  She reports new onset  fatigue, she has been more emotional lately, increased stress in last several months   1 week ago she had new onset waves of weakness and presyncope.   On lisinopril HCTZ 20/12.5 mg daily... when feeling dizzy BP was 143/92 BP Readings from Last 3 Encounters:  01/14/22 100/70  09/03/21 114/72  02/04/21 133/88   Occ nonexertional chest pain. Nausea, more  after eating in last 1-2 months.  No clear heartburn.  She was around mold at school.. using nasocort.  She does not currently have menstrual cycles since her uterine ablation.     Relevant past medical, surgical, family and social history reviewed and updated as indicated. Interim medical history since our last visit reviewed. Allergies and medications reviewed and updated. Outpatient Medications Prior to Visit  Medication Sig Dispense Refill   diclofenac (VOLTAREN) 75 MG EC tablet Take 75 mg by mouth 2 (two) times daily.     famotidine (PEPCID) 20 MG tablet TAKE 1 TABLET BY MOUTH EVERYDAY AT BEDTIME 90 tablet 3   lisinopril-hydrochlorothiazide (ZESTORETIC) 20-12.5 MG tablet TAKE 1 TABLET BY MOUTH EVERY DAY 90 tablet 1   OIL OF OREGANO PO Take 3 drops by mouth daily.     OVER THE COUNTER MEDICATION Paraguard (wormwood,black walnut, black seed oil)  10 day course every three months     OVER THE COUNTER MEDICATION Doterra-Oils-Digestzen and Frankincense     triamcinolone (NASACORT) 55 MCG/ACT AERO nasal inhaler Place 2 sprays into the nose every evening.      No facility-administered medications prior to visit.     Per HPI unless specifically indicated in ROS section below Review of Systems  Constitutional:  Negative for fatigue and fever.  HENT:  Negative for congestion.   Eyes:  Negative for pain.  Respiratory:  Negative for cough and shortness of breath.   Cardiovascular:  Negative for chest pain, palpitations and leg swelling.  Gastrointestinal:  Negative for abdominal pain.  Genitourinary:  Negative for dysuria and vaginal bleeding.  Musculoskeletal:  Negative for back pain.  Neurological:  Negative for syncope, light-headedness and headaches.  Psychiatric/Behavioral:  Negative for dysphoric mood.    Objective:  BP 100/70   Pulse 94   Temp 98.9 F (37.2 C) (Oral)   Ht 5' 5.5" (1.664 m)   Wt 237 lb 8 oz (107.7 kg)   SpO2 97%   BMI 38.92 kg/m   Wt Readings from Last 3 Encounters:  01/14/22 237 lb 8 oz (107.7 kg)  09/03/21 242 lb (109.8 kg)  02/04/21 237 lb 2 oz (107.6 kg)  Physical Exam Vitals and nursing note reviewed.  Constitutional:      General: She is not in acute distress.    Appearance: Normal appearance. She is well-developed. She is not ill-appearing or toxic-appearing.  HENT:     Head: Normocephalic.     Right Ear: Hearing, tympanic membrane, ear canal and external ear normal.     Left Ear: Hearing, tympanic membrane, ear canal and external ear normal.     Nose: Nose normal.  Eyes:     General: Lids are normal. Lids are everted, no foreign bodies appreciated.     Conjunctiva/sclera: Conjunctivae normal.     Pupils: Pupils are equal, round, and reactive to light.  Neck:     Thyroid: No thyroid mass or thyromegaly.     Vascular: No carotid bruit.     Trachea: Trachea normal.  Cardiovascular:      Rate and Rhythm: Normal rate and regular rhythm.     Heart sounds: Normal heart sounds, S1 normal and S2 normal. No murmur heard.    No gallop.  Pulmonary:     Effort: Pulmonary effort is normal. No respiratory distress.     Breath sounds: Normal breath sounds. No wheezing, rhonchi or rales.  Abdominal:     General: Bowel sounds are normal. There is no distension or abdominal bruit.     Palpations: Abdomen is soft. There is no fluid wave or mass.     Tenderness: There is no abdominal tenderness. There is no guarding or rebound.     Hernia: No hernia is present.  Musculoskeletal:     Cervical back: Normal range of motion and neck supple.  Lymphadenopathy:     Cervical: No cervical adenopathy.  Skin:    General: Skin is warm and dry.     Findings: No rash.  Neurological:     Mental Status: She is alert.     Cranial Nerves: No cranial nerve deficit.     Sensory: No sensory deficit.  Psychiatric:        Mood and Affect: Mood is not anxious or depressed.        Speech: Speech normal.        Behavior: Behavior normal. Behavior is cooperative.        Judgment: Judgment normal.       Results for orders placed or performed in visit on 06/25/21  Comprehensive metabolic panel  Result Value Ref Range   Sodium 137 135 - 145 mEq/L   Potassium 4.0 3.5 - 5.1 mEq/L   Chloride 100 96 - 112 mEq/L   CO2 29 19 - 32 mEq/L   Glucose, Bld 98 70 - 99 mg/dL   BUN 13 6 - 23 mg/dL   Creatinine, Ser 6.22 0.40 - 1.20 mg/dL   Total Bilirubin 0.5 0.2 - 1.2 mg/dL   Alkaline Phosphatase 60 39 - 117 U/L   AST 15 0 - 37 U/L   ALT 19 0 - 35 U/L   Total Protein 6.7 6.0 - 8.3 g/dL   Albumin 3.9 3.5 - 5.2 g/dL   GFR 63.33 >54.56 mL/min   Calcium 9.2 8.4 - 10.5 mg/dL  Lipid panel  Result Value Ref Range   Cholesterol 207 (H) 0 - 200 mg/dL   Triglycerides 256.3 0.0 - 149.0 mg/dL   HDL 89.37 >34.28 mg/dL   VLDL 76.8 0.0 - 11.5 mg/dL   LDL Cholesterol 726 (H) 0 - 99 mg/dL   Total CHOL/HDL Ratio 3     NonHDL 144.50  COVID 19 screen:  No recent travel or known exposure to COVID19 The patient denies respiratory symptoms of COVID 19 at this time. The importance of social distancing was discussed today.   Assessment and Plan    Problem List Items Addressed This Visit     Atypical chest pain    EKG unremarkable and stable from previous, no sign of past heart attack, ongoing heart attack, arrhythmia or heart enlargement/LVH.  Her symptoms of chest pain are most likely due to pneumothorax.  She will start with using Pepcid AC 1-2 times a day as needed, if it becomes more frequent she can start proton pump inhibitors such as Prilosec      Contact with and (suspected) exposure to mold (toxic)    We discussed possible allergic reaction to mold as a cause of her symptoms.  I do not have the ability to order micro toxin testing.  She will consider discussing this with her in her nose and throat doctor.  I have instructed her that she can start antihistamine such as Zyrtec or Xyzal to help mitigate any symptoms. She states that the school has removed the mold from the environment      Pre-syncope - Primary    Unremarkable EKG, will evaluate with labs for secondary causes.  Her blood pressure is lower than usual today on the lisinopril hydrochlorothiazide but she states at home it has not been.  I encouraged her to keep up with water intake.      Relevant Orders   CBC with Differential/Platelet   Comprehensive metabolic panel   TSH   VITAMIN D 25 Hydroxy (Vit-D Deficiency, Fractures)   Vitamin B12   Estradiol   Follicle Stimulating Hormone   Luteinizing hormone   EKG 12-Lead (Completed)     Kerby Nora, MD

## 2022-01-14 NOTE — Assessment & Plan Note (Signed)
Unremarkable EKG, will evaluate with labs for secondary causes.  Her blood pressure is lower than usual today on the lisinopril hydrochlorothiazide but she states at home it has not been.  I encouraged her to keep up with water intake.

## 2022-01-14 NOTE — Assessment & Plan Note (Signed)
EKG unremarkable and stable from previous, no sign of past heart attack, ongoing heart attack, arrhythmia or heart enlargement/LVH.  Her symptoms of chest pain are most likely due to pneumothorax.  She will start with using Pepcid AC 1-2 times a day as needed, if it becomes more frequent she can start proton pump inhibitors such as Prilosec

## 2022-01-14 NOTE — Progress Notes (Signed)
No critical labs need to be addressed urgently. We will discuss labs in detail at upcoming office visit.   

## 2022-01-14 NOTE — Patient Instructions (Addendum)
Start  antihistamine like zyrtec/xyzal at bedtime.  Please stop at the lab to have labs drawn.

## 2022-01-15 LAB — CBC WITH DIFFERENTIAL/PLATELET
Absolute Monocytes: 666 cells/uL (ref 200–950)
Basophils Absolute: 39 cells/uL (ref 0–200)
Basophils Relative: 0.4 %
Eosinophils Absolute: 88 cells/uL (ref 15–500)
Eosinophils Relative: 0.9 %
HCT: 42.1 % (ref 35.0–45.0)
Hemoglobin: 14.3 g/dL (ref 11.7–15.5)
Lymphs Abs: 2587 cells/uL (ref 850–3900)
MCH: 32.2 pg (ref 27.0–33.0)
MCHC: 34 g/dL (ref 32.0–36.0)
MCV: 94.8 fL (ref 80.0–100.0)
MPV: 10.6 fL (ref 7.5–12.5)
Monocytes Relative: 6.8 %
Neutro Abs: 6419 cells/uL (ref 1500–7800)
Neutrophils Relative %: 65.5 %
Platelets: 289 10*3/uL (ref 140–400)
RBC: 4.44 10*6/uL (ref 3.80–5.10)
RDW: 13 % (ref 11.0–15.0)
Total Lymphocyte: 26.4 %
WBC: 9.8 10*3/uL (ref 3.8–10.8)

## 2022-01-15 LAB — COMPREHENSIVE METABOLIC PANEL
AG Ratio: 1.6 (calc) (ref 1.0–2.5)
ALT: 18 U/L (ref 6–29)
AST: 15 U/L (ref 10–35)
Albumin: 4.2 g/dL (ref 3.6–5.1)
Alkaline phosphatase (APISO): 67 U/L (ref 37–153)
BUN: 12 mg/dL (ref 7–25)
CO2: 29 mmol/L (ref 20–32)
Calcium: 10 mg/dL (ref 8.6–10.4)
Chloride: 102 mmol/L (ref 98–110)
Creat: 0.85 mg/dL (ref 0.50–1.03)
Globulin: 2.6 g/dL (calc) (ref 1.9–3.7)
Glucose, Bld: 85 mg/dL (ref 65–99)
Potassium: 4.1 mmol/L (ref 3.5–5.3)
Sodium: 140 mmol/L (ref 135–146)
Total Bilirubin: 0.3 mg/dL (ref 0.2–1.2)
Total Protein: 6.8 g/dL (ref 6.1–8.1)

## 2022-01-15 LAB — LUTEINIZING HORMONE: LH: 8.2 m[IU]/mL

## 2022-01-15 LAB — VITAMIN D 25 HYDROXY (VIT D DEFICIENCY, FRACTURES): Vit D, 25-Hydroxy: 20 ng/mL — ABNORMAL LOW (ref 30–100)

## 2022-01-15 LAB — ESTRADIOL: Estradiol: 61 pg/mL

## 2022-01-15 LAB — TSH: TSH: 1.69 mIU/L

## 2022-01-15 LAB — VITAMIN B12: Vitamin B-12: 375 pg/mL (ref 200–1100)

## 2022-01-15 LAB — FOLLICLE STIMULATING HORMONE: FSH: 15 m[IU]/mL

## 2022-01-18 ENCOUNTER — Other Ambulatory Visit: Payer: Self-pay | Admitting: Family Medicine

## 2022-01-18 MED ORDER — VITAMIN D (ERGOCALCIFEROL) 50000 UNITS PO CAPS: ORAL_CAPSULE | ORAL | 0 refills | Status: DC

## 2022-06-01 ENCOUNTER — Other Ambulatory Visit: Payer: Self-pay | Admitting: Neurosurgery

## 2022-06-01 DIAGNOSIS — M5416 Radiculopathy, lumbar region: Secondary | ICD-10-CM

## 2022-07-05 ENCOUNTER — Other Ambulatory Visit: Payer: Self-pay | Admitting: Family Medicine

## 2022-07-05 NOTE — Telephone Encounter (Signed)
Scheduled CPE as well as labs.

## 2022-07-05 NOTE — Telephone Encounter (Signed)
Please call and schedule CPE with fasting labs prior for Dr. Diona Browner after Sep 04, 2022.

## 2022-08-17 ENCOUNTER — Telehealth: Payer: Self-pay | Admitting: *Deleted

## 2022-08-17 DIAGNOSIS — R7303 Prediabetes: Secondary | ICD-10-CM

## 2022-08-17 DIAGNOSIS — E559 Vitamin D deficiency, unspecified: Secondary | ICD-10-CM

## 2022-08-17 DIAGNOSIS — I10 Essential (primary) hypertension: Secondary | ICD-10-CM

## 2022-08-17 DIAGNOSIS — E7800 Pure hypercholesterolemia, unspecified: Secondary | ICD-10-CM

## 2022-08-17 NOTE — Telephone Encounter (Signed)
-----   Message from Alvina Chou sent at 08/16/2022 12:26 PM EDT ----- Regarding: Lab orders for Tuesday, 5.21.24 Patient is scheduled for CPX labs, please order future labs, Thanks , Camelia Eng

## 2022-08-30 ENCOUNTER — Other Ambulatory Visit (INDEPENDENT_AMBULATORY_CARE_PROVIDER_SITE_OTHER): Payer: BC Managed Care – PPO

## 2022-08-30 DIAGNOSIS — E559 Vitamin D deficiency, unspecified: Secondary | ICD-10-CM

## 2022-08-30 DIAGNOSIS — E7800 Pure hypercholesterolemia, unspecified: Secondary | ICD-10-CM

## 2022-08-30 DIAGNOSIS — I10 Essential (primary) hypertension: Secondary | ICD-10-CM

## 2022-08-30 DIAGNOSIS — R7303 Prediabetes: Secondary | ICD-10-CM | POA: Diagnosis not present

## 2022-08-30 LAB — HEMOGLOBIN A1C: Hgb A1c MFr Bld: 5.8 % (ref 4.6–6.5)

## 2022-08-30 LAB — LIPID PANEL
Cholesterol: 193 mg/dL (ref 0–200)
HDL: 58.8 mg/dL (ref >39.00)
LDL Cholesterol: 114 mg/dL — ABNORMAL HIGH (ref 0–99)
NonHDL: 134
Total CHOL/HDL Ratio: 3
Triglycerides: 98 mg/dL (ref 0.0–149.0)
VLDL: 19.6 mg/dL (ref 0.0–40.0)

## 2022-08-30 LAB — COMPREHENSIVE METABOLIC PANEL
ALT: 28 U/L (ref 0–35)
AST: 21 U/L (ref 0–37)
Albumin: 3.9 g/dL (ref 3.5–5.2)
Alkaline Phosphatase: 59 U/L (ref 39–117)
BUN: 16 mg/dL (ref 6–23)
CO2: 28 mEq/L (ref 19–32)
Calcium: 9.1 mg/dL (ref 8.4–10.5)
Chloride: 100 mEq/L (ref 96–112)
Creatinine, Ser: 0.77 mg/dL (ref 0.40–1.20)
GFR: 87.54 mL/min (ref >60.00)
Glucose, Bld: 96 mg/dL (ref 70–99)
Potassium: 4 mEq/L (ref 3.5–5.1)
Sodium: 137 mEq/L (ref 135–145)
Total Bilirubin: 0.4 mg/dL (ref 0.2–1.2)
Total Protein: 6.6 g/dL (ref 6.0–8.3)

## 2022-08-30 LAB — VITAMIN D 25 HYDROXY (VIT D DEFICIENCY, FRACTURES): VITD: 34.07 ng/mL (ref 30.00–100.00)

## 2022-08-30 NOTE — Progress Notes (Signed)
No critical labs need to be addressed urgently. We will discuss labs in detail at upcoming office visit.   

## 2022-09-06 ENCOUNTER — Encounter: Payer: Self-pay | Admitting: Family Medicine

## 2022-09-06 ENCOUNTER — Ambulatory Visit (INDEPENDENT_AMBULATORY_CARE_PROVIDER_SITE_OTHER): Payer: BC Managed Care – PPO | Admitting: Family Medicine

## 2022-09-06 VITALS — BP 102/72 | HR 83 | Temp 98.1°F | Ht 65.0 in | Wt 243.0 lb

## 2022-09-06 DIAGNOSIS — E66812 Obesity, class 2: Secondary | ICD-10-CM

## 2022-09-06 DIAGNOSIS — Z0000 Encounter for general adult medical examination without abnormal findings: Secondary | ICD-10-CM

## 2022-09-06 DIAGNOSIS — R7303 Prediabetes: Secondary | ICD-10-CM

## 2022-09-06 DIAGNOSIS — E661 Drug-induced obesity: Secondary | ICD-10-CM

## 2022-09-06 DIAGNOSIS — Z6839 Body mass index (BMI) 39.0-39.9, adult: Secondary | ICD-10-CM

## 2022-09-06 DIAGNOSIS — E7800 Pure hypercholesterolemia, unspecified: Secondary | ICD-10-CM

## 2022-09-06 DIAGNOSIS — I10 Essential (primary) hypertension: Secondary | ICD-10-CM | POA: Diagnosis not present

## 2022-09-06 MED ORDER — LISINOPRIL-HYDROCHLOROTHIAZIDE 20-12.5 MG PO TABS: 1.0000 | ORAL_TABLET | Freq: Every day | ORAL | 3 refills | Status: DC

## 2022-09-06 MED ORDER — FAMOTIDINE 20 MG PO TABS: ORAL_TABLET | ORAL | 3 refills | Status: DC

## 2022-09-06 NOTE — Assessment & Plan Note (Signed)
Chronic, stable control on lisinopril/hydrochlorothiazide 20/12.5 mg p.o. daily. 

## 2022-09-06 NOTE — Assessment & Plan Note (Signed)
Well controlled with diet. 

## 2022-09-06 NOTE — Patient Instructions (Signed)
Work on exercise, weight loss, healthy eating habits.  

## 2022-09-06 NOTE — Progress Notes (Signed)
Patient ID: Lavonia Drafts, female    DOB: 1970/08/22, 52 y.o.   MRN: [id number]  This visit was conducted in person.  BP 102/72 (BP Location: Left Arm, Patient Position: Sitting, Cuff Size: Large)   Pulse 83   Temp 98.1 F (36.7 C) (Temporal)   Ht 5\' 5"  (1.651 m)   Wt 243 lb (110.2 kg)   SpO2 97%   BMI 40.44 kg/m    CC:  Chief Complaint  Patient presents with   Annual Exam                                                                                     Subjective:   HPI: TRESSY ROST is a 52 y.o. female presenting on 09/06/2022 for Annual Exam (                                                                             )   Hypertension:    Stable control on lisinopril HCTZ 20/12.5 mg daily BP Readings from Last 3 Encounters:  09/06/22 102/72  01/14/22 100/70  09/03/21 114/72  Using medication without problems or lightheadedness:  none Chest pain with exertion: none Edema: none Short of breath: none Average home BPs: Other issues:  Elevated Cholesterol:  Well controlled on  no medication.. increasing fish, protein shake. Lab Results  Component Value Date   CHOL 193 08/30/2022   HDL 58.80 08/30/2022   LDLCALC 114 (H) 08/30/2022   LDLDIRECT 146.6 05/06/2013   TRIG 98.0 08/30/2022   CHOLHDL 3 08/30/2022   The 10-year ASCVD risk score (Arnett DK, et al., 2019) is: 1.4%   Values used to calculate the score:     Age: 57 years     Sex: Female     Is Non-Hispanic African American: No     Diabetic: No     Tobacco smoker: No     Systolic Blood Pressure: 102 mmHg     Is BP treated: Yes     HDL Cholesterol: 58.8 mg/dL     Total Cholesterol: 193 mg/dL Using medications without problems: Muscle aches:  Diet compliance: moderate.. work in very stressful. Exercise:none Other complaints:   She has menopausal symptoms, fatigue, s/p ablation ( none) goes to bed a t 8 PM, sleeps 8 hours.   Prediabetes  Lab Results  Component Value Date   HGBA1C 5.8  08/30/2022   Wt Readings from Last 3 Encounters:  09/06/22 243 lb (110.2 kg)  01/14/22 237 lb 8 oz (107.7 kg)  09/03/21 242 lb (109.8 kg)    Body mass index is 40.44 kg/m.   [facility] Dr. Mayford Knife.  She is getting B12 infusions.    Relevant past medical, surgical, family and social history reviewed and updated as indicated. Interim medical history since our last visit reviewed. Allergies and  medications reviewed and updated. Outpatient Medications Prior to Visit  Medication Sig Dispense Refill   Cholecalciferol (VITAMIN D-3 PO) Take 1 Capful by mouth daily.     diclofenac (VOLTAREN) 75 MG EC tablet Take 75 mg by mouth 2 (two) times daily as needed.     famotidine (PEPCID) 20 MG tablet TAKE 1 TABLET BY MOUTH EVERYDAY AT BEDTIME 90 tablet 3   lisinopril-hydrochlorothiazide (ZESTORETIC) 20-12.5 MG tablet TAKE 1 TABLET BY MOUTH EVERY DAY 90 tablet 0   OIL OF OREGANO PO Take 3 drops by mouth daily.     OVER THE COUNTER MEDICATION Paraguard (wormwood,black walnut, black seed oil) 10 day course every three months     OVER THE COUNTER MEDICATION Doterra-Oils Frankincense, Tumeric, Ginger     OVER THE COUNTER MEDICATION Wild Yam Cream-apply morning and evening (balance hormones)     triamcinolone (NASACORT) 55 MCG/ACT AERO nasal inhaler Place 2 sprays into the nose every evening.      diclofenac (VOLTAREN) 75 MG EC tablet Take 75 mg by mouth 2 (two) times daily.     Vitamin D, Ergocalciferol, 50000 units CAPS 1 capsule weekly x 12 weeks 12 capsule 0   No facility-administered medications prior to visit.     Per HPI unless specifically indicated in ROS section below Review of Systems  Constitutional:  Negative for fatigue and fever.  HENT:  Negative for congestion.   Eyes:  Negative for pain.  Respiratory:  Negative for cough and shortness of breath.   Cardiovascular:  Negative for chest pain, palpitations and leg swelling.   Gastrointestinal:  Negative for abdominal pain.  Genitourinary:  Negative for dysuria and vaginal bleeding.  Musculoskeletal:  Negative for back pain.  Neurological:  Negative for syncope, light-headedness and headaches.  Psychiatric/Behavioral:  Negative for dysphoric mood.    Objective:  BP 102/72 (BP Location: Left Arm, Patient Position: Sitting, Cuff Size: Large)   Pulse 83   Temp 98.1 F (36.7 C) (Temporal)   Ht 5\' 5"  (1.651 m)   Wt 243 lb (110.2 kg)   SpO2 97%   BMI 40.44 kg/m   Wt Readings from Last 3 Encounters:  09/06/22 243 lb (110.2 kg)  01/14/22 237 lb 8 oz (107.7 kg)  09/03/21 242 lb (109.8 kg)      Physical Exam Vitals and nursing note reviewed.  Constitutional:      General: She is not in acute distress.    Appearance: Normal appearance. She is well-developed. She is obese. She is not ill-appearing or toxic-appearing.  HENT:     Head: Normocephalic.     Right Ear: Hearing, tympanic membrane, ear canal and external ear normal. Tympanic membrane is not erythematous, retracted or bulging.     Left Ear: Hearing, tympanic membrane, ear canal and external ear normal. Tympanic membrane is not erythematous, retracted or bulging.     Nose: Nose normal. No mucosal edema or rhinorrhea.     Right Sinus: No maxillary sinus tenderness or frontal sinus tenderness.     Left Sinus: No maxillary sinus tenderness or frontal sinus tenderness.     Mouth/Throat:     Pharynx: Uvula midline.  Eyes:     General: Lids are normal. Lids are everted, no foreign bodies appreciated.     Conjunctiva/sclera: Conjunctivae normal.     Pupils: Pupils are equal, round, and reactive to light.  Neck:     Thyroid: No thyroid mass or thyromegaly.     Vascular: No carotid bruit.  Trachea: Trachea normal.  Cardiovascular:     Rate and Rhythm: Normal rate and regular rhythm.     Pulses: Normal pulses.     Heart sounds: Normal heart sounds, S1 normal and S2 normal. No murmur heard.    No  friction rub. No gallop.  Pulmonary:     Effort: Pulmonary effort is normal. No tachypnea or respiratory distress.     Breath sounds: Normal breath sounds. No decreased breath sounds, wheezing, rhonchi or rales.  Abdominal:     General: Bowel sounds are normal. There is no distension or abdominal bruit.     Palpations: Abdomen is soft. There is no fluid wave or mass.     Tenderness: There is no abdominal tenderness. There is no guarding or rebound.     Hernia: No hernia is present.  Musculoskeletal:     Cervical back: Normal range of motion and neck supple.  Lymphadenopathy:     Cervical: No cervical adenopathy.  Skin:    General: Skin is warm and dry.     Findings: No rash.  Neurological:     Mental Status: She is alert.     Cranial Nerves: No cranial nerve deficit.     Sensory: No sensory deficit.  Psychiatric:        Mood and Affect: Mood is not anxious or depressed.        Speech: Speech normal.        Behavior: Behavior normal. Behavior is cooperative.        Thought Content: Thought content normal.        Judgment: Judgment normal.       Results for orders placed or performed in visit on 08/30/22  Hemoglobin A1c  Result Value Ref Range   Hgb A1c MFr Bld 5.8 4.6 - 6.5 %  VITAMIN D 25 Hydroxy (Vit-D Deficiency, Fractures)  Result Value Ref Range   VITD 34.07 30.00 - 100.00 ng/mL  Lipid panel  Result Value Ref Range   Cholesterol 193 0 - 200 mg/dL   Triglycerides 16.1 0.0 - 149.0 mg/dL   HDL 09.60 >45.40 mg/dL   VLDL 98.1 0.0 - 19.1 mg/dL   LDL Cholesterol 478 (H) 0 - 99 mg/dL   Total CHOL/HDL Ratio 3    NonHDL 134.00   Comprehensive metabolic panel  Result Value Ref Range   Sodium 137 135 - 145 mEq/L   Potassium 4.0 3.5 - 5.1 mEq/L   Chloride 100 96 - 112 mEq/L   CO2 28 19 - 32 mEq/L   Glucose, Bld 96 70 - 99 mg/dL   BUN 16 6 - 23 mg/dL   Creatinine, Ser 2.95 0.40 - 1.20 mg/dL   Total Bilirubin 0.4 0.2 - 1.2 mg/dL   Alkaline Phosphatase 59 39 - 117 U/L    AST 21 0 - 37 U/L   ALT 28 0 - 35 U/L   Total Protein 6.6 6.0 - 8.3 g/dL   Albumin 3.9 3.5 - 5.2 g/dL   GFR 62.13 >08.65 mL/min   Calcium 9.1 8.4 - 10.5 mg/dL     COVID 19 screen:  No recent travel or known exposure to COVID19 The patient denies respiratory symptoms of COVID 19 at this time. The importance of social distancing was discussed today.   Assessment and Plan   The patient's preventative maintenance and recommended screening tests for an annual wellness exam were reviewed in full today. Brought up to date unless services declined.  Counselled on the importance of diet, exercise,  and its role in overall health and mortality. The patient's FH and SH was reviewed, including their home life, tobacco status, and drug and alcohol status.   GYN Dr. Marcelle Overlie   Vaccines:Tdap uptodate , refused flu, consider shingrix Pap/DVE:  2019 pap  GYN nml repeat 5 years Mammo:  Neg 09/2019.Marland Kitchen done at GYN office.. requested Colon:  2016 neg.. repeat in 10 years. Dr. Larae Grooms Smoking Status:none ETOH/ drug use:  occ/none  HIV screen:  declined  Problem List Items Addressed This Visit   None   Kerby Nora, MD

## 2022-09-06 NOTE — Assessment & Plan Note (Signed)
Encouraged exercise, weight loss, healthy eating habits. ? ?

## 2022-12-15 ENCOUNTER — Other Ambulatory Visit: Payer: Self-pay | Admitting: Obstetrics and Gynecology

## 2022-12-15 DIAGNOSIS — R928 Other abnormal and inconclusive findings on diagnostic imaging of breast: Secondary | ICD-10-CM

## 2022-12-21 ENCOUNTER — Ambulatory Visit
Admission: RE | Admit: 2022-12-21 | Discharge: 2022-12-21 | Disposition: A | Discharge status: Home / Self Care | Payer: BC Managed Care – PPO | Source: Ambulatory Visit | Attending: Obstetrics and Gynecology | Admitting: Obstetrics and Gynecology

## 2022-12-21 DIAGNOSIS — R928 Other abnormal and inconclusive findings on diagnostic imaging of breast: Secondary | ICD-10-CM

## 2022-12-22 ENCOUNTER — Other Ambulatory Visit: Payer: Self-pay | Admitting: Obstetrics and Gynecology

## 2022-12-22 DIAGNOSIS — N6310 Unspecified lump in the right breast, unspecified quadrant: Secondary | ICD-10-CM

## 2022-12-26 ENCOUNTER — Other Ambulatory Visit: Payer: BC Managed Care – PPO

## 2022-12-27 ENCOUNTER — Ambulatory Visit
Admission: RE | Admit: 2022-12-27 | Discharge: 2022-12-27 | Disposition: A | Discharge status: Home / Self Care | Payer: BC Managed Care – PPO | Source: Ambulatory Visit | Attending: Obstetrics and Gynecology | Admitting: Obstetrics and Gynecology

## 2022-12-27 DIAGNOSIS — N6310 Unspecified lump in the right breast, unspecified quadrant: Secondary | ICD-10-CM

## 2022-12-27 HISTORY — PX: BREAST BIOPSY: SHX20

## 2023-01-06 DIAGNOSIS — C50911 Malignant neoplasm of unspecified site of right female breast: Secondary | ICD-10-CM | POA: Insufficient documentation

## 2023-03-30 DIAGNOSIS — C50811 Malignant neoplasm of overlapping sites of right female breast: Secondary | ICD-10-CM | POA: Insufficient documentation

## 2023-07-25 ENCOUNTER — Ambulatory Visit: Payer: Self-pay | Admitting: Family Medicine

## 2023-07-25 NOTE — Telephone Encounter (Signed)
 Attempt made to reach [facility] to inform of ER refusal from pt: no answer

## 2023-07-25 NOTE — Telephone Encounter (Signed)
 This RN made first attempt to contact pt after call dropped in middle of triage with no answer. A voicemail was left with call back number provided.    --------------- Intermittent dizziness x3 weeks  Symptoms: All intermittent- vertigo, ringing in ears, one ear loss of hearing, blurry/double vision out of left eye, half of face "kind of numb" Pertinent Negatives: Patient denies any current symptoms  Disposition: [x] ED   Additional Notes: Pt denies any current symptoms. Pt denies having this happen before. Pt states she did have a fall about 3-4 weeks ago and is not sure if this could be related. This RN recommended ED disposition and then the call dropped. This RN attempted to contact pt with no answer. LVM with call back number provided.    Copied from CRM [id number]. Topic: Clinical - Red Word Triage >> Jul 25, 2023  5:04 PM Felizardo Hotter wrote: Red Word that prompted transfer to Nurse Triage: Pt has blurry vision, ringing in ears, dizziness. Reason for Disposition  [1] Numbness (i.e., loss of sensation) of the face, arm / hand, or leg / foot on one side of the body AND [2] sudden onset AND [3] brief (now gone)  Protocols used: Neurologic Deficit-A-AH

## 2023-07-25 NOTE — Telephone Encounter (Signed)
 Called and spoke to patient: patient informed at present time she does not feel the need to go to the ER and therefore will not be going.  Patient states she really would like to make an appointment with PCP to follow up on this matter.  Nurse informed pt since there was a recommendation to go to the ER - I will not/am not going to override it, I will support that decision- however I can send a note to your PCP and make them aware of what is going on and also inform your PCP you would like to be scheduled an appt and please have someone from the office reach back out to you tomorrow.

## 2023-07-25 NOTE — Telephone Encounter (Signed)
2nd attempt.    Message left to call back.

## 2023-07-26 NOTE — Telephone Encounter (Signed)
 ED would be appropriate given her symptoms. Can we try to reach her again?   Can we confirm that all of her symptoms have resolved? If symptoms have resolved then okay to schedule office visit. Any new symptoms she needs ED evaluation.

## 2023-07-27 NOTE — Telephone Encounter (Signed)
Noted.    Sending to PCP as FYI.

## 2023-07-27 NOTE — Telephone Encounter (Signed)
 Noted.

## 2023-07-27 NOTE — Telephone Encounter (Signed)
 Called and spoke with patient, reviewed Jane Li message. Patient states her symptoms have currently resolved, she notes that they been intermittent for 3 weeks. Patient requested to make OV next available 4/21 @ 9am. Patient was advised if her symptoms return she needs to be evaluated by ED. Patient verbalized understanding.

## 2023-07-31 ENCOUNTER — Encounter: Payer: Self-pay | Admitting: Family

## 2023-07-31 ENCOUNTER — Ambulatory Visit (INDEPENDENT_AMBULATORY_CARE_PROVIDER_SITE_OTHER): Payer: Self-pay | Admitting: Family

## 2023-07-31 VITALS — BP 114/86 | HR 87 | Temp 98.0°F | Ht 65.0 in | Wt 232.8 lb

## 2023-07-31 DIAGNOSIS — E559 Vitamin D deficiency, unspecified: Secondary | ICD-10-CM | POA: Diagnosis not present

## 2023-07-31 DIAGNOSIS — R3915 Urgency of urination: Secondary | ICD-10-CM

## 2023-07-31 DIAGNOSIS — Z862 Personal history of diseases of the blood and blood-forming organs and certain disorders involving the immune mechanism: Secondary | ICD-10-CM

## 2023-07-31 DIAGNOSIS — J0100 Acute maxillary sinusitis, unspecified: Secondary | ICD-10-CM

## 2023-07-31 DIAGNOSIS — F411 Generalized anxiety disorder: Secondary | ICD-10-CM | POA: Diagnosis not present

## 2023-07-31 DIAGNOSIS — R5383 Other fatigue: Secondary | ICD-10-CM | POA: Diagnosis not present

## 2023-07-31 DIAGNOSIS — E7800 Pure hypercholesterolemia, unspecified: Secondary | ICD-10-CM

## 2023-07-31 DIAGNOSIS — R200 Anesthesia of skin: Secondary | ICD-10-CM

## 2023-07-31 DIAGNOSIS — R7303 Prediabetes: Secondary | ICD-10-CM

## 2023-07-31 DIAGNOSIS — H538 Other visual disturbances: Secondary | ICD-10-CM

## 2023-07-31 DIAGNOSIS — Z853 Personal history of malignant neoplasm of breast: Secondary | ICD-10-CM | POA: Insufficient documentation

## 2023-07-31 DIAGNOSIS — I10 Essential (primary) hypertension: Secondary | ICD-10-CM

## 2023-07-31 LAB — CBC WITH DIFFERENTIAL/PLATELET
Basophils Absolute: 0 10*3/uL (ref 0.0–0.1)
Basophils Relative: 1 % (ref 0.0–3.0)
Eosinophils Absolute: 0.1 10*3/uL (ref 0.0–0.7)
Eosinophils Relative: 1.5 % (ref 0.0–5.0)
HCT: 44.9 % (ref 36.0–46.0)
Hemoglobin: 15.2 g/dL — ABNORMAL HIGH (ref 12.0–15.0)
Lymphocytes Relative: 33.6 % (ref 12.0–46.0)
Lymphs Abs: 1.7 10*3/uL (ref 0.7–4.0)
MCHC: 33.9 g/dL (ref 30.0–36.0)
MCV: 92.1 fl (ref 78.0–100.0)
Monocytes Absolute: 0.4 10*3/uL (ref 0.1–1.0)
Monocytes Relative: 7.3 % (ref 3.0–12.0)
Neutro Abs: 2.8 10*3/uL (ref 1.4–7.7)
Neutrophils Relative %: 56.6 % (ref 43.0–77.0)
Platelets: 265 10*3/uL (ref 150.0–400.0)
RBC: 4.88 Mil/uL (ref 3.87–5.11)
RDW: 15 % (ref 11.5–15.5)
WBC: 4.9 10*3/uL (ref 4.0–10.5)

## 2023-07-31 LAB — BASIC METABOLIC PANEL WITH GFR
BUN: 14 mg/dL (ref 6–23)
CO2: 29 meq/L (ref 19–32)
Calcium: 9.6 mg/dL (ref 8.4–10.5)
Chloride: 101 meq/L (ref 96–112)
Creatinine, Ser: 0.71 mg/dL (ref 0.40–1.20)
GFR: 95.87 mL/min (ref >60.00)
Glucose, Bld: 99 mg/dL (ref 70–99)
Potassium: 4.1 meq/L (ref 3.5–5.1)
Sodium: 139 meq/L (ref 135–145)

## 2023-07-31 LAB — VITAMIN D 25 HYDROXY (VIT D DEFICIENCY, FRACTURES): VITD: 44.54 ng/mL (ref 30.00–100.00)

## 2023-07-31 LAB — TSH: TSH: 0.95 u[IU]/mL (ref 0.35–5.50)

## 2023-07-31 LAB — VITAMIN B12: Vitamin B-12: 435 pg/mL (ref 211–911)

## 2023-07-31 LAB — HEMOGLOBIN A1C: Hgb A1c MFr Bld: 6.1 % (ref 4.6–6.5)

## 2023-07-31 MED ORDER — PREDNISONE 10 MG (21) PO TBPK: ORAL_TABLET | ORAL | 0 refills | Status: DC

## 2023-07-31 MED ORDER — DEXAMETHASONE 1 MG PO TABS: ORAL_TABLET | ORAL | 0 refills | Status: DC

## 2023-07-31 MED ORDER — AMOXICILLIN-POT CLAVULANATE 875-125 MG PO TABS
1.0000 | ORAL_TABLET | Freq: Two times a day (BID) | ORAL | 0 refills | Status: DC
Start: 2023-07-31 — End: 2023-08-16

## 2023-07-31 NOTE — Assessment & Plan Note (Signed)
Pt advised of the following: Work on a diabetic diet, try to incorporate exercise at least 20-30 a day for 3 days a week or more.  Ordering A1c pending results. 

## 2023-07-31 NOTE — Progress Notes (Signed)
 Established Patient Office Visit  Subjective:   Patient ID: Jane Li, female    DOB: Nov 09, 1970  Age: 53 y.o. MRN: [id number]  CC:  Chief Complaint  Patient presents with   Dizziness    HPI: Jane Li is a 53 y.o. female presenting on 07/31/2023 for Dizziness  At times face will feel water logged. About one month ago fell at her daughters house doesn't think she hit her head, denies LOC, remembers at the end of the fall she rolled onto her back. Her fall was witnessed, she was walking her dog and she missed a step she was walking down and couldn't catch her fall. Had bruised her wrist and had some splinters in her right arm, but most has resolved since the fall.   She states she will have recurrent pains, not always in the same spot, and last for about half a minute, not daily, maybe a few times a week. She does report dizziness   Her last bout of vertigo was a few years ago, saw a chiropractor with relief.  No repeat episodes but she does state in the last month she feels unbalance, woozy and when she tilts her head back she feels dizzy. When she lies back in bed at night she feels dizzy as well. She does report some allergy symptoms, taking nasocort, watery itchy eyes, ears feeling clogged and full, PND and sneezing. She is not taking an otc allergy medication. Four days ago she did note some that her left eye had a 'static like line' in her line of vision, lasted for a few minutes but then went away. She states while that period was occurring she felt blurry vision on the lower part of her vision, this has not occurred since that one occurrence four days ago. She has had two episodes of the right side of her face upper eye to chin area that lasted momentarily maybe a few minutes and then went away, the last episode of this being about two weeks ago. It feels like her 'head is glitching'. She does note some sinus pressure. Has been checking her blood pressure at home and typically  less than 130/90. She is going to make an appt with her eye doctor as well to make sure all is well. She does state she has also noticed over the last one month tinnitus bil ears intermittently throughout the day. She does note a lot of increased stress that keeps increasing.   She does have a h/o right breast cancer, has had surgery and radiation.   Anxiety: her mom has h/o bipolar. Pt states does have cyclic h/o mood swings. She does state she has had three recent stressful events that she has noted to increased her stress levels. She also notices she cries more often which is unusual for her. She does pray and this helps her. She has thought about seeing a therapist but has not pursued this. Never has had to take medication for this. She has tried some otc herbals for this without improvement. She also states sleep has been off a bit due to a crazy schedule, waking up often as her dog had to go to the bathroom every night from 2-3 am. Pt is requesting cortisol level. No recent weight gain, no buffalo hump.     Does c/o urinary frequency and urgency.  No dysuria.  No vaginal discharge.       ROS: Negative unless specifically indicated above in HPI.   Relevant  past medical history reviewed and updated as indicated.   Allergies and medications reviewed and updated.   Current Outpatient Medications:    amoxicillin -clavulanate (AUGMENTIN ) 875-125 MG tablet, Take 1 tablet by mouth 2 (two) times daily., Disp: 20 tablet, Rfl: 0   Cholecalciferol (VITAMIN D -3 PO), Take 1 Capful by mouth daily., Disp: , Rfl:    dexamethasone  (DECADRON ) 1 MG tablet, Take one tablet between 11 pm and 12 am night before labs to be drawn in the am around 8 am, Disp: 1 tablet, Rfl: 0   diclofenac  (VOLTAREN ) 75 MG EC tablet, Take 75 mg by mouth 2 (two) times daily as needed., Disp: , Rfl:    famotidine  (PEPCID ) 20 MG tablet, TAKE 1 TABLET BY MOUTH EVERYDAY AT BEDTIME, Disp: 90 tablet, Rfl: 3    lisinopril -hydrochlorothiazide  (ZESTORETIC ) 20-12.5 MG tablet, Take 1 tablet by mouth daily., Disp: 90 tablet, Rfl: 3   OIL OF OREGANO PO, Take 3 drops by mouth daily., Disp: , Rfl:    predniSONE  (STERAPRED UNI-PAK 21 TAB) 10 MG (21) TBPK tablet, Take as directed, Disp: 1 each, Rfl: 0   triamcinolone  (NASACORT ) 55 MCG/ACT AERO nasal inhaler, Place 2 sprays into the nose every evening. , Disp: , Rfl:   Allergies  Allergen Reactions   Codeine Nausea And Vomiting    Objective:   BP 114/86 (BP Location: Left Arm, Patient Position: Sitting, Cuff Size: Large)   Pulse 87   Temp 98 F (36.7 C) (Temporal)   Ht 5\' 5"  (1.651 m)   Wt 232 lb 12.8 oz (105.6 kg)   SpO2 96%   BMI 38.74 kg/m    Physical Exam Constitutional:      General: She is not in acute distress.    Appearance: Normal appearance. She is normal weight. She is not ill-appearing, toxic-appearing or diaphoretic.  HENT:     Head: Normocephalic.     Right Ear: A middle ear effusion is present.     Left Ear: A middle ear effusion is present.     Nose: Congestion and rhinorrhea present.     Right Sinus: Maxillary sinus tenderness present.     Left Sinus: Maxillary sinus tenderness present.     Mouth/Throat:     Mouth: Mucous membranes are moist.     Pharynx: Postnasal drip present.     Tonsils: No tonsillar exudate.  Cardiovascular:     Rate and Rhythm: Normal rate.  Pulmonary:     Effort: Pulmonary effort is normal.  Musculoskeletal:        General: Normal range of motion.  Neurological:     General: No focal deficit present.     Mental Status: She is alert and oriented to person, place, and time. Mental status is at baseline.     Cranial Nerves: Cranial nerves 2-12 are intact. No cranial nerve deficit or facial asymmetry.     Sensory: Sensation is intact.     Motor: Motor function is intact.     Coordination: Coordination is intact.     Gait: Gait is intact.  Psychiatric:        Mood and Affect: Mood normal.         Behavior: Behavior normal.        Thought Content: Thought content normal.        Judgment: Judgment normal.     Assessment & Plan:  Prediabetes Assessment & Plan: Pt advised of the following: Work on a diabetic diet, try to incorporate exercise at least 20-30 a  day for 3 days a week or more.  Ordering A1c pending results  Orders: -     Basic metabolic panel with GFR -     Hemoglobin A1c  GAD (generalized anxiety disorder) -     Cortisol-am, blood; Future  History of anemia -     CBC with Differential/Platelet  Facial numbness Assessment & Plan: No recent episode in last one week however I do suspect could be correlated with sinus infection.  Did advise however of ER stroke precautions if this reoccurs.    Orders: -     Vitamin B12 -     Cortisol-am, blood; Future -     dexAMETHasone ; Take one tablet between 11 pm and 12 am night before labs to be drawn in the am around 8 am  Dispense: 1 tablet; Refill: 0  History of breast cancer in adulthood  Blurry vision Assessment & Plan: Pt requesting cortisol levels Did advise pt she does not meet symptom criteria necessary for testing for cushings however will order per pt request.  Did advise may not be covered with insurance, pt ok with this.  Ordered dexamethasone  1 mg to take between 11-12pm night before a 8 am blood draw of cortisol. Pending results.  Stat eval ophthalmology pt states she will call to make appt.   Orders: -     Cortisol-am, blood; Future -     dexAMETHasone ; Take one tablet between 11 pm and 12 am night before labs to be drawn in the am around 8 am  Dispense: 1 tablet; Refill: 0  Other fatigue -     TSH  Acute non-recurrent maxillary sinusitis Assessment & Plan: Prescription given for augmentin  875/125 mg po bid for ten days. Pt to continue tylenol /ibuprofen  prn sinus pain. Continue with humidifier prn and steam showers recommended as well. instructed If no symptom improvement in 48 hours please  f/u Rx prednisone  pack take as directed   Orders: -     predniSONE ; Take as directed  Dispense: 1 each; Refill: 0 -     Amoxicillin -Pot Clavulanate; Take 1 tablet by mouth 2 (two) times daily.  Dispense: 20 tablet; Refill: 0  Vitamin D  deficiency -     VITAMIN D  25 Hydroxy (Vit-D Deficiency, Fractures)  Urinary urgency -     Urinalysis w microscopic + reflex cultur  Other orders -     REFLEXIVE URINE CULTURE     Follow up plan: Return in about 2 weeks (around 08/14/2023) for follow up dizziness.  Felicita Horns, FNP

## 2023-07-31 NOTE — Patient Instructions (Addendum)
  Start some over the counter zyrtec  ------------------------------------  Please look into therapy  [email or web address] is a good resource  ------------------------------------  See ophthalmologist vs an optometrist

## 2023-08-01 ENCOUNTER — Encounter: Payer: Self-pay | Admitting: Family

## 2023-08-01 DIAGNOSIS — R5383 Other fatigue: Secondary | ICD-10-CM | POA: Insufficient documentation

## 2023-08-01 DIAGNOSIS — H538 Other visual disturbances: Secondary | ICD-10-CM | POA: Insufficient documentation

## 2023-08-01 DIAGNOSIS — J0100 Acute maxillary sinusitis, unspecified: Secondary | ICD-10-CM | POA: Insufficient documentation

## 2023-08-01 DIAGNOSIS — E559 Vitamin D deficiency, unspecified: Secondary | ICD-10-CM | POA: Insufficient documentation

## 2023-08-01 LAB — URINALYSIS W MICROSCOPIC + REFLEX CULTURE
Bacteria, UA: NONE SEEN /HPF
Bilirubin Urine: NEGATIVE
Glucose, UA: NEGATIVE
Hgb urine dipstick: NEGATIVE
Hyaline Cast: NONE SEEN /LPF
Ketones, ur: NEGATIVE
Leukocyte Esterase: NEGATIVE
Nitrites, Initial: NEGATIVE
Protein, ur: NEGATIVE
RBC / HPF: NONE SEEN /HPF (ref 0–2)
Specific Gravity, Urine: 1.022 (ref 1.001–1.035)
WBC, UA: NONE SEEN /HPF (ref 0–5)
pH: 6.5 (ref 5.0–8.0)

## 2023-08-01 LAB — NO CULTURE INDICATED

## 2023-08-01 NOTE — Assessment & Plan Note (Signed)
 Prescription given for augmentin  875/125 mg po bid for ten days. Pt to continue tylenol /ibuprofen  prn sinus pain. Continue with humidifier prn and steam showers recommended as well. instructed If no symptom improvement in 48 hours please f/u Rx prednisone  pack take as directed

## 2023-08-01 NOTE — Assessment & Plan Note (Signed)
 No recent episode in last one week however I do suspect could be correlated with sinus infection.  Did advise however of ER stroke precautions if this reoccurs.

## 2023-08-01 NOTE — Assessment & Plan Note (Signed)
 Pt requesting cortisol levels Did advise pt she does not meet symptom criteria necessary for testing for cushings however will order per pt request.  Did advise may not be covered with insurance, pt ok with this.  Ordered dexamethasone  1 mg to take between 11-12pm night before a 8 am blood draw of cortisol. Pending results.  Stat eval ophthalmology pt states she will call to make appt.

## 2023-08-01 NOTE — Assessment & Plan Note (Signed)
 Will order tsh and b12 R/o b12 def and or thyroid  disease.

## 2023-08-04 ENCOUNTER — Other Ambulatory Visit (INDEPENDENT_AMBULATORY_CARE_PROVIDER_SITE_OTHER)

## 2023-08-04 DIAGNOSIS — F411 Generalized anxiety disorder: Secondary | ICD-10-CM | POA: Diagnosis not present

## 2023-08-04 DIAGNOSIS — R200 Anesthesia of skin: Secondary | ICD-10-CM

## 2023-08-04 DIAGNOSIS — H538 Other visual disturbances: Secondary | ICD-10-CM

## 2023-08-05 LAB — CORTISOL-AM, BLOOD: Cortisol - AM: 1.4 ug/dL — ABNORMAL LOW

## 2023-08-07 ENCOUNTER — Encounter: Payer: Self-pay | Admitting: Family

## 2023-08-16 ENCOUNTER — Ambulatory Visit (INDEPENDENT_AMBULATORY_CARE_PROVIDER_SITE_OTHER): Admitting: Family Medicine

## 2023-08-16 ENCOUNTER — Encounter: Payer: Self-pay | Admitting: Family Medicine

## 2023-08-16 VITALS — BP 118/78 | HR 83 | Temp 98.9°F | Ht 65.0 in | Wt 234.0 lb

## 2023-08-16 DIAGNOSIS — R200 Anesthesia of skin: Secondary | ICD-10-CM

## 2023-08-16 DIAGNOSIS — C50811 Malignant neoplasm of overlapping sites of right female breast: Secondary | ICD-10-CM | POA: Diagnosis not present

## 2023-08-16 DIAGNOSIS — G43009 Migraine without aura, not intractable, without status migrainosus: Secondary | ICD-10-CM

## 2023-08-16 DIAGNOSIS — H539 Unspecified visual disturbance: Secondary | ICD-10-CM | POA: Diagnosis not present

## 2023-08-16 DIAGNOSIS — Z170 Estrogen receptor positive status [ER+]: Secondary | ICD-10-CM

## 2023-08-16 DIAGNOSIS — R42 Dizziness and giddiness: Secondary | ICD-10-CM

## 2023-08-16 DIAGNOSIS — D0510 Intraductal carcinoma in situ of unspecified breast: Secondary | ICD-10-CM | POA: Insufficient documentation

## 2023-08-16 NOTE — Assessment & Plan Note (Signed)
 She is feeling significantly better status post treatment with steroids and antibiotics for sinus issues. She feels as though she could be reaccumulating some mucus and still has slight dizziness.  We talked about starting Zyrtec or Claritin in, continue Nasacort  2 sprays per nostril daily.

## 2023-08-16 NOTE — Assessment & Plan Note (Signed)
 Chronic, prescription for massage therapy written given this helped significantly with headache treatment and prevention.

## 2023-08-16 NOTE — Assessment & Plan Note (Signed)
 Resolved

## 2023-08-16 NOTE — Assessment & Plan Note (Signed)
 Acute, she has had no further changes in vision. She has an appointment today for an ophthalmology exam that I encouraged her to keep.

## 2023-08-16 NOTE — Progress Notes (Signed)
 Patient ID: Jane Li, female    DOB: 07-May-1970, 53 y.o.   MRN: [id number]  This visit was conducted in person.  BP 118/78   Pulse 83   Temp 98.9 F (37.2 C) (Oral)   Ht 5\' 5"  (1.651 m)   Wt 234 lb (106.1 kg)   SpO2 96%   BMI 38.94 kg/m    CC:  Chief Complaint  Patient presents with   Dizziness    Follow up patient states that she is doing better but still has some dizziness    Subjective:   HPI: Jane Li is a 53 y.o. female presenting on 08/16/2023 for Dizziness (Follow up patient states that she is doing better but still has some dizziness)  She was seen recently by Felicita Horns on July 31, 2023 for dizziness. Lab evaluation showed normal B12, normal thyroid , normal vitamin D .   Urinalysis negative. Basic metabolic panel and CBC within normal limits.  Hemoglobin A1c in prediabetes range at 6.1.    She was treated for possible sinus infection with Augmentin , prednisone  taper.  Patient tested for Cushing's with post dexamethasone  cortisol level. Given blurry vision recommended ophthalmology evaluation.  Today she reports she has noted some improvement in dizziness... still there slightly.  Ear pressure, water logged face is better.  Using Nasocort for allergies.  Has appt with eye MD today.  Has not had any more " static like ine" in eye. Blurry below the eye... no longer then  Still some left eye dryness and achiness.    Has  Had DX o f breast  right .[facility] In last year.. S/P surgery and radiaiton. Considering Lotrozole.  Oncology in [place].  Relevant past medical, surgical, family and social history reviewed and updated as indicated. Interim medical history since our last visit reviewed. Allergies and medications reviewed and updated. Outpatient Medications Prior to Visit  Medication Sig Dispense Refill   Cholecalciferol (VITAMIN D3) 250 MCG (10000 UT) capsule Take 10,000 Units by mouth daily.     diclofenac  (VOLTAREN ) 75 MG EC tablet Take 75 mg by mouth  2 (two) times daily as needed.     famotidine  (PEPCID ) 20 MG tablet TAKE 1 TABLET BY MOUTH EVERYDAY AT BEDTIME 90 tablet 3   lisinopril -hydrochlorothiazide  (ZESTORETIC ) 20-12.5 MG tablet Take 1 tablet by mouth daily. 90 tablet 3   OIL OF OREGANO PO Take 3 drops by mouth daily.     triamcinolone  (NASACORT ) 55 MCG/ACT AERO nasal inhaler Place 2 sprays into the nose every evening.      amoxicillin -clavulanate (AUGMENTIN ) 875-125 MG tablet Take 1 tablet by mouth 2 (two) times daily. 20 tablet 0   Cholecalciferol (VITAMIN D -3 PO) Take 1 Capful by mouth daily.     dexamethasone  (DECADRON ) 1 MG tablet Take one tablet between 11 pm and 12 am night before labs to be drawn in the am around 8 am 1 tablet 0   predniSONE  (STERAPRED UNI-PAK 21 TAB) 10 MG (21) TBPK tablet Take as directed 1 each 0   No facility-administered medications prior to visit.     Per HPI unless specifically indicated in ROS section below Review of Systems Objective:  BP 118/78   Pulse 83   Temp 98.9 F (37.2 C) (Oral)   Ht 5\' 5"  (1.651 m)   Wt 234 lb (106.1 kg)   SpO2 96%   BMI 38.94 kg/m   Wt Readings from Last 3 Encounters:  08/16/23 234 lb (106.1 kg)  07/31/23 232 lb 12.8  oz (105.6 kg)  09/06/22 243 lb (110.2 kg)      Physical Exam    Results for orders placed or performed in visit on 08/04/23  Cortisol-am, blood   Collection Time: 08/04/23  7:42 AM  Result Value Ref Range   Cortisol - AM 1.4 (L) mcg/dL    Assessment and Plan  Dizziness Assessment & Plan: She is feeling significantly better status post treatment with steroids and antibiotics for sinus issues. She feels as though she could be reaccumulating some mucus and still has slight dizziness.  We talked about starting Zyrtec or Claritin in, continue Nasacort  2 sprays per nostril daily.   Vision changes Assessment & Plan: Acute, she has had no further changes in vision. She has an appointment today for an ophthalmology exam that I encouraged her  to keep.   Migraine without aura and without status migrainosus, not intractable Assessment & Plan: Chronic, prescription for massage therapy written given this helped significantly with headache treatment and prevention.   Malignant neoplasm of overlapping sites of right breast in female, estrogen receptor positive [facility]) Assessment & Plan: Followed by oncology at [facility].  She is status post surgical treatment, radiation and has decided to hold off on estrogen blocking therapy given risk of side effects and limited decrease in risk of breast cancer recurrence.   Facial numbness Assessment & Plan: Resolved.     Return in about 3 months (around 11/16/2023) for annual physical with fasting labs prior.   Herby Lolling, MD

## 2023-08-16 NOTE — Assessment & Plan Note (Signed)
 Followed by oncology at [facility].  She is status post surgical treatment, radiation and has decided to hold off on estrogen blocking therapy given risk of side effects and limited decrease in risk of breast cancer recurrence.

## 2023-09-25 LAB — HM MAMMOGRAPHY

## 2023-10-01 ENCOUNTER — Other Ambulatory Visit: Payer: Self-pay | Admitting: Family Medicine

## 2023-10-25 ENCOUNTER — Other Ambulatory Visit: Payer: Self-pay | Admitting: Family Medicine

## 2023-10-25 ENCOUNTER — Telehealth: Payer: Self-pay | Admitting: *Deleted

## 2023-10-25 DIAGNOSIS — E7800 Pure hypercholesterolemia, unspecified: Secondary | ICD-10-CM

## 2023-10-25 NOTE — Telephone Encounter (Signed)
 Please schedule (around 11/16/2023) for annual physical with fasting labs prior.

## 2023-10-25 NOTE — Telephone Encounter (Signed)
-----   Message from Harlene Du sent at 10/25/2023  4:01 PM EDT ----- Regarding: Lab Thurs 11/02/23 Hello,  Patient is coming in for CPE labs on Thursday 11/02/23. Can we get orders please.   Thanks

## 2023-10-25 NOTE — Telephone Encounter (Signed)
 Spoke to pt, sch cpe for 11/14/23

## 2023-11-02 ENCOUNTER — Other Ambulatory Visit (INDEPENDENT_AMBULATORY_CARE_PROVIDER_SITE_OTHER)

## 2023-11-02 ENCOUNTER — Ambulatory Visit: Payer: Self-pay | Admitting: Family Medicine

## 2023-11-02 DIAGNOSIS — E7800 Pure hypercholesterolemia, unspecified: Secondary | ICD-10-CM | POA: Diagnosis not present

## 2023-11-02 LAB — LIPID PANEL
Cholesterol: 228 mg/dL — ABNORMAL HIGH (ref 0–200)
HDL: 57 mg/dL (ref >39.00)
LDL Cholesterol: 146 mg/dL — ABNORMAL HIGH (ref 0–99)
NonHDL: 170.97
Total CHOL/HDL Ratio: 4
Triglycerides: 123 mg/dL (ref 0.0–149.0)
VLDL: 24.6 mg/dL (ref 0.0–40.0)

## 2023-11-02 LAB — COMPREHENSIVE METABOLIC PANEL WITH GFR
ALT: 21 U/L (ref 0–35)
AST: 20 U/L (ref 0–37)
Albumin: 4.2 g/dL (ref 3.5–5.2)
Alkaline Phosphatase: 74 U/L (ref 39–117)
BUN: 19 mg/dL (ref 6–23)
CO2: 29 meq/L (ref 19–32)
Calcium: 9.6 mg/dL (ref 8.4–10.5)
Chloride: 103 meq/L (ref 96–112)
Creatinine, Ser: 0.86 mg/dL (ref 0.40–1.20)
GFR: 76.03 mL/min (ref >60.00)
Glucose, Bld: 104 mg/dL — ABNORMAL HIGH (ref 70–99)
Potassium: 4.2 meq/L (ref 3.5–5.1)
Sodium: 140 meq/L (ref 135–145)
Total Bilirubin: 0.5 mg/dL (ref 0.2–1.2)
Total Protein: 7.2 g/dL (ref 6.0–8.3)

## 2023-11-02 NOTE — Progress Notes (Signed)
 No critical labs need to be addressed urgently. We will discuss labs in detail at upcoming office visit.

## 2023-11-14 ENCOUNTER — Encounter: Payer: Self-pay | Admitting: Family Medicine

## 2023-11-14 ENCOUNTER — Ambulatory Visit (INDEPENDENT_AMBULATORY_CARE_PROVIDER_SITE_OTHER): Admitting: Family Medicine

## 2023-11-14 VITALS — BP 100/68 | HR 86 | Temp 98.1°F | Ht 65.0 in | Wt 218.0 lb

## 2023-11-14 DIAGNOSIS — E7800 Pure hypercholesterolemia, unspecified: Secondary | ICD-10-CM | POA: Diagnosis not present

## 2023-11-14 DIAGNOSIS — I10 Essential (primary) hypertension: Secondary | ICD-10-CM | POA: Diagnosis not present

## 2023-11-14 DIAGNOSIS — Z0000 Encounter for general adult medical examination without abnormal findings: Secondary | ICD-10-CM

## 2023-11-14 DIAGNOSIS — E661 Drug-induced obesity: Secondary | ICD-10-CM

## 2023-11-14 DIAGNOSIS — E66812 Obesity, class 2: Secondary | ICD-10-CM | POA: Diagnosis not present

## 2023-11-14 DIAGNOSIS — Z6836 Body mass index (BMI) 36.0-36.9, adult: Secondary | ICD-10-CM

## 2023-11-14 NOTE — Progress Notes (Signed)
 Patient ID: Jane Li, female    DOB: 1971/02/13, 53 y.o.   MRN: [id number]  This visit was conducted in person.  BP 100/68   Pulse 86   Temp 98.1 F (36.7 C) (Temporal)   Ht 5' 5 (1.651 m)   Wt 218 lb (98.9 kg)   SpO2 98%   BMI 36.28 kg/m    CC:  Chief Complaint  Patient presents with   Annual Exam    Subjective:   HPI: Jane Li is a 53 y.o. female presenting on 11/14/2023 for Annual Exam   Hypertension:    Stable control on lisinopril  HCTZ 20/12.5 mg daily BP Readings from Last 3 Encounters:  11/14/23 100/68  08/16/23 118/78  07/31/23 114/86  Using medication without problems or lightheadedness:  none Chest pain with exertion: none Edema: none Short of breath: none Average home BPs: Other issues:  Elevated Cholesterol:  Well controlled on  no medication.. increasing fish, protein shake. Lab Results  Component Value Date   CHOL 228 (H) 11/02/2023   HDL 57.00 11/02/2023   LDLCALC 146 (H) 11/02/2023   LDLDIRECT 146.6 05/06/2013   TRIG 123.0 11/02/2023   CHOLHDL 4 11/02/2023   The 10-year ASCVD risk score (Arnett DK, et al., 2019) is: 1.8%   Values used to calculate the score:     Age: 56 years     Clincally relevant sex: Female     Is Non-Hispanic African American: No     Diabetic: No     Tobacco smoker: No     Systolic Blood Pressure: 100 mmHg     Is BP treated: Yes     HDL Cholesterol: 57 mg/dL     Total Cholesterol: 228 mg/dL Using medications without problems: Muscle aches:  Diet compliance: nutrition program for gut health and has 20 LBs. Exercise:none Other complaints:   She has menopausal symptoms, fatigue, s/p ablation ( none) goes to bed a t 8 PM, sleeps 8 hours.   Prediabetes  Lab Results  Component Value Date   HGBA1C 6.1 07/31/2023   Wt Readings from Last 3 Encounters:  11/14/23 218 lb (98.9 kg)  08/16/23 234 lb (106.1 kg)  07/31/23 232 lb 12.8 oz (105.6 kg)    Body mass index is 36.28 kg/m.    Relevant past  medical, surgical, family and social history reviewed and updated as indicated. Interim medical history since our last visit reviewed. Allergies and medications reviewed and updated. Outpatient Medications Prior to Visit  Medication Sig Dispense Refill   Cholecalciferol (VITAMIN D3) 250 MCG (10000 UT) capsule Take 10,000 Units by mouth daily.     diclofenac  (VOLTAREN ) 75 MG EC tablet Take 75 mg by mouth 2 (two) times daily as needed.     famotidine  (PEPCID ) 20 MG tablet TAKE 1 TABLET BY MOUTH EVERYDAY AT BEDTIME 90 tablet 0   lisinopril -hydrochlorothiazide  (ZESTORETIC ) 20-12.5 MG tablet TAKE 1 TABLET BY MOUTH EVERY DAY 90 tablet 0   OIL OF OREGANO PO Take 3 drops by mouth daily.     triamcinolone  (NASACORT ) 55 MCG/ACT AERO nasal inhaler Place 2 sprays into the nose every evening.      No facility-administered medications prior to visit.     Per HPI unless specifically indicated in ROS section below Review of Systems  Constitutional:  Negative for fatigue and fever.  HENT:  Negative for congestion.   Eyes:  Negative for pain.  Respiratory:  Negative for cough and shortness of breath.   Cardiovascular:  Negative for chest pain, palpitations and leg swelling.  Gastrointestinal:  Negative for abdominal pain.  Genitourinary:  Negative for dysuria and vaginal bleeding.  Musculoskeletal:  Negative for back pain.  Neurological:  Negative for syncope, light-headedness and headaches.  Psychiatric/Behavioral:  Negative for dysphoric mood.    Objective:  BP 100/68   Pulse 86   Temp 98.1 F (36.7 C) (Temporal)   Ht 5' 5 (1.651 m)   Wt 218 lb (98.9 kg)   SpO2 98%   BMI 36.28 kg/m   Wt Readings from Last 3 Encounters:  11/14/23 218 lb (98.9 kg)  08/16/23 234 lb (106.1 kg)  07/31/23 232 lb 12.8 oz (105.6 kg)      Physical Exam Vitals and nursing note reviewed.  Constitutional:      General: She is not in acute distress.    Appearance: Normal appearance. She is well-developed. She is  obese. She is not ill-appearing or toxic-appearing.  HENT:     Head: Normocephalic.     Right Ear: Hearing, tympanic membrane, ear canal and external ear normal. Tympanic membrane is not erythematous, retracted or bulging.     Left Ear: Hearing, tympanic membrane, ear canal and external ear normal. Tympanic membrane is not erythematous, retracted or bulging.     Nose: Nose normal. No mucosal edema or rhinorrhea.     Right Sinus: No maxillary sinus tenderness or frontal sinus tenderness.     Left Sinus: No maxillary sinus tenderness or frontal sinus tenderness.     Mouth/Throat:     Pharynx: Uvula midline.  Eyes:     General: Lids are normal. Lids are everted, no foreign bodies appreciated.     Conjunctiva/sclera: Conjunctivae normal.     Pupils: Pupils are equal, round, and reactive to light.  Neck:     Thyroid : No thyroid  mass or thyromegaly.     Vascular: No carotid bruit.     Trachea: Trachea normal.  Cardiovascular:     Rate and Rhythm: Normal rate and regular rhythm.     Pulses: Normal pulses.     Heart sounds: Normal heart sounds, S1 normal and S2 normal. No murmur heard.    No friction rub. No gallop.  Pulmonary:     Effort: Pulmonary effort is normal. No tachypnea or respiratory distress.     Breath sounds: Normal breath sounds. No decreased breath sounds, wheezing, rhonchi or rales.  Abdominal:     General: Bowel sounds are normal. There is no distension or abdominal bruit.     Palpations: Abdomen is soft. There is no fluid wave or mass.     Tenderness: There is no abdominal tenderness. There is no guarding or rebound.     Hernia: No hernia is present.  Musculoskeletal:     Cervical back: Normal range of motion and neck supple.  Lymphadenopathy:     Cervical: No cervical adenopathy.  Skin:    General: Skin is warm and dry.     Findings: No rash.  Neurological:     Mental Status: She is alert.     Cranial Nerves: No cranial nerve deficit.     Sensory: No sensory  deficit.  Psychiatric:        Mood and Affect: Mood is not anxious or depressed.        Speech: Speech normal.        Behavior: Behavior normal. Behavior is cooperative.        Thought Content: Thought content normal.  Judgment: Judgment normal.       Results for orders placed or performed in visit on 11/14/23  HM MAMMOGRAPHY   Collection Time: 09/25/23 12:00 AM  Result Value Ref Range   HM Mammogram 0-4 Bi-Rad 0-4 Bi-Rad, Self Reported Normal     COVID 19 screen:  No recent travel or known exposure to COVID19 The patient denies respiratory symptoms of COVID 19 at this time. The importance of social distancing was discussed today.   Assessment and Plan   The patient's preventative maintenance and recommended screening tests for an annual wellness exam were reviewed in full today. Brought up to date unless services declined.  Counselled on the importance of diet, exercise, and its role in overall health and mortality. The patient's FH and SH was reviewed, including their home life, tobacco status, and drug and alcohol status.   GYN Dr. Johnnye  Vaccines:Tdap uptodate , refused flu, consider shingrix Pap/DVE:  2019 pap  GYN nml repeat 5 years Mammo:  S/P breast cancer, ER positive, nml mammo  09/2023  Colon:  2016 neg.. repeat in 10 years. Dr. Jacob's Smoking Status:none ETOH/ drug use:  occ/none  HIV screen:  declined  Problem List Items Addressed This Visit     Class 2 drug-induced obesity with serious comorbidity and body mass index (BMI) of 36.0 to 36.9 in adult   Encouraged exercise, weight loss, healthy eating habits.       High cholesterol   Well-controlled with diet but trending up.  Discussed low cholesterol diet and repeat eval in 3 months.      HTN (hypertension)   Chronic, stable control on lisinopril /hydrochlorothiazide  20/12.5 mg p.o. daily.      Other Visit Diagnoses       Routine general medical examination at a health care facility    -   Primary       Greig Ring, MD

## 2023-11-14 NOTE — Assessment & Plan Note (Signed)
 Encouraged exercise, weight loss, healthy eating habits. ? ?

## 2023-11-14 NOTE — Assessment & Plan Note (Signed)
 Well-controlled with diet but trending up.  Discussed low cholesterol diet and repeat eval in 3 months.

## 2023-11-14 NOTE — Assessment & Plan Note (Signed)
Chronic, stable control on lisinopril/hydrochlorothiazide 20/12.5 mg p.o. daily. 

## 2023-11-14 NOTE — Progress Notes (Deleted)
 8 Staples removed from Left Leg.  Patient tolerated well.

## 2023-12-12 ENCOUNTER — Ambulatory Visit: Admitting: Podiatry

## 2023-12-12 DIAGNOSIS — M629 Disorder of muscle, unspecified: Secondary | ICD-10-CM

## 2023-12-12 DIAGNOSIS — M722 Plantar fascial fibromatosis: Secondary | ICD-10-CM | POA: Diagnosis not present

## 2023-12-12 NOTE — Progress Notes (Signed)
 Subjective:  Patient ID: Jane Li, female    DOB: [birth year],  MRN: [id number]  Chief Complaint  Patient presents with   Foot Pain    Left heel discomfort  Pt stated that she went to [facility] and had xrays nothing was broken     53 y.o. female presents with the above complaint.  Patient presents with left plantar fascial tear.  She states that it hurts with ambulation hurts with pressure she states she has played pickle ball and heard a little pop while she was playing.  She has not played in a while.  She went to [facility] no x-rays were broken pain scale 7 out of 10 dull aching nature denies any other acute complaints.   Review of Systems: Negative except as noted in the HPI. Denies N/V/F/Ch.  Past Medical History:  Diagnosis Date   Allergy    Chronic headaches    2/WEEK SINUS AND STRESS HEADACHES   Deviated nasal septum    GERD (gastroesophageal reflux disease)    Hyperlipidemia    Hypertension    CONTROLLED ON MEDS   Motion sickness    CAR, AMUSEMENT PARK   Nasal turbinate hypertrophy    NASAL OBSTRUCTION   Obesity    Shortness of breath dyspnea    ON EXERTION   Sinusitis, chronic     Current Outpatient Medications:    Cholecalciferol (VITAMIN D3) 250 MCG (10000 UT) capsule, Take 10,000 Units by mouth daily., Disp: , Rfl:    diclofenac  (VOLTAREN ) 75 MG EC tablet, Take 75 mg by mouth 2 (two) times daily as needed., Disp: , Rfl:    famotidine  (PEPCID ) 20 MG tablet, TAKE 1 TABLET BY MOUTH EVERYDAY AT BEDTIME, Disp: 90 tablet, Rfl: 0   lisinopril -hydrochlorothiazide  (ZESTORETIC ) 20-12.5 MG tablet, TAKE 1 TABLET BY MOUTH EVERY DAY, Disp: 90 tablet, Rfl: 0   OIL OF OREGANO PO, Take 3 drops by mouth daily., Disp: , Rfl:    triamcinolone  (NASACORT ) 55 MCG/ACT AERO nasal inhaler, Place 2 sprays into the nose every evening. , Disp: , Rfl:   Social History   Tobacco Use  Smoking Status Never  Smokeless Tobacco Never    Allergies  Allergen Reactions   Codeine  Nausea And Vomiting   Objective:  There were no vitals filed for this visit. There is no height or weight on file to calculate BMI. Constitutional Well developed. Well nourished.  Vascular Dorsalis pedis pulses palpable bilaterally. Posterior tibial pulses palpable bilaterally. Capillary refill normal to all digits.  No cyanosis or clubbing noted. Pedal hair growth normal.  Neurologic Normal speech. Oriented to person, place, and time. Epicritic sensation to light touch grossly present bilaterally.  Dermatologic Nails well groomed and normal in appearance. No open wounds. No skin lesions.  Orthopedic: Pain on palpation to the left heel.  Pain along the heel at the calcaneal tuber.  Pain along the course of the arch.  Not as tight plantar fascia noted.  Patient has pes cavus foot structure no pain at the Achilles tendon   Radiographs: None Assessment:   1. Plantar fasciitis of left foot   2. Nontraumatic tear of plantar fascia    Plan:  Patient was evaluated and treated and all questions answered.  Left plantar fascial tear partial - All questions and concerns were discussed with the patient in extensive detail - Given the amount of pain that she is having she would benefit from cam boot immobilization allow the soft tissue structure to heal appropriately she  states understanding - If she still has some residual pain we discussed steroid injection during next visit she states understanding.  No follow-ups on file.

## 2023-12-14 ENCOUNTER — Ambulatory Visit

## 2024-01-03 ENCOUNTER — Other Ambulatory Visit: Payer: Self-pay | Admitting: Family Medicine

## 2024-01-09 ENCOUNTER — Ambulatory Visit: Admitting: Podiatry

## 2024-01-09 DIAGNOSIS — M21962 Unspecified acquired deformity of left lower leg: Secondary | ICD-10-CM | POA: Diagnosis not present

## 2024-01-09 DIAGNOSIS — M21961 Unspecified acquired deformity of right lower leg: Secondary | ICD-10-CM

## 2024-01-09 DIAGNOSIS — M722 Plantar fascial fibromatosis: Secondary | ICD-10-CM

## 2024-01-09 NOTE — Progress Notes (Unsigned)
 Subjective:  Patient ID: Jane Li, female    DOB: 1971-01-03,  MRN: [id number]  No chief complaint on file.   53 y.o. female presents with the above complaint.  Patient presents with now follow-up from left plantar fascia tear she states that she did great in cam boot immobilization she would like to discuss next treatment plan.  She denies any other acute complaints.  She still experiencing some pain.   Review of Systems: Negative except as noted in the HPI. Denies N/V/F/Ch.  Past Medical History:  Diagnosis Date   Allergy    Chronic headaches    2/WEEK SINUS AND STRESS HEADACHES   Deviated nasal septum    GERD (gastroesophageal reflux disease)    Hyperlipidemia    Hypertension    CONTROLLED ON MEDS   Motion sickness    CAR, AMUSEMENT PARK   Nasal turbinate hypertrophy    NASAL OBSTRUCTION   Obesity    Shortness of breath dyspnea    ON EXERTION   Sinusitis, chronic     Current Outpatient Medications:    Cholecalciferol (VITAMIN D3) 250 MCG (10000 UT) capsule, Take 10,000 Units by mouth daily., Disp: , Rfl:    diclofenac  (VOLTAREN ) 75 MG EC tablet, Take 75 mg by mouth 2 (two) times daily as needed., Disp: , Rfl:    famotidine  (PEPCID ) 20 MG tablet, TAKE 1 TABLET BY MOUTH EVERYDAY AT BEDTIME, Disp: 90 tablet, Rfl: 0   lisinopril -hydrochlorothiazide  (ZESTORETIC ) 20-12.5 MG tablet, TAKE 1 TABLET BY MOUTH EVERY DAY, Disp: 90 tablet, Rfl: 3   OIL OF OREGANO PO, Take 3 drops by mouth daily., Disp: , Rfl:    triamcinolone  (NASACORT ) 55 MCG/ACT AERO nasal inhaler, Place 2 sprays into the nose every evening. , Disp: , Rfl:   Social History   Tobacco Use  Smoking Status Never  Smokeless Tobacco Never    Allergies  Allergen Reactions   Codeine Nausea And Vomiting   Objective:  There were no vitals filed for this visit. There is no height or weight on file to calculate BMI. Constitutional Well developed. Well nourished.  Vascular Dorsalis pedis pulses palpable  bilaterally. Posterior tibial pulses palpable bilaterally. Capillary refill normal to all digits.  No cyanosis or clubbing noted. Pedal hair growth normal.  Neurologic Normal speech. Oriented to person, place, and time. Epicritic sensation to light touch grossly present bilaterally.  Dermatologic Nails well groomed and normal in appearance. No open wounds. No skin lesions.  Orthopedic: Normal joint ROM without pain or crepitus bilaterally. No visible deformities. Tender to palpation at the calcaneal tuber left. No pain with calcaneal squeeze left. Ankle ROM diminished range of motion left. Silfverskiold Test: positive left.   Radiographs: T  Assessment:   1. Plantar fasciitis of left foot   2. Foot deformity, bilateral    Plan:  Patient was evaluated and treated and all questions answered.  Plantar Fasciitis, left - XR reviewed as above.  - Educated on icing and stretching. Instructions given.  - Injection delivered to the plantar fascia as below. - DME: Plantar fascial brace dispensed to support the medial longitudinal arch of the foot and offload pressure from the heel and prevent arch collapse during weightbearing - Pharmacologic management: None  Pes planovalgus/foot deformity -I explained to patient the etiology of pes planovalgus and relationship with heel pain/arch pain and various treatment options were discussed.  Given patient foot structure in the setting of heel pain/arch pain I believe patient will benefit from custom-made orthotics to help  control the hindfoot motion support the arch of the foot and take the stress away from arches.  Patient agrees with the plan like to proceed with orthotics -Patient was casted for orthotics   Procedure: Injection Tendon/Ligament Location: Left plantar fascia at the glabrous junction; medial approach. Skin Prep: alcohol Injectate: 0.5 cc 0.5% marcaine plain, 0.5 cc of 1% Lidocaine , 0.5 cc kenalog  10. Disposition: Patient  tolerated procedure well. Injection site dressed with a band-aid.  No follow-ups on file.

## 2024-01-21 ENCOUNTER — Other Ambulatory Visit: Payer: Self-pay | Admitting: Family Medicine

## 2024-02-06 ENCOUNTER — Ambulatory Visit (INDEPENDENT_AMBULATORY_CARE_PROVIDER_SITE_OTHER): Admitting: Podiatry

## 2024-02-06 DIAGNOSIS — M62462 Contracture of muscle, left lower leg: Secondary | ICD-10-CM | POA: Diagnosis not present

## 2024-02-06 DIAGNOSIS — M722 Plantar fascial fibromatosis: Secondary | ICD-10-CM | POA: Diagnosis not present

## 2024-02-06 NOTE — Progress Notes (Unsigned)
 Subjective:  Patient ID: Jane Li, female    DOB: 10-20-1970,  MRN: [id number]  Chief Complaint  Patient presents with   Plantar Fasciitis    53 y.o. female presents with the above complaint.  Patient presents for follow-up of left plantar fasciitis she states she is doing a lot better.  She still has some residual pain would like to discuss next treatment plan denies any other acute complaints   Review of Systems: Negative except as noted in the HPI. Denies N/V/F/Ch.  Past Medical History:  Diagnosis Date   Allergy    Chronic headaches    2/WEEK SINUS AND STRESS HEADACHES   Deviated nasal septum    GERD (gastroesophageal reflux disease)    Hyperlipidemia    Hypertension    CONTROLLED ON MEDS   Motion sickness    CAR, [employer]   Nasal turbinate hypertrophy    NASAL OBSTRUCTION   Obesity    Shortness of breath dyspnea    ON EXERTION   Sinusitis, chronic     Current Outpatient Medications:    Cholecalciferol (VITAMIN D3) 250 MCG (10000 UT) capsule, Take 10,000 Units by mouth daily., Disp: , Rfl:    diclofenac  (VOLTAREN ) 75 MG EC tablet, Take 75 mg by mouth 2 (two) times daily as needed., Disp: , Rfl:    famotidine  (PEPCID ) 20 MG tablet, TAKE 1 TABLET BY MOUTH EVERYDAY AT BEDTIME, Disp: 90 tablet, Rfl: 3   lisinopril -hydrochlorothiazide  (ZESTORETIC ) 20-12.5 MG tablet, TAKE 1 TABLET BY MOUTH EVERY DAY, Disp: 90 tablet, Rfl: 3   OIL OF OREGANO PO, Take 3 drops by mouth daily., Disp: , Rfl:    triamcinolone  (NASACORT ) 55 MCG/ACT AERO nasal inhaler, Place 2 sprays into the nose every evening. , Disp: , Rfl:   Social History   Tobacco Use  Smoking Status Never  Smokeless Tobacco Never    Allergies  Allergen Reactions   Codeine Nausea And Vomiting   Objective:  There were no vitals filed for this visit. There is no height or weight on file to calculate BMI. Constitutional Well developed. Well nourished.  Vascular Dorsalis pedis pulses palpable  bilaterally. Posterior tibial pulses palpable bilaterally. Capillary refill normal to all digits.  No cyanosis or clubbing noted. Pedal hair growth normal.  Neurologic Normal speech. Oriented to person, place, and time. Epicritic sensation to light touch grossly present bilaterally.  Dermatologic Nails well groomed and normal in appearance. No open wounds. No skin lesions.  Orthopedic: Normal joint ROM without pain or crepitus bilaterally. No visible deformities. Tender to palpation at the calcaneal tuber left. No pain with calcaneal squeeze left. Ankle ROM diminished range of motion left. Silfverskiold Test: positive left.   Radiographs: T  Assessment:   No diagnosis found.  Plan:  Patient was evaluated and treated and all questions answered.  Plantar Fasciitis, left with underlying gastrocnemius equinus - XR reviewed as above.  - Educated on icing and stretching. Instructions given.  - second Injection delivered to the plantar fascia as below. - DME: Plantar fascial brace dispensed to support the medial longitudinal arch of the foot and offload pressure from the heel and prevent arch collapse during weightbearing - Pharmacologic management: None  Pes planovalgus/foot deformity -I explained to patient the etiology of pes planovalgus and relationship with heel pain/arch pain and various treatment options were discussed.  Given patient foot structure in the setting of heel pain/arch pain I believe patient will benefit from custom-made orthotics to help control the hindfoot motion support the  arch of the foot and take the stress away from arches.  Patient agrees with the plan like to proceed with orthotics -Patient was casted for orthotics   Procedure: Injection Tendon/Ligament Location: Left plantar fascia at the glabrous junction; medial approach. Skin Prep: alcohol Injectate: 0.5 cc 0.5% marcaine plain, 0.5 cc of 1% Lidocaine , 0.5 cc kenalog  10. Disposition: Patient  tolerated procedure well. Injection site dressed with a band-aid.  No follow-ups on file.

## 2024-02-27 ENCOUNTER — Telehealth: Payer: Self-pay

## 2024-02-27 NOTE — Telephone Encounter (Signed)
 Called patient to schedule PUO appointment iun Dunbar. Patient was busy at the time and would like a call back later in the day to schedule the PUO appointment.

## 2024-03-15 ENCOUNTER — Ambulatory Visit: Admitting: Physician Assistant

## 2024-03-25 ENCOUNTER — Ambulatory Visit: Payer: Self-pay

## 2024-03-25 NOTE — Telephone Encounter (Signed)
 FYI Only or Action Required?: FYI only for provider: appointment scheduled on 03/26/24.  Patient was last seen in primary care on 11/14/2023 by Jane Greig BRAVO, MD.  Called Nurse Triage reporting Leg Pain.  Symptoms began a week ago.  Interventions attempted: Rest, hydration, or home remedies.  Symptoms are: unchanged.  Triage Disposition: See PCP When Office is Open (Within 3 Days)  Patient/caregiver understands and will follow disposition?: Yes   Copied from CRM #[id number]. Topic: Clinical - Red Word Triage >> Mar 25, 2024 11:36 AM Suzen RAMAN wrote: Red Word that prompted transfer to Nurse Triage: front of right leg, very painful and warm to the touch, requesting an appt Reason for Disposition  [1] MODERATE pain (e.g., interferes with normal activities, limping) AND [2] present > 3 days  Answer Assessment - Initial Assessment Questions Recent returned from [place] . On week ago on Sunday shin started hurting   1. ONSET: When did the pain start?      Sunday 2. LOCATION: Where is the pain located?      Right leg 3. PAIN: How bad is the pain?    (Scale 1-10; or mild, moderate, severe)      4. WORK OR EXERCISE: Has there been any recent work or exercise that involved this part of the body?      A lot of walking while in [place]  5. CAUSE: What do you think is causing the leg pain?     walking 6. OTHER SYMPTOMS: Do you have any other symptoms? (e.g., chest pain, back pain, breathing difficulty, swelling, rash, fever, numbness, weakness)     Warm to touch 7. PREGNANCY: Is there any chance you are pregnant? When was your last menstrual period?  Protocols used: Leg Pain-A-AH

## 2024-03-25 NOTE — Telephone Encounter (Signed)
 Next Appt With Family Medicine Darra Ring, MD) 03/26/2024 at 4:00 PM

## 2024-03-26 ENCOUNTER — Encounter: Payer: Self-pay | Admitting: Family Medicine

## 2024-03-26 ENCOUNTER — Ambulatory Visit: Admitting: Family Medicine

## 2024-03-26 VITALS — BP 120/80 | HR 77 | Temp 99.5°F | Ht 65.0 in | Wt 215.1 lb

## 2024-03-26 DIAGNOSIS — M79661 Pain in right lower leg: Secondary | ICD-10-CM

## 2024-03-26 DIAGNOSIS — M7989 Other specified soft tissue disorders: Secondary | ICD-10-CM

## 2024-03-26 MED ORDER — DICLOFENAC SODIUM 75 MG PO TBEC: 75.0000 mg | DELAYED_RELEASE_TABLET | Freq: Two times a day (BID) | ORAL | 0 refills | Status: DC

## 2024-03-26 NOTE — Assessment & Plan Note (Signed)
 Acute, differential diagnosis includes inflammation from shinsplints and continued use, versus less likely DVT versus cellulitis. No clear sign of cellulitis entry. Pain in shin as opposed to calf and has had rapid improvement in swelling with rest and ice so less likely DVT but given her prolonged drive will order stat venous Doppler to rule this out. She will continue to elevate, rest, ice and increase diclofenac  to 75 mg p.o. twice daily. If pain not improving and no clot seen we will consider prednisone  taper.  Return and ER precautions provided.

## 2024-03-26 NOTE — Addendum Note (Signed)
 Addended by: WENDELL ARLAND RAMAN on: 03/26/2024 04:45 PM   Modules accepted: Orders

## 2024-03-26 NOTE — Progress Notes (Signed)
 Patient ID: Jane Li, female    DOB: 1970-06-07, 53 y.o.   MRN: [id number]  This visit was conducted in person.  BP 120/80   Pulse 77   Temp 99.5 F (37.5 C) (Temporal)   Ht 5' 5 (1.651 m)   Wt 215 lb 2 oz (97.6 kg)   SpO2 100%   BMI 35.80 kg/m    CC:  Chief Complaint  Patient presents with   Shin Pain    Right-Painful/Discolored and Warm to the touch    Subjective:   HPI: Jane Li is a 53 y.o. female presenting on 03/26/2024 for Shin Pain (Right-Painful/Discolored and Warm to the touch)  New onset pain in right shin.. discolored and warm to the touch.  She noted this start  during [place] trip.Jane Li did a lot of power walking 20,000 steps per day.  ON day 2 of trip pain in shin started.. treated with ice... but kept walking. Has noted some redness in right lower leg.  Taped leg.. this helped some.  Did have 9 hour trip.Jane Li stopped every 3 hours.  Has now been icing and resting in last 2 days.  Has been using diclofenac  once a day.   IN last few days swelling decreased and warmth has improved.. no spreading of redness.         Relevant past medical, surgical, family and social history reviewed and updated as indicated. Interim medical history since our last visit reviewed. Allergies and medications reviewed and updated. Outpatient Medications Prior to Visit  Medication Sig Dispense Refill   Nutritional Supplements (NUTRITIONAL SUPPLEMENT PO) Take 2 capsules by mouth daily. Oregano/Black Seed Oil     Cholecalciferol (VITAMIN D3) 250 MCG (10000 UT) capsule Take 10,000 Units by mouth daily.     diclofenac  (VOLTAREN ) 75 MG EC tablet Take 75 mg by mouth 2 (two) times daily as needed.     famotidine  (PEPCID ) 20 MG tablet TAKE 1 TABLET BY MOUTH EVERYDAY AT BEDTIME 90 tablet 3   lisinopril -hydrochlorothiazide  (ZESTORETIC ) 20-12.5 MG tablet TAKE 1 TABLET BY MOUTH EVERY DAY 90 tablet 3   triamcinolone  (NASACORT ) 55 MCG/ACT AERO nasal inhaler Place 2 sprays into the  nose every evening.      OIL OF OREGANO PO Take 3 drops by mouth daily.     No facility-administered medications prior to visit.     Per HPI unless specifically indicated in ROS section below Review of Systems  Constitutional:  Negative for fatigue and fever.  HENT:  Negative for congestion.   Eyes:  Negative for pain.  Respiratory:  Negative for cough and shortness of breath.   Cardiovascular:  Positive for leg swelling. Negative for chest pain and palpitations.  Gastrointestinal:  Negative for abdominal pain.  Genitourinary:  Negative for dysuria and vaginal bleeding.  Musculoskeletal:  Negative for back pain.  Neurological:  Negative for syncope, light-headedness and headaches.  Psychiatric/Behavioral:  Negative for dysphoric mood.    Objective:  BP 120/80   Pulse 77   Temp 99.5 F (37.5 C) (Temporal)   Ht 5' 5 (1.651 m)   Wt 215 lb 2 oz (97.6 kg)   SpO2 100%   BMI 35.80 kg/m   Wt Readings from Last 3 Encounters:  03/26/24 215 lb 2 oz (97.6 kg)  11/14/23 218 lb (98.9 kg)  08/16/23 234 lb (106.1 kg)      Physical Exam Constitutional:      General: She is not in acute distress.    Appearance:  Normal appearance. She is well-developed. She is not ill-appearing or toxic-appearing.  HENT:     Head: Normocephalic.     Right Ear: Hearing, tympanic membrane, ear canal and external ear normal. Tympanic membrane is not erythematous, retracted or bulging.     Left Ear: Hearing, tympanic membrane, ear canal and external ear normal. Tympanic membrane is not erythematous, retracted or bulging.     Nose: No mucosal edema or rhinorrhea.     Right Sinus: No maxillary sinus tenderness or frontal sinus tenderness.     Left Sinus: No maxillary sinus tenderness or frontal sinus tenderness.     Mouth/Throat:     Pharynx: Uvula midline.  Eyes:     General: Lids are normal. Lids are everted, no foreign bodies appreciated.     Conjunctiva/sclera: Conjunctivae normal.     Pupils: Pupils  are equal, round, and reactive to light.  Neck:     Thyroid : No thyroid  mass or thyromegaly.     Vascular: No carotid bruit.     Trachea: Trachea normal.  Cardiovascular:     Rate and Rhythm: Normal rate and regular rhythm.     Pulses: Normal pulses.     Heart sounds: Normal heart sounds, S1 normal and S2 normal. No murmur heard.    No friction rub. No gallop.     Comments: Redness, slight warmth and tenderness to palpation over right anterior shin.. no calf tenderness Pulmonary:     Effort: Pulmonary effort is normal. No tachypnea or respiratory distress.     Breath sounds: Normal breath sounds. No decreased breath sounds, wheezing, rhonchi or rales.  Abdominal:     General: Bowel sounds are normal.     Palpations: Abdomen is soft.     Tenderness: There is no abdominal tenderness.  Musculoskeletal:     Cervical back: Normal range of motion and neck supple.  Skin:    General: Skin is warm and dry.     Findings: No rash.  Neurological:     Mental Status: She is alert.  Psychiatric:        Mood and Affect: Mood is not anxious or depressed.        Speech: Speech normal.        Behavior: Behavior normal. Behavior is cooperative.        Thought Content: Thought content normal.        Judgment: Judgment normal.       Results for orders placed or performed in visit on 11/14/23  [facility] MAMMOGRAPHY   Collection Time: 09/25/23 12:00 AM  Result Value Ref Range   [facility] Mammogram 0-4 Bi-Rad 0-4 Bi-Rad, Self Reported Normal    Assessment and Plan  Pain and swelling of right lower leg Assessment & Plan:  Acute, differential diagnosis includes inflammation from shinsplints and continued use, versus less likely DVT versus cellulitis. No clear sign of cellulitis entry. Pain in shin as opposed to calf and has had rapid improvement in swelling with rest and ice so less likely DVT but given her prolonged drive will order stat venous Doppler to rule this out. She will continue to elevate, rest,  ice and increase diclofenac  to 75 mg p.o. twice daily. If pain not improving and no clot seen we will consider prednisone  taper.  Return and ER precautions provided.   Orders: -     VAS US  LOWER EXTREMITY VENOUS (DVT); Future  Other orders -     Diclofenac  Sodium; Take 1 tablet (75 mg total) by mouth  2 (two) times daily.  Dispense: 30 tablet; Refill: 0    No follow-ups on file.   Greig Ring, MD

## 2024-03-27 ENCOUNTER — Ambulatory Visit (HOSPITAL_COMMUNITY)
Admission: RE | Admit: 2024-03-27 | Discharge: 2024-03-27 | Disposition: A | Discharge status: Home / Self Care | Source: Ambulatory Visit | Attending: Family Medicine | Admitting: Family Medicine

## 2024-03-27 DIAGNOSIS — M79661 Pain in right lower leg: Secondary | ICD-10-CM | POA: Diagnosis present

## 2024-03-27 DIAGNOSIS — M7989 Other specified soft tissue disorders: Secondary | ICD-10-CM | POA: Insufficient documentation

## 2024-03-28 ENCOUNTER — Other Ambulatory Visit

## 2024-03-28 ENCOUNTER — Ambulatory Visit: Payer: Self-pay | Admitting: Family Medicine

## 2024-03-28 DIAGNOSIS — R591 Generalized enlarged lymph nodes: Secondary | ICD-10-CM

## 2024-03-28 DIAGNOSIS — M79661 Pain in right lower leg: Secondary | ICD-10-CM

## 2024-03-28 DIAGNOSIS — R599 Enlarged lymph nodes, unspecified: Secondary | ICD-10-CM

## 2024-03-29 ENCOUNTER — Ambulatory Visit
Admission: RE | Admit: 2024-03-29 | Discharge: 2024-03-29 | Disposition: A | Discharge status: Home / Self Care | Source: Ambulatory Visit | Attending: Family Medicine | Admitting: Family Medicine

## 2024-03-29 ENCOUNTER — Other Ambulatory Visit

## 2024-03-29 ENCOUNTER — Telehealth (INDEPENDENT_AMBULATORY_CARE_PROVIDER_SITE_OTHER): Admitting: Family Medicine

## 2024-03-29 ENCOUNTER — Ambulatory Visit: Payer: Self-pay | Admitting: Family Medicine

## 2024-03-29 DIAGNOSIS — M7989 Other specified soft tissue disorders: Secondary | ICD-10-CM

## 2024-03-29 DIAGNOSIS — R599 Enlarged lymph nodes, unspecified: Secondary | ICD-10-CM | POA: Diagnosis present

## 2024-03-29 DIAGNOSIS — M79661 Pain in right lower leg: Secondary | ICD-10-CM

## 2024-03-29 LAB — BASIC METABOLIC PANEL WITH GFR
BUN: 15 mg/dL (ref 6–23)
CO2: 29 meq/L (ref 19–32)
Calcium: 9.4 mg/dL (ref 8.4–10.5)
Chloride: 101 meq/L (ref 96–112)
Creatinine, Ser: 0.72 mg/dL (ref 0.40–1.20)
GFR: 93.83 mL/min
Glucose, Bld: 89 mg/dL (ref 70–99)
Potassium: 3.7 meq/L (ref 3.5–5.1)
Sodium: 139 meq/L (ref 135–145)

## 2024-03-29 MED ORDER — IOHEXOL 300 MG/ML  SOLN
100.0000 mL | Freq: Once | INTRAMUSCULAR | Status: AC | PRN
  Administered 2024-03-29: 100 mL via INTRAVENOUS

## 2024-03-29 NOTE — Telephone Encounter (Signed)
 Spoke with Jon.  She will come to office now to get STAT BMET drawn.

## 2024-03-29 NOTE — Telephone Encounter (Signed)
 Please call pt to assist with making stat lab appt today for BMET prior to CT scheduled later today.

## 2024-04-02 ENCOUNTER — Ambulatory Visit: Admitting: Podiatry

## 2024-04-02 DIAGNOSIS — M21961 Unspecified acquired deformity of right lower leg: Secondary | ICD-10-CM

## 2024-04-02 DIAGNOSIS — M21962 Unspecified acquired deformity of left lower leg: Secondary | ICD-10-CM

## 2024-04-02 NOTE — Progress Notes (Signed)
 Orthotics were dispensed they are functioning well with no acute complaints.  Break-in period was discussed no complication noted

## 2024-04-12 ENCOUNTER — Other Ambulatory Visit: Payer: Self-pay | Admitting: Family Medicine

## 2024-04-12 NOTE — Telephone Encounter (Signed)
 Last office visit 04/05/2024 for shin pain.  Last refilled 03/26/2024 for #30 with no refills.  Next Appt: No future appointments.
